# Patient Record
Sex: Female | Born: 1945 | Race: White | Hispanic: No | Smoking: Never smoker
Health system: Southern US, Community
[De-identification: ages and names within clinical notes are randomized; demographics above are authoritative.]

## PROBLEM LIST (undated history)

## (undated) DIAGNOSIS — E119 Type 2 diabetes mellitus without complications: Secondary | ICD-10-CM

## (undated) DIAGNOSIS — C801 Malignant (primary) neoplasm, unspecified: Secondary | ICD-10-CM

## (undated) DIAGNOSIS — R51 Headache: Secondary | ICD-10-CM

## (undated) DIAGNOSIS — H3530 Unspecified macular degeneration: Secondary | ICD-10-CM

## (undated) DIAGNOSIS — M1990 Unspecified osteoarthritis, unspecified site: Secondary | ICD-10-CM

## (undated) DIAGNOSIS — I10 Essential (primary) hypertension: Secondary | ICD-10-CM

## (undated) DIAGNOSIS — K219 Gastro-esophageal reflux disease without esophagitis: Secondary | ICD-10-CM

## (undated) DIAGNOSIS — R519 Headache, unspecified: Secondary | ICD-10-CM

## (undated) DIAGNOSIS — C50919 Malignant neoplasm of unspecified site of unspecified female breast: Secondary | ICD-10-CM

## (undated) DIAGNOSIS — Z9221 Personal history of antineoplastic chemotherapy: Secondary | ICD-10-CM

## (undated) HISTORY — PX: PALATE / UVULA BIOPSY / EXCISION: SUR128

## (undated) HISTORY — DX: Gastro-esophageal reflux disease without esophagitis: K21.9

## (undated) HISTORY — DX: Essential (primary) hypertension: I10

## (undated) HISTORY — DX: Unspecified macular degeneration: H35.30

## (undated) HISTORY — PX: US CAROTID DOPPLER BILATERAL (ARMC HX): HXRAD1402

## (undated) HISTORY — PX: MASTECTOMY: SHX3

## (undated) HISTORY — DX: Malignant (primary) neoplasm, unspecified: C80.1

## (undated) HISTORY — PX: NECK SURGERY: SHX720

## (undated) HISTORY — DX: Unspecified osteoarthritis, unspecified site: M19.90

## (undated) HISTORY — PX: DILATION AND CURETTAGE OF UTERUS: SHX78

---

## 1983-07-04 DIAGNOSIS — C50919 Malignant neoplasm of unspecified site of unspecified female breast: Secondary | ICD-10-CM

## 1983-07-04 HISTORY — DX: Malignant neoplasm of unspecified site of unspecified female breast: C50.919

## 2004-12-16 ENCOUNTER — Ambulatory Visit: Payer: Self-pay | Admitting: Family Medicine

## 2005-01-27 ENCOUNTER — Ambulatory Visit: Payer: Self-pay | Admitting: Gastroenterology

## 2005-02-16 ENCOUNTER — Ambulatory Visit: Payer: Self-pay | Admitting: Gastroenterology

## 2006-07-19 DIAGNOSIS — Z86010 Personal history of colonic polyps: Secondary | ICD-10-CM | POA: Insufficient documentation

## 2006-08-15 ENCOUNTER — Ambulatory Visit: Payer: Self-pay | Admitting: Family Medicine

## 2006-10-08 DIAGNOSIS — M129 Arthropathy, unspecified: Secondary | ICD-10-CM | POA: Insufficient documentation

## 2007-06-10 ENCOUNTER — Ambulatory Visit: Payer: Self-pay | Admitting: Family Medicine

## 2007-08-19 ENCOUNTER — Ambulatory Visit: Payer: Self-pay | Admitting: Family Medicine

## 2008-09-21 ENCOUNTER — Ambulatory Visit: Payer: Self-pay | Admitting: Family Medicine

## 2008-09-21 DIAGNOSIS — E559 Vitamin D deficiency, unspecified: Secondary | ICD-10-CM | POA: Insufficient documentation

## 2010-02-02 ENCOUNTER — Ambulatory Visit: Payer: Self-pay | Admitting: Family Medicine

## 2010-02-23 ENCOUNTER — Ambulatory Visit: Payer: Self-pay | Admitting: Gastroenterology

## 2010-02-23 LAB — HM COLONOSCOPY

## 2010-02-25 LAB — PATHOLOGY REPORT

## 2011-02-08 ENCOUNTER — Ambulatory Visit: Payer: Self-pay | Admitting: Family Medicine

## 2011-02-28 ENCOUNTER — Ambulatory Visit: Payer: Self-pay | Admitting: Family Medicine

## 2011-10-05 DIAGNOSIS — H6690 Otitis media, unspecified, unspecified ear: Secondary | ICD-10-CM | POA: Diagnosis not present

## 2011-10-05 DIAGNOSIS — Z1331 Encounter for screening for depression: Secondary | ICD-10-CM | POA: Diagnosis not present

## 2011-10-05 DIAGNOSIS — J069 Acute upper respiratory infection, unspecified: Secondary | ICD-10-CM | POA: Diagnosis not present

## 2011-10-27 DIAGNOSIS — H6980 Other specified disorders of Eustachian tube, unspecified ear: Secondary | ICD-10-CM | POA: Diagnosis not present

## 2011-10-27 DIAGNOSIS — H6500 Acute serous otitis media, unspecified ear: Secondary | ICD-10-CM | POA: Diagnosis not present

## 2011-11-17 DIAGNOSIS — H6980 Other specified disorders of Eustachian tube, unspecified ear: Secondary | ICD-10-CM | POA: Diagnosis not present

## 2012-03-12 LAB — LIPID PANEL
Cholesterol: 174 mg/dL (ref 0–200)
HDL: 62 mg/dL (ref 35–70)
LDL CALC: 99 mg/dL
LDl/HDL Ratio: 1.6
TRIGLYCERIDES: 60 mg/dL (ref 40–160)

## 2012-03-12 LAB — BASIC METABOLIC PANEL
BUN: 21 mg/dL (ref 4–21)
Creatinine: 0.8 mg/dL (ref <=1.1)
GLUCOSE: 112 mg/dL
Potassium: 5.2 mmol/L (ref 3.4–5.3)
SODIUM: 141 mmol/L (ref 137–147)

## 2012-03-12 LAB — CBC AND DIFFERENTIAL
HEMATOCRIT: 45 % (ref 36–46)
Hemoglobin: 15.1 g/dL (ref 12.0–16.0)
NEUTROS ABS: 54 /uL
PLATELETS: 214 10*3/uL (ref 150–399)
WBC: 6.1 10*3/mL

## 2012-03-12 LAB — HEPATIC FUNCTION PANEL
ALT: 20 U/L (ref 7–35)
AST: 22 U/L (ref 13–35)
Alkaline Phosphatase: 83 U/L (ref 25–125)
BILIRUBIN, TOTAL: 0.3 mg/dL

## 2012-03-12 LAB — TSH: TSH: 4.22 u[IU]/mL (ref <=5.90)

## 2012-03-13 DIAGNOSIS — R002 Palpitations: Secondary | ICD-10-CM | POA: Diagnosis not present

## 2012-03-13 DIAGNOSIS — R55 Syncope and collapse: Secondary | ICD-10-CM | POA: Diagnosis not present

## 2012-03-26 ENCOUNTER — Ambulatory Visit: Payer: Self-pay | Admitting: Family Medicine

## 2012-03-28 DIAGNOSIS — R002 Palpitations: Secondary | ICD-10-CM | POA: Diagnosis not present

## 2012-05-08 DIAGNOSIS — E559 Vitamin D deficiency, unspecified: Secondary | ICD-10-CM | POA: Diagnosis not present

## 2012-05-08 DIAGNOSIS — Z853 Personal history of malignant neoplasm of breast: Secondary | ICD-10-CM | POA: Diagnosis not present

## 2012-05-08 DIAGNOSIS — R55 Syncope and collapse: Secondary | ICD-10-CM | POA: Diagnosis not present

## 2012-05-08 DIAGNOSIS — M129 Arthropathy, unspecified: Secondary | ICD-10-CM | POA: Diagnosis not present

## 2014-01-30 DIAGNOSIS — H6120 Impacted cerumen, unspecified ear: Secondary | ICD-10-CM | POA: Diagnosis not present

## 2014-01-30 DIAGNOSIS — H93299 Other abnormal auditory perceptions, unspecified ear: Secondary | ICD-10-CM | POA: Diagnosis not present

## 2014-01-30 DIAGNOSIS — H6980 Other specified disorders of Eustachian tube, unspecified ear: Secondary | ICD-10-CM | POA: Diagnosis not present

## 2014-02-19 LAB — HM PAP SMEAR: HM PAP: NORMAL

## 2014-03-31 ENCOUNTER — Ambulatory Visit: Payer: Self-pay | Admitting: Family Medicine

## 2014-03-31 DIAGNOSIS — M899 Disorder of bone, unspecified: Secondary | ICD-10-CM | POA: Diagnosis not present

## 2014-03-31 DIAGNOSIS — M949 Disorder of cartilage, unspecified: Secondary | ICD-10-CM | POA: Diagnosis not present

## 2014-03-31 DIAGNOSIS — Z13820 Encounter for screening for osteoporosis: Secondary | ICD-10-CM | POA: Diagnosis not present

## 2014-03-31 LAB — HM DEXA SCAN

## 2014-04-07 ENCOUNTER — Ambulatory Visit: Payer: Self-pay | Admitting: Family Medicine

## 2014-04-07 LAB — HM MAMMOGRAPHY: HM Mammogram: NORMAL

## 2014-04-22 ENCOUNTER — Ambulatory Visit: Payer: Self-pay | Admitting: Family Medicine

## 2014-04-22 DIAGNOSIS — M79671 Pain in right foot: Secondary | ICD-10-CM | POA: Diagnosis not present

## 2014-04-22 DIAGNOSIS — M722 Plantar fascial fibromatosis: Secondary | ICD-10-CM | POA: Diagnosis not present

## 2014-04-22 DIAGNOSIS — K219 Gastro-esophageal reflux disease without esophagitis: Secondary | ICD-10-CM | POA: Diagnosis not present

## 2014-05-05 DIAGNOSIS — C50919 Malignant neoplasm of unspecified site of unspecified female breast: Secondary | ICD-10-CM | POA: Diagnosis not present

## 2014-05-05 DIAGNOSIS — M728 Other fibroblastic disorders: Secondary | ICD-10-CM | POA: Diagnosis not present

## 2014-05-19 DIAGNOSIS — M65872 Other synovitis and tenosynovitis, left ankle and foot: Secondary | ICD-10-CM | POA: Diagnosis not present

## 2014-05-19 DIAGNOSIS — C50919 Malignant neoplasm of unspecified site of unspecified female breast: Secondary | ICD-10-CM | POA: Insufficient documentation

## 2014-05-19 DIAGNOSIS — M76821 Posterior tibial tendinitis, right leg: Secondary | ICD-10-CM | POA: Diagnosis not present

## 2014-05-19 DIAGNOSIS — M722 Plantar fascial fibromatosis: Secondary | ICD-10-CM | POA: Insufficient documentation

## 2014-05-19 DIAGNOSIS — Z8669 Personal history of other diseases of the nervous system and sense organs: Secondary | ICD-10-CM | POA: Insufficient documentation

## 2014-06-09 DIAGNOSIS — M79671 Pain in right foot: Secondary | ICD-10-CM | POA: Diagnosis not present

## 2014-06-09 DIAGNOSIS — M76821 Posterior tibial tendinitis, right leg: Secondary | ICD-10-CM | POA: Diagnosis not present

## 2014-06-09 DIAGNOSIS — M65872 Other synovitis and tenosynovitis, left ankle and foot: Secondary | ICD-10-CM | POA: Diagnosis not present

## 2014-06-30 DIAGNOSIS — H25011 Cortical age-related cataract, right eye: Secondary | ICD-10-CM | POA: Diagnosis not present

## 2014-06-30 DIAGNOSIS — H2511 Age-related nuclear cataract, right eye: Secondary | ICD-10-CM | POA: Diagnosis not present

## 2014-06-30 DIAGNOSIS — H02839 Dermatochalasis of unspecified eye, unspecified eyelid: Secondary | ICD-10-CM | POA: Diagnosis not present

## 2014-06-30 DIAGNOSIS — H18411 Arcus senilis, right eye: Secondary | ICD-10-CM | POA: Diagnosis not present

## 2014-06-30 DIAGNOSIS — H3531 Nonexudative age-related macular degeneration: Secondary | ICD-10-CM | POA: Diagnosis not present

## 2014-07-01 DIAGNOSIS — M65872 Other synovitis and tenosynovitis, left ankle and foot: Secondary | ICD-10-CM | POA: Diagnosis not present

## 2014-07-01 DIAGNOSIS — M79671 Pain in right foot: Secondary | ICD-10-CM | POA: Diagnosis not present

## 2014-07-22 DIAGNOSIS — M65871 Other synovitis and tenosynovitis, right ankle and foot: Secondary | ICD-10-CM | POA: Diagnosis not present

## 2014-08-24 DIAGNOSIS — H2512 Age-related nuclear cataract, left eye: Secondary | ICD-10-CM | POA: Diagnosis not present

## 2014-08-24 DIAGNOSIS — H2513 Age-related nuclear cataract, bilateral: Secondary | ICD-10-CM | POA: Diagnosis not present

## 2014-08-24 DIAGNOSIS — H25812 Combined forms of age-related cataract, left eye: Secondary | ICD-10-CM | POA: Diagnosis not present

## 2014-09-04 DIAGNOSIS — H2511 Age-related nuclear cataract, right eye: Secondary | ICD-10-CM | POA: Diagnosis not present

## 2014-09-04 DIAGNOSIS — H25811 Combined forms of age-related cataract, right eye: Secondary | ICD-10-CM | POA: Diagnosis not present

## 2014-10-06 DIAGNOSIS — H2513 Age-related nuclear cataract, bilateral: Secondary | ICD-10-CM | POA: Diagnosis not present

## 2014-10-07 DIAGNOSIS — M1990 Unspecified osteoarthritis, unspecified site: Secondary | ICD-10-CM | POA: Diagnosis not present

## 2014-10-07 DIAGNOSIS — M7550 Bursitis of unspecified shoulder: Secondary | ICD-10-CM | POA: Diagnosis not present

## 2014-11-04 DIAGNOSIS — IMO0001 Reserved for inherently not codable concepts without codable children: Secondary | ICD-10-CM | POA: Insufficient documentation

## 2014-11-04 DIAGNOSIS — M722 Plantar fascial fibromatosis: Secondary | ICD-10-CM | POA: Insufficient documentation

## 2014-11-04 DIAGNOSIS — K219 Gastro-esophageal reflux disease without esophagitis: Secondary | ICD-10-CM | POA: Insufficient documentation

## 2014-11-04 DIAGNOSIS — C50919 Malignant neoplasm of unspecified site of unspecified female breast: Secondary | ICD-10-CM | POA: Insufficient documentation

## 2014-11-04 DIAGNOSIS — R030 Elevated blood-pressure reading, without diagnosis of hypertension: Secondary | ICD-10-CM

## 2014-11-09 ENCOUNTER — Other Ambulatory Visit: Payer: Self-pay | Admitting: Family Medicine

## 2014-11-09 ENCOUNTER — Ambulatory Visit
Admission: RE | Admit: 2014-11-09 | Discharge: 2014-11-09 | Disposition: A | Discharge status: Home / Self Care | Payer: BC Managed Care – PPO | Source: Ambulatory Visit | Attending: Family Medicine | Admitting: Family Medicine

## 2014-11-09 DIAGNOSIS — M25462 Effusion, left knee: Secondary | ICD-10-CM | POA: Diagnosis not present

## 2014-11-09 DIAGNOSIS — M1712 Unilateral primary osteoarthritis, left knee: Secondary | ICD-10-CM | POA: Diagnosis not present

## 2014-11-09 DIAGNOSIS — M25562 Pain in left knee: Secondary | ICD-10-CM

## 2014-11-26 DIAGNOSIS — M170 Bilateral primary osteoarthritis of knee: Secondary | ICD-10-CM | POA: Diagnosis not present

## 2014-12-17 ENCOUNTER — Ambulatory Visit (INDEPENDENT_AMBULATORY_CARE_PROVIDER_SITE_OTHER): Payer: BC Managed Care – PPO | Admitting: Family Medicine

## 2014-12-17 ENCOUNTER — Encounter: Payer: Self-pay | Admitting: Family Medicine

## 2014-12-17 VITALS — BP 194/90 | HR 72 | Temp 97.8°F | Resp 16 | Wt 162.0 lb

## 2014-12-17 DIAGNOSIS — I10 Essential (primary) hypertension: Secondary | ICD-10-CM | POA: Diagnosis not present

## 2014-12-17 MED ORDER — LISINOPRIL-HYDROCHLOROTHIAZIDE 10-12.5 MG PO TABS: 1.0000 | ORAL_TABLET | Freq: Every day | ORAL | Status: DC

## 2014-12-17 NOTE — Progress Notes (Signed)
Patient ID: Erin Middleton, female   DOB: 05-21-1946, 69 y.o.   MRN: [id number]   Erin Middleton  MRN: [id number] DOB: 10-15-1945  Subjective:  Hypertension This is a chronic problem. The current episode started more than 1 month ago. The problem is unchanged. The problem is uncontrolled. Associated symptoms include malaise/fatigue and shortness of breath (Patient is not able to walk well and has not been able to do her exercising in several months). (Patient is having a lot of knee pain) Past treatments include nothing.      Patient Active Problem List   Diagnosis Date Noted  . Breast CA 11/04/2014  . Acid reflux 11/04/2014  . Blood pressure elevated 11/04/2014  . Plantar fasciitis 11/04/2014  . Avitaminosis D 09/21/2008  . 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 History of colon polyps 07/19/2006    Past Medical History  Diagnosis Date  . Arthritis   . Cancer     Breast  . GERD (gastroesophageal reflux disease)   . Hypertension     History   Social History  . Marital Status: Married    Spouse Name: N/A  . Number of Children: N/A  . Years of Education: N/A   Occupational History  . Not on file.   Social History Main Topics  . Smoking status: Never Smoker   . Smokeless tobacco: Not on file  . Alcohol Use: No  . Drug Use: No  . Sexual Activity: Not on file   Other Topics Concern  . Not on file   Social History Narrative    Outpatient Prescriptions Prior to Visit  Medication Sig Dispense Refill  . calcium gluconate 650 MG tablet Take 650 mg by mouth daily.    . cholecalciferol (VITAMIN D) 1000 UNITS tablet Take by mouth.    . Multiple Vitamins-Minerals (PRESERVISION/LUTEIN) CAPS Take by mouth.    Marland Kitchen omeprazole (PRILOSEC) 40 MG capsule Take by mouth.    . vitamin E 400 UNIT capsule Take by mouth.    . naproxen (NAPROSYN) 500 MG tablet Take by mouth.     No facility-administered medications prior to visit.    No Known Allergies  Review of Systems  Constitutional:  Positive for malaise/fatigue.  Eyes: Negative.   Respiratory: Positive for shortness of breath (Patient is not able to walk well and has not been able to do her exercising in several months).   Cardiovascular: Negative.   Gastrointestinal: Negative.   Musculoskeletal: Positive for joint pain.  Neurological: Negative.   Endo/Heme/Allergies: Negative.   Psychiatric/Behavioral: Negative.    Objective:  BP 194/90 mmHg  Pulse 72  Temp(Src) 97.8 F (36.6 C) (Oral)  Resp 16  Wt 162 lb (73.483 kg)  Physical Exam  Constitutional: She is oriented to person, place, and time and well-developed, well-nourished, and in no distress.  HENT:  Head: Normocephalic and atraumatic.  Right Ear: External ear normal.  Left Ear: External ear normal.  Mouth/Throat: Oropharynx is clear and moist.  Eyes: Conjunctivae and EOM are normal. Pupils are equal, round, and reactive to light.  Neck: Normal range of motion. Neck supple.  Cardiovascular: Normal rate, regular rhythm, normal heart sounds and intact distal pulses.   Pulmonary/Chest: Effort normal and breath sounds normal.  Abdominal: Soft. Bowel sounds are normal.  Musculoskeletal: Normal range of motion.  Neurological: She is alert and oriented to person, place, and time. Gait normal.  Skin: Skin is warm and dry.  Psychiatric: Mood, memory, affect and judgment normal.    Assessment and Plan :  Essential hypertension  Treated with lisinopril HCT 10/12.5 every morning. return to clinic 1 month.  History of breast cancer Miguel Aschoff MD [facility] 12/17/2014 4:31 PM

## 2014-12-24 ENCOUNTER — Other Ambulatory Visit: Payer: Self-pay | Admitting: Specialist

## 2014-12-24 DIAGNOSIS — M170 Bilateral primary osteoarthritis of knee: Secondary | ICD-10-CM

## 2014-12-25 ENCOUNTER — Ambulatory Visit
Admission: RE | Admit: 2014-12-25 | Discharge: 2014-12-25 | Disposition: A | Discharge status: Home / Self Care | Payer: BC Managed Care – PPO | Source: Ambulatory Visit | Attending: Specialist | Admitting: Specialist

## 2014-12-25 DIAGNOSIS — M170 Bilateral primary osteoarthritis of knee: Secondary | ICD-10-CM | POA: Insufficient documentation

## 2014-12-25 DIAGNOSIS — M25562 Pain in left knee: Secondary | ICD-10-CM | POA: Diagnosis not present

## 2014-12-30 DIAGNOSIS — M170 Bilateral primary osteoarthritis of knee: Secondary | ICD-10-CM | POA: Diagnosis not present

## 2015-01-18 ENCOUNTER — Encounter: Payer: Self-pay | Admitting: Family Medicine

## 2015-01-18 ENCOUNTER — Ambulatory Visit (INDEPENDENT_AMBULATORY_CARE_PROVIDER_SITE_OTHER): Payer: BC Managed Care – PPO | Admitting: Family Medicine

## 2015-01-18 VITALS — BP 142/78 | HR 78 | Temp 97.4°F | Resp 16 | Wt 162.0 lb

## 2015-01-18 DIAGNOSIS — R059 Cough, unspecified: Secondary | ICD-10-CM

## 2015-01-18 DIAGNOSIS — I10 Essential (primary) hypertension: Secondary | ICD-10-CM

## 2015-01-18 DIAGNOSIS — R05 Cough: Secondary | ICD-10-CM

## 2015-01-18 NOTE — Progress Notes (Signed)
Patient ID: Erin Middleton, female   DOB: 17-Oct-1945, 69 y.o.   MRN: [id number]    Subjective:  HPI  Hypertension, follow-up:  BP Readings from Last 3 Encounters:  01/18/15 142/78  12/17/14 194/90  10/07/14 164/72    She was last seen for hypertension 1 months ago.  BP at that visit was 164/72. Management changes since that visit include Started Lisinopril-HCTZ. She reports good compliance with treatment. She is having side effects. She reports a dry cough.  Outside blood pressures are not being checked. She is experiencing none.     Weight trend: stable Wt Readings from Last 3 Encounters:  01/18/15 162 lb (73.483 kg)  12/25/14 164 lb (74.39 kg)  12/17/14 162 lb (73.483 kg)    ------------------------------------------------------------------------     Prior to Admission medications   Medication Sig Start Date End Date Taking? Authorizing Provider  calcium gluconate 650 MG tablet Take 650 mg by mouth daily.   Yes Historical Provider, MD  cholecalciferol (VITAMIN D) 1000 UNITS tablet Take by mouth.   Yes Historical Provider, MD  lisinopril-hydrochlorothiazide (PRINZIDE,ZESTORETIC) 10-12.5 MG per tablet Take 1 tablet by mouth daily. 12/17/14  Yes Deyona Soza Maceo Pro., MD  meloxicam (MOBIC) 15 MG tablet Take 15 mg by mouth daily. 11/26/14  Yes Historical Provider, MD  Multiple Vitamins-Minerals (PRESERVISION/LUTEIN) CAPS Take by mouth.   Yes Historical Provider, MD  omeprazole (PRILOSEC) 40 MG capsule Take by mouth. 04/22/14  Yes Historical Provider, MD  vitamin E 400 UNIT capsule Take by mouth.   Yes Historical Provider, MD    Patient Active Problem List   Diagnosis Date Noted  . Hypertension 01/18/2015  . Breast CA 11/04/2014  . Acid reflux 11/04/2014  . Blood pressure elevated 11/04/2014  . Plantar fasciitis 11/04/2014  . H/O ear disorder 05/19/2014  . Breast cancer, female 05/19/2014  . Dupuytren's contracture of foot 05/19/2014  . Avitaminosis D 09/21/2008  .  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 History of colon polyps 07/19/2006    Past Medical History  Diagnosis Date  . Arthritis   . Cancer     Breast  . GERD (gastroesophageal reflux disease)   . Hypertension     History   Social History  . Marital Status: Married    Spouse Name: N/A  . Number of Children: N/A  . Years of Education: N/A   Occupational History  . Not on file.   Social History Main Topics  . Smoking status: Never Smoker   . Smokeless tobacco: Not on file  . Alcohol Use: No  . Drug Use: No  . Sexual Activity: Not on file   Other Topics Concern  . Not on file   Social History Narrative    No Known Allergies  Review of Systems  Constitutional: Negative.   Eyes: Negative.   Respiratory: Positive for cough.   Cardiovascular: Negative.   Gastrointestinal: Negative.   Genitourinary: Negative.   Musculoskeletal: Negative.   Skin: Negative.   Neurological: Negative.   Endo/Heme/Allergies: Negative.   Psychiatric/Behavioral: Negative.     Immunization History  Administered Date(s) Administered  . Pneumococcal Polysaccharide-23 03/06/2012  . Td 01/23/2003   Objective:  BP 142/78 mmHg  Pulse 78  Temp(Src) 97.4 F (36.3 C) (Oral)  Resp 16  Wt 162 lb (73.483 kg)  Physical Exam  Constitutional: She is oriented to person, place, and time and well-developed, well-nourished, and in no distress.  HENT:  Head: Normocephalic and atraumatic.  Right Ear: External ear normal.  Left Ear:  External ear normal.  Nose: Nose normal.  Eyes: Conjunctivae and EOM are normal. Pupils are equal, round, and reactive to light.  Neck: Normal range of motion. Neck supple.  Cardiovascular: Normal rate, regular rhythm, normal heart sounds and intact distal pulses.   Pulmonary/Chest: Effort normal and breath sounds normal.  Abdominal: Soft. Bowel sounds are normal.  Neurological: She is alert and oriented to person, place, and time. She has normal reflexes. Gait normal. GCS score  is 15.  Skin: Skin is warm and dry.  Psychiatric: Mood, memory, affect and judgment normal.   increased thoracic kyphosis noted.  Lab Results  Component Value Date   WBC 6.1 03/12/2012   HGB 15.1 03/12/2012   HCT 45 03/12/2012   PLT 214 03/12/2012   CHOL 174 03/12/2012   TRIG 60 03/12/2012   HDL 62 03/12/2012   LDLCALC 99 03/12/2012   TSH 4.22 03/12/2012    CMP     Component Value Date/Time   NA 141 03/12/2012   K 5.2 03/12/2012   BUN 21 03/12/2012   CREATININE 0.8 03/12/2012   AST 22 03/12/2012   ALT 20 03/12/2012   ALKPHOS 83 03/12/2012    Assessment and Plan :  1. Essential hypertension  - CBC with Differential/Platelet - COMPLETE METABOLIC PANEL WITH GFR - TSH  2. Cough Cause by Lisinopril-- Pt wants to continue to take to see if this goes away in the next couple of weeks. If not will change to HCTZ 25 mg alone. Recheck at CPE in 1 month.  3. Breast cancer  Patient was seen and examined by Dr. Miguel Aschoff, and noted scribed by Webb Laws, [place] MD [facility] 01/18/2015 1:51 PM

## 2015-01-18 NOTE — Patient Instructions (Signed)
Continue the Lisinopril-HCTZ and see if the cough goes away, if not or becomes more bothersome. Let us know.

## 2015-01-19 LAB — COMPREHENSIVE METABOLIC PANEL
ALT: 18 IU/L (ref 0–32)
AST: 23 IU/L (ref 0–40)
Albumin/Globulin Ratio: 1.6 (ref 1.1–2.5)
Albumin: 4.2 g/dL (ref 3.5–4.8)
Alkaline Phosphatase: 89 IU/L (ref 39–117)
BILIRUBIN TOTAL: 0.5 mg/dL (ref 0.0–1.2)
BUN / CREAT RATIO: 24 (ref 11–26)
BUN: 22 mg/dL (ref 8–27)
CO2: 26 mmol/L (ref 18–29)
CREATININE: 0.93 mg/dL (ref 0.57–1.00)
Calcium: 9.3 mg/dL (ref 8.7–10.3)
Chloride: 99 mmol/L (ref 97–108)
GFR calc non Af Amer: 62 mL/min/{1.73_m2} (ref >59)
GFR, EST AFRICAN AMERICAN: 72 mL/min/{1.73_m2} (ref >59)
GLOBULIN, TOTAL: 2.6 g/dL (ref 1.5–4.5)
Glucose: 92 mg/dL (ref 65–99)
Potassium: 4.1 mmol/L (ref 3.5–5.2)
SODIUM: 141 mmol/L (ref 134–144)
TOTAL PROTEIN: 6.8 g/dL (ref 6.0–8.5)

## 2015-01-19 LAB — CBC WITH DIFFERENTIAL/PLATELET
BASOS ABS: 0 10*3/uL (ref 0.0–0.2)
BASOS: 0 %
EOS (ABSOLUTE): 0.4 10*3/uL (ref 0.0–0.4)
Eos: 6 %
HEMATOCRIT: 40.7 % (ref 34.0–46.6)
Hemoglobin: 13.4 g/dL (ref 11.1–15.9)
Immature Grans (Abs): 0 10*3/uL (ref 0.0–0.1)
Immature Granulocytes: 0 %
Lymphocytes Absolute: 2.4 10*3/uL (ref 0.7–3.1)
Lymphs: 31 %
MCH: 31.3 pg (ref 26.6–33.0)
MCHC: 32.9 g/dL (ref 31.5–35.7)
MCV: 95 fL (ref 79–97)
MONOS ABS: 0.5 10*3/uL (ref 0.1–0.9)
Monocytes: 7 %
NEUTROS ABS: 4.2 10*3/uL (ref 1.4–7.0)
NEUTROS PCT: 56 %
PLATELETS: 228 10*3/uL (ref 150–379)
RBC: 4.28 x10E6/uL (ref 3.77–5.28)
RDW: 13.2 % (ref 12.3–15.4)
WBC: 7.6 10*3/uL (ref 3.4–10.8)

## 2015-01-19 LAB — TSH: TSH: 2.31 u[IU]/mL (ref 0.450–4.500)

## 2015-01-21 ENCOUNTER — Telehealth: Payer: Self-pay

## 2015-01-21 NOTE — Telephone Encounter (Signed)
Advised  ED 

## 2015-01-21 NOTE — Telephone Encounter (Signed)
-----   Message from Jerrol Banana., MD sent at 01/21/2015  7:19 AM EDT ----- Labs okay.

## 2015-02-01 ENCOUNTER — Telehealth: Payer: Self-pay

## 2015-02-01 NOTE — Telephone Encounter (Signed)
Pt was advised on 01/21/15 of Labs results.

## 2015-02-01 NOTE — Telephone Encounter (Signed)
Labs were ok, Dr Darnell Level may not have routed this properly and patient may not have been informed.  ED

## 2015-02-23 ENCOUNTER — Encounter: Payer: Self-pay | Admitting: Family Medicine

## 2015-02-23 ENCOUNTER — Ambulatory Visit (INDEPENDENT_AMBULATORY_CARE_PROVIDER_SITE_OTHER): Payer: BC Managed Care – PPO | Admitting: Family Medicine

## 2015-02-23 VITALS — BP 140/58 | HR 76 | Temp 97.5°F | Resp 16 | Ht 62.5 in | Wt 163.0 lb

## 2015-02-23 DIAGNOSIS — D126 Benign neoplasm of colon, unspecified: Secondary | ICD-10-CM | POA: Diagnosis not present

## 2015-02-23 DIAGNOSIS — Z0000 Encounter for general adult medical examination without abnormal findings: Secondary | ICD-10-CM

## 2015-02-23 DIAGNOSIS — Z789 Other specified health status: Secondary | ICD-10-CM

## 2015-02-23 DIAGNOSIS — I10 Essential (primary) hypertension: Secondary | ICD-10-CM

## 2015-02-23 DIAGNOSIS — T464X1A Poisoning by angiotensin-converting-enzyme inhibitors, accidental (unintentional), initial encounter: Secondary | ICD-10-CM | POA: Diagnosis not present

## 2015-02-23 DIAGNOSIS — Z888 Allergy status to other drugs, medicaments and biological substances status: Secondary | ICD-10-CM

## 2015-02-23 MED ORDER — LOSARTAN POTASSIUM-HCTZ 50-12.5 MG PO TABS: 1.0000 | ORAL_TABLET | Freq: Every day | ORAL | Status: DC

## 2015-02-23 NOTE — Progress Notes (Signed)
Patient ID: SALSABEEL GORELICK, female   DOB: 1946/01/08, 69 y.o.   MRN: [id number] Patient: Erin Middleton, Female    DOB: 1945/11/11, 69 y.o.   MRN: [id number] Visit Date: 02/23/2015  Today's Provider: Wilhemena Durie, MD   Chief Complaint  Patient presents with  . Annual Exam   Subjective:   Erin Middleton is a 69 y.o. female who presents today for her Subsequent Annual Wellness Visit. She feels well. She reports exercising none. She reports she is sleeping well.  Review of Systems  Constitutional: Negative.   HENT: Negative.   Eyes: Negative.   Respiratory: Negative.   Gastrointestinal: Negative.   Endocrine: Negative.   Genitourinary: Negative.   Musculoskeletal: Negative.   Skin: Negative.   Allergic/Immunologic: Negative.   Neurological: Negative.   Hematological: Negative.   Psychiatric/Behavioral: Negative.     Patient Active Problem List   Diagnosis Date Noted  . Hypertension 01/18/2015  . Breast CA 11/04/2014  . Acid reflux 11/04/2014  . Blood pressure elevated 11/04/2014  . Plantar fasciitis 11/04/2014  . H/O ear disorder 05/19/2014  . Breast cancer, female 05/19/2014  . Dupuytren's contracture of foot 05/19/2014  . Avitaminosis D 09/21/2008  . 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 History of colon polyps 07/19/2006    Social History   Social History  . Marital Status: Married    Spouse Name: N/A  . Number of Children: N/A  . Years of Education: N/A   Occupational History  . Not on file.   Social History Main Topics  . Smoking status: Never Smoker   . Smokeless tobacco: Not on file  . Alcohol Use: No     Comment: very seldom  . Drug Use: No  . Sexual Activity: Not on file   Other Topics Concern  . Not on file   Social History Narrative    Past Surgical History  Procedure Laterality Date  . Mastectomy Left   . Dilation and curettage of uterus    . Neck surgery      neck deformity-straighten out neck as a child  . Palate / uvula biopsy /  excision      Her family history includes Dementia in her mother; Diabetes in her brother, brother, and mother; Heart disease in her father; Lung cancer in her father; Stroke in her brother and mother; Throat cancer in her brother.    Outpatient Prescriptions Prior to Visit  Medication Sig Dispense Refill  . calcium gluconate 650 MG tablet Take 650 mg by mouth daily.    . cholecalciferol (VITAMIN D) 1000 UNITS tablet Take by mouth.    Marland Kitchen lisinopril-hydrochlorothiazide (PRINZIDE,ZESTORETIC) 10-12.5 MG per tablet Take 1 tablet by mouth daily. 30 tablet 12  . meloxicam (MOBIC) 15 MG tablet Take 15 mg by mouth daily.  3  . Multiple Vitamins-Minerals (PRESERVISION/LUTEIN) CAPS Take by mouth.    Marland Kitchen omeprazole (PRILOSEC) 40 MG capsule Take by mouth.    . vitamin E 400 UNIT capsule Take by mouth.     No facility-administered medications prior to visit.    No Known Allergies  Patient Care Team: Jerrol Banana., MD as PCP - General (Family Medicine)  Objective:   Vitals:  Filed Vitals:   02/23/15 0917  BP: 140/58  Pulse: 76  Temp: 97.5 F (36.4 C)  TempSrc: Oral  Resp: 16  Height: 5' 2.5" (1.588 m)  Weight: 163 lb (73.936 kg)    Physical Exam  Constitutional: She is oriented to person, place, and  time. She appears well-developed and well-nourished.  HENT:  Head: Normocephalic and atraumatic.  Right Ear: External ear normal.  Left Ear: External ear normal.  Nose: Nose normal.  Mouth/Throat: Oropharynx is clear and moist.  Eyes: Conjunctivae and EOM are normal. Pupils are equal, round, and reactive to light.  Neck: Normal range of motion. Neck supple.  Cardiovascular: Normal rate, regular rhythm, normal heart sounds and intact distal pulses.   Pulmonary/Chest: Effort normal and breath sounds normal.  Abdominal: Soft. Bowel sounds are normal.  Musculoskeletal: Normal range of motion.  Neurological: She is alert and oriented to person, place, and time.  Skin: Skin is warm  and dry.  Psychiatric: She has a normal mood and affect. Her behavior is normal. Judgment and thought content normal.    Activities of Daily Living In your present state of health, do you have any difficulty performing the following activities: 02/23/2015  Vision? N  Difficulty concentrating or making decisions? N  Walking or climbing stairs? N  Dressing or bathing? N  Doing errands, shopping? N    Fall Risk Assessment Fall Risk  02/23/2015  Falls in the past year? Yes  Number falls in past yr: 1  Injury with Fall? No     Depression Screen PHQ 2/9 Scores 02/23/2015  PHQ - 2 Score 0    Cognitive Testing - 6-CIT    Year: 0 4 points  Month: 0 3 points  Memorize "Pia Mau, [address] [place]"  Time (within 1 hour:) 0 3 points  Count backwards from 20: 0 2 4 points  Name months of year: 0 2 4 points  Repeat Address: 0 2 4 6 8 10  points   Total Score: 0/28  Interpretation : Normal (0-7) Abnormal (8-28)    Assessment & Plan:     Annual Wellness Visit  Reviewed patient's Family Medical History Reviewed and updated list of patient's medical providers Assessment of cognitive impairment was done Assessed patient's functional ability Established a written schedule for health screening [place] Completed and Reviewed  Exercise Activities and Dietary recommendations Goals    None      Immunization History  Administered Date(s) Administered  . Pneumococcal Polysaccharide-23 03/06/2012  . Td 01/23/2003    Health Maintenance  Topic Date Due  . Hepatitis C Screening  [birth year]  . ZOSTAVAX  10/29/2003  . TETANUS/TDAP  01/22/2013  . PNA vac Low Risk Adult (2 of 2 - PCV13) 03/06/2013  . INFLUENZA VACCINE  02/01/2015  . MAMMOGRAM  04/07/2016  . COLONOSCOPY  02/24/2020  . DEXA SCAN  Completed      Discussed health benefits of physical activity, and encouraged her to engage in regular exercise appropriate for her age and condition.     Miguel Aschoff MD [facility] 02/23/2015 9:19 AM  ------------------------------------------------------------------------------------------------------------  1. Medicare annual wellness visit, subsequent   2. Essential hypertension Discontinue Lisinopril HCTZ due to side effect/intolerance. Change to generic losartan 50/HCTZ 12.5 3. ACE inhibitor intolerance Cough on ACE inhibitor  - losartan-hydrochlorothiazide (HYZAAR) 50-12.5 MG per tablet; Take 1 tablet by mouth daily.  Dispense: 90 tablet; Refill: 3  4. Adenoma of large intestine Time for screening colonoscopy. - Ambulatory referral to Gastroenterology

## 2015-03-16 DIAGNOSIS — K219 Gastro-esophageal reflux disease without esophagitis: Secondary | ICD-10-CM | POA: Diagnosis not present

## 2015-03-16 DIAGNOSIS — Z86010 Personal history of colonic polyps: Secondary | ICD-10-CM | POA: Diagnosis not present

## 2015-03-16 DIAGNOSIS — Z1211 Encounter for screening for malignant neoplasm of colon: Secondary | ICD-10-CM | POA: Diagnosis not present

## 2015-03-16 DIAGNOSIS — I10 Essential (primary) hypertension: Secondary | ICD-10-CM | POA: Diagnosis not present

## 2015-04-01 ENCOUNTER — Ambulatory Visit
Admission: RE | Admit: 2015-04-01 | Discharge: 2015-04-01 | Disposition: A | Discharge status: Home / Self Care | Payer: BC Managed Care – PPO | Source: Ambulatory Visit | Attending: Gastroenterology | Admitting: Gastroenterology

## 2015-04-01 ENCOUNTER — Ambulatory Visit: Payer: BC Managed Care – PPO | Admitting: Anesthesiology

## 2015-04-01 ENCOUNTER — Encounter: Payer: Self-pay | Admitting: Anesthesiology

## 2015-04-01 ENCOUNTER — Encounter
Admission: RE | Disposition: A | Discharge status: Home / Self Care | Payer: Self-pay | Source: Ambulatory Visit | Attending: Gastroenterology

## 2015-04-01 DIAGNOSIS — Z09 Encounter for follow-up examination after completed treatment for conditions other than malignant neoplasm: Secondary | ICD-10-CM | POA: Diagnosis not present

## 2015-04-01 DIAGNOSIS — I10 Essential (primary) hypertension: Secondary | ICD-10-CM | POA: Insufficient documentation

## 2015-04-01 DIAGNOSIS — Z86010 Personal history of colonic polyps: Secondary | ICD-10-CM | POA: Diagnosis not present

## 2015-04-01 DIAGNOSIS — M069 Rheumatoid arthritis, unspecified: Secondary | ICD-10-CM | POA: Diagnosis not present

## 2015-04-01 DIAGNOSIS — Z853 Personal history of malignant neoplasm of breast: Secondary | ICD-10-CM | POA: Diagnosis not present

## 2015-04-01 DIAGNOSIS — K219 Gastro-esophageal reflux disease without esophagitis: Secondary | ICD-10-CM | POA: Insufficient documentation

## 2015-04-01 DIAGNOSIS — Z79899 Other long term (current) drug therapy: Secondary | ICD-10-CM | POA: Insufficient documentation

## 2015-04-01 DIAGNOSIS — K5730 Diverticulosis of large intestine without perforation or abscess without bleeding: Secondary | ICD-10-CM | POA: Diagnosis not present

## 2015-04-01 HISTORY — PX: COLONOSCOPY WITH PROPOFOL: SHX5780

## 2015-04-01 SURGERY — COLONOSCOPY WITH PROPOFOL
Anesthesia: General

## 2015-04-01 MED ORDER — LIDOCAINE HCL (CARDIAC) 20 MG/ML IV SOLN
INTRAVENOUS | Status: DC | PRN
  Administered 2015-04-01: 20 mg via INTRAVENOUS

## 2015-04-01 MED ORDER — SODIUM CHLORIDE 0.9 % IV SOLN
INTRAVENOUS | Status: DC
  Administered 2015-04-01: 10:00:00 via INTRAVENOUS

## 2015-04-01 MED ORDER — PROPOFOL 500 MG/50ML IV EMUL
INTRAVENOUS | Status: DC | PRN
  Administered 2015-04-01: 125 ug/kg/min via INTRAVENOUS

## 2015-04-01 MED ORDER — PROPOFOL 10 MG/ML IV BOLUS
INTRAVENOUS | Status: DC | PRN
  Administered 2015-04-01: 30 mg via INTRAVENOUS
  Administered 2015-04-01: 70 mg via INTRAVENOUS

## 2015-04-01 MED ORDER — SODIUM CHLORIDE 0.9 % IV SOLN: INTRAVENOUS | Status: DC

## 2015-04-01 NOTE — Op Note (Signed)
[facility] ([facility] Gastroenterology Patient Name: Erin Middleton Procedure Date: 04/01/2015 10:44 AM MRN: [id number] Account #: [id number] Date of Birth: 11-05-1945 Admit Type: Outpatient Age: 69 Room: [facility] ENDO ROOM 4 Gender: Female Note Status: Finalized Procedure:         Colonoscopy Indications:       Personal history of colonic polyps Providers:         Lupita Dawn. Candace Cruise, MD Referring MD:      Janine Ores. Rosanna [name], MD (Referring MD) Medicines:         Monitored Anesthesia Care Complications:     No immediate complications. Procedure:         Pre-Anesthesia Assessment:                    - Prior to the procedure, a History and Physical was                     performed, and patient medications, allergies and                     sensitivities were reviewed. The patient's tolerance of                     previous anesthesia was reviewed.                    - The risks and benefits of the procedure and the sedation                     options and risks were discussed with the patient. All                     questions were answered and informed consent was obtained.                    - After reviewing the risks and benefits, the patient was                     deemed in satisfactory condition to undergo the procedure.                    After obtaining informed consent, the colonoscope was                     passed under direct vision. Throughout the procedure, the                     patient's blood pressure, pulse, and oxygen saturations                     were monitored continuously. The Olympus CF-Q160AL                     colonoscope (S#. [id number]) was introduced through the anus                     and advanced to the the cecum, identified by appendiceal                     orifice and ileocecal valve. The colonoscopy was performed                     without difficulty. The patient tolerated the procedure  well. The quality of the bowel preparation  was good. Findings:      A few small and large-mouthed diverticula were found in the ascending       colon.      The exam was otherwise without abnormality. Impression:        - Diverticulosis in the ascending colon.                    - The examination was otherwise normal.                    - No specimens collected. Recommendation:    - Discharge patient to home.                    - Repeat colonoscopy in 5 years for surveillance.                    - The findings and recommendations were discussed with the                     patient. Procedure Code(s): --- Professional ---                    [phone], Colonoscopy, flexible; diagnostic, including                     collection of specimen(s) by brushing or washing, when                     performed (separate procedure) Diagnosis Code(s): --- Professional ---                    Z86.010, Personal history of colonic polyps                    K57.30, Diverticulosis of large intestine without                     perforation or abscess without bleeding CPT copyright 2014 American Medical Association. All rights reserved. The codes documented in this report are preliminary and upon coder review may  be revised to meet current compliance requirements. Hulen Luster, MD 04/01/2015 11:00:31 AM This report has been signed electronically. Number of Addenda: 0 Note Initiated On: 04/01/2015 10:44 AM Scope Withdrawal Time: 0 hours 6 minutes 13 seconds  Total Procedure Duration: 0 hours 10 minutes 26 seconds       [facility]

## 2015-04-01 NOTE — Transfer of Care (Signed)
Immediate Anesthesia Transfer of Care Note  Patient: Erin Middleton  Procedure(s) Performed: Procedure(s): COLONOSCOPY WITH PROPOFOL (N/A)  Patient Location: Endoscopy Unit  Anesthesia Type:General  Level of Consciousness: sedated  Airway & Oxygen Therapy: Patient Spontanous Breathing and Patient connected to nasal cannula oxygen  Post-op Assessment: Report given to RN and Post -op Vital signs reviewed and stable  Post vital signs: Reviewed and stable  Last Vitals: There were no vitals filed for this visit.  Complications: No apparent anesthesia complications

## 2015-04-01 NOTE — H&P (Signed)
  Date of Initial H&P: 03/16/2015  History reviewed, patient examined, no change in status, stable for surgery.

## 2015-04-01 NOTE — Anesthesia Postprocedure Evaluation (Signed)
  Anesthesia Post-op Note  Patient: Erin Middleton  Procedure(s) Performed: Procedure(s): COLONOSCOPY WITH PROPOFOL (N/A)  Anesthesia type:General  Patient location: PACU  Post pain: Pain level controlled  Post assessment: Post-op Vital signs reviewed, Patient's Cardiovascular Status Stable, Respiratory Function Stable, Patent Airway and No signs of Nausea or vomiting  Post vital signs: Reviewed and stable  Last Vitals:  Filed Vitals:   04/01/15 1145  BP: 129/75  Pulse: 65  Resp: 15    Level of consciousness: awake, alert  and patient cooperative  Complications: No apparent anesthesia complications

## 2015-04-01 NOTE — Anesthesia Preprocedure Evaluation (Signed)
Anesthesia Evaluation  Patient identified by MRN, date of birth, ID band Patient awake    Reviewed: Allergy & Precautions, H&P , NPO status , Patient's Chart, lab work & pertinent test results, reviewed documented beta blocker date and time   History of Anesthesia Complications Negative for: history of anesthetic complications  Airway Mallampati: II  TM Distance: >3 FB Neck ROM: full    Dental no notable dental hx. (+) Teeth Intact   Pulmonary neg pulmonary ROS,    Pulmonary exam normal breath sounds clear to auscultation       Cardiovascular Exercise Tolerance: Good hypertension, On Medications (-) angina(-) CAD, (-) Past MI, (-) Cardiac Stents and (-) CABG Normal cardiovascular exam(-) dysrhythmias (-) Valvular Problems/Murmurs Rhythm:regular Rate:Normal     Neuro/Psych negative neurological ROS  negative psych ROS   GI/Hepatic Neg liver ROS, GERD  Controlled and Medicated,  Endo/Other  negative endocrine ROS  Renal/GU negative Renal ROS  negative genitourinary   Musculoskeletal   Abdominal   Peds  Hematology negative hematology ROS (+)   Anesthesia Other Findings Past Medical History:   Arthritis                                                    Cancer                                                         Comment:Breast   GERD (gastroesophageal reflux disease)                       Hypertension                                                 Reproductive/Obstetrics negative OB ROS                             Anesthesia Physical Anesthesia Plan  ASA: II  Anesthesia Plan: General   Post-op Pain Management:    Induction:   Airway Management Planned:   Additional Equipment:   Intra-op Plan:   Post-operative Plan:   Informed Consent: I have reviewed the patients History and Physical, chart, labs and discussed the procedure including the risks, benefits and alternatives  for the proposed anesthesia with the patient or authorized representative who has indicated his/her understanding and acceptance.   Dental Advisory Given  Plan Discussed with: Anesthesiologist, CRNA and Surgeon  Anesthesia Plan Comments:         Anesthesia Quick Evaluation

## 2015-04-15 ENCOUNTER — Other Ambulatory Visit: Payer: Self-pay | Admitting: Family Medicine

## 2015-04-15 DIAGNOSIS — Z1231 Encounter for screening mammogram for malignant neoplasm of breast: Secondary | ICD-10-CM

## 2015-04-23 ENCOUNTER — Ambulatory Visit
Admission: RE | Admit: 2015-04-23 | Discharge: 2015-04-23 | Disposition: A | Discharge status: Home / Self Care | Payer: BC Managed Care – PPO | Source: Ambulatory Visit | Attending: Family Medicine | Admitting: Family Medicine

## 2015-04-23 DIAGNOSIS — Z1231 Encounter for screening mammogram for malignant neoplasm of breast: Secondary | ICD-10-CM | POA: Insufficient documentation

## 2015-04-23 HISTORY — DX: Malignant neoplasm of unspecified site of unspecified female breast: C50.919

## 2015-04-24 ENCOUNTER — Other Ambulatory Visit: Payer: Self-pay | Admitting: Family Medicine

## 2015-05-12 DIAGNOSIS — M170 Bilateral primary osteoarthritis of knee: Secondary | ICD-10-CM | POA: Diagnosis not present

## 2015-06-03 DIAGNOSIS — M7051 Other bursitis of knee, right knee: Secondary | ICD-10-CM | POA: Diagnosis not present

## 2015-07-14 DIAGNOSIS — M1712 Unilateral primary osteoarthritis, left knee: Secondary | ICD-10-CM | POA: Diagnosis not present

## 2015-08-17 DIAGNOSIS — M170 Bilateral primary osteoarthritis of knee: Secondary | ICD-10-CM | POA: Diagnosis not present

## 2015-08-30 ENCOUNTER — Ambulatory Visit (INDEPENDENT_AMBULATORY_CARE_PROVIDER_SITE_OTHER): Payer: BC Managed Care – PPO | Admitting: Family Medicine

## 2015-08-30 VITALS — BP 142/72 | HR 68 | Temp 97.8°F | Resp 16 | Ht 64.5 in | Wt 160.0 lb

## 2015-08-30 DIAGNOSIS — I10 Essential (primary) hypertension: Secondary | ICD-10-CM | POA: Diagnosis not present

## 2015-08-30 DIAGNOSIS — Z888 Allergy status to other drugs, medicaments and biological substances status: Secondary | ICD-10-CM

## 2015-08-30 DIAGNOSIS — Z01818 Encounter for other preprocedural examination: Secondary | ICD-10-CM | POA: Diagnosis not present

## 2015-08-30 DIAGNOSIS — T464X1A Poisoning by angiotensin-converting-enzyme inhibitors, accidental (unintentional), initial encounter: Secondary | ICD-10-CM

## 2015-08-30 DIAGNOSIS — Z789 Other specified health status: Secondary | ICD-10-CM

## 2015-08-30 MED ORDER — LOSARTAN POTASSIUM-HCTZ 50-12.5 MG PO TABS: 2.0000 | ORAL_TABLET | Freq: Every day | ORAL | Status: DC

## 2015-08-30 NOTE — Progress Notes (Signed)
Patient ID: Erin Middleton, female   DOB: August 18, 1945, 70 y.o.   MRN: [id number]   VALOIS PALMITER  MRN: [id number] DOB: 05/05/1946  Subjective:  HPI   1. Essential hypertension The patient is a 70 year old female who presents for follow up of her hypertension.  Her last visit was on 02/23/15.  Management changes made at that time include discontinuing her Lisinopril HCTZ due to intolerance and starting her on Losartan HCTZ 50/12.5 mg.  She reports no adverse effects and good compliance with the medication.  She has not had her blood pressure checked since the medication change.   Her pressure today is 142/72 and she reports that she is in increased pain due to her knee and states that she is scheduled for surgery in 3 weeks with Dr. Sabra Heck for TKR of the left knee.  While she is here today we discussed getting her EKG and doing the surgical clearance if possible.  Patient Active Problem List   Diagnosis Date Noted  . Hypertension 01/18/2015  . Breast CA ([place]) 11/04/2014  . Acid reflux 11/04/2014  . Blood pressure elevated 11/04/2014  . Plantar fasciitis 11/04/2014  . H/O ear disorder 05/19/2014  . Breast cancer, female ([place]) 05/19/2014  . Dupuytren's contracture of foot 05/19/2014  . Avitaminosis D 09/21/2008  . 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 History of colon polyps 07/19/2006    Past Medical History  Diagnosis Date  . Arthritis   . GERD (gastroesophageal reflux disease)   . Hypertension   . Cancer ([place])     Breast  . Breast cancer ([place]) 1985    left breast cancer - chemotherapy    Social History   Social History  . Marital Status: Married    Spouse Name: N/A  . Number of Children: N/A  . Years of Education: N/A   Occupational History  . Not on file.   Social History Main Topics  . Smoking status: Never Smoker   . Smokeless tobacco: Not on file  . Alcohol Use: No     Comment: very seldom  . Drug Use: No  . Sexual Activity: Not on file   Other Topics Concern  . Not  on file   Social History Narrative    Outpatient Prescriptions Prior to Visit  Medication Sig Dispense Refill  . calcium gluconate 650 MG tablet Take 650 mg by mouth daily.    . cholecalciferol (VITAMIN D) 1000 UNITS tablet Take by mouth.    . losartan-hydrochlorothiazide (HYZAAR) 50-12.5 MG per tablet Take 1 tablet by mouth daily. 90 tablet 3  . meloxicam (MOBIC) 15 MG tablet Take 15 mg by mouth daily.  3  . Multiple Vitamins-Minerals (PRESERVISION/LUTEIN) CAPS Take by mouth.    Marland Kitchen omeprazole (PRILOSEC) 20 MG capsule TAKE ONE CAPSULE BY MOUTH DAILY 30 capsule 4  . omeprazole (PRILOSEC) 40 MG capsule Take by mouth.    . vitamin E 400 UNIT capsule Take by mouth.     No facility-administered medications prior to visit.    No Known Allergies  Review of Systems  Constitutional: Positive for malaise/fatigue (Secondary to pain and difficulty walking.). Negative for fever and chills.  Eyes: Negative.   Respiratory: Negative for cough, hemoptysis, sputum production, shortness of breath and wheezing.   Cardiovascular: Negative for chest pain, palpitations, orthopnea and leg swelling.  Gastrointestinal: Negative.   Genitourinary: Positive for frequency. Negative for dysuria, urgency and hematuria.  Musculoskeletal: Positive for joint pain. Negative for myalgias, back pain, falls and neck  pain.  Neurological: Negative for dizziness, weakness and headaches.  Endo/Heme/Allergies: Negative for polydipsia.  Psychiatric/Behavioral: Negative.    Objective:  BP 142/72 mmHg  Pulse 68  Temp(Src) 97.8 F (36.6 C) (Oral)  Resp 16  Ht 5' 4.5" (1.638 m)  Wt 160 lb (72.576 kg)  BMI 27.05 kg/m2  Physical Exam  Constitutional: She is oriented to person, place, and time and well-developed, well-nourished, and in no distress.  HENT:  Head: Normocephalic and atraumatic.  Right Ear: External ear normal.  Left Ear: External ear normal.  Nose: Nose normal.  Eyes: Conjunctivae are normal.    Pulmonary/Chest: Effort normal and breath sounds normal.  Abdominal: Soft.  Neurological: She is alert and oriented to person, place, and time.  Skin: Skin is warm and dry.  Psychiatric: Mood, memory, affect and judgment normal.    Assessment and Plan :   1. Essential hypertension Would like to see her blood pressure a little better, we will increase her medication to 2 daily. Follow up in a few weeks.  - losartan-hydrochlorothiazide (HYZAAR) 50-12.5 MG tablet; Take 2 tablets by mouth daily.  Dispense: 180 tablet; Refill: 3  2. Pre-op exam Patient is medically cleared for surgery - EKG 12-Lead  3. ACE inhibitor intolerance  - losartan-hydrochlorothiazide (HYZAAR) 50-12.5 MG tablet; Take 2 tablets by mouth daily.  Dispense: 180 tablet; Refill: 3 4. History of breast cancer I have done the exam and reviewed the above chart and it is accurate to the best of my knowledge.  Miguel Aschoff MD [facility] 08/30/2015 9:05 AM

## 2015-09-08 ENCOUNTER — Encounter
Admission: RE | Admit: 2015-09-08 | Discharge: 2015-09-08 | Disposition: A | Discharge status: Home / Self Care | Payer: BC Managed Care – PPO | Source: Ambulatory Visit | Attending: Specialist | Admitting: Specialist

## 2015-09-08 ENCOUNTER — Other Ambulatory Visit: Payer: Self-pay | Admitting: Specialist

## 2015-09-08 DIAGNOSIS — Z01812 Encounter for preprocedural laboratory examination: Secondary | ICD-10-CM | POA: Insufficient documentation

## 2015-09-08 HISTORY — DX: Headache: R51

## 2015-09-08 HISTORY — DX: Headache, unspecified: R51.9

## 2015-09-08 LAB — COMPREHENSIVE METABOLIC PANEL
ALBUMIN: 4.2 g/dL (ref 3.5–5.0)
ALK PHOS: 69 U/L (ref 38–126)
ALT: 21 U/L (ref 14–54)
ANION GAP: 10 (ref 5–15)
AST: 24 U/L (ref 15–41)
BILIRUBIN TOTAL: 0.8 mg/dL (ref 0.3–1.2)
BUN: 28 mg/dL — AB (ref 6–20)
CALCIUM: 9.3 mg/dL (ref 8.9–10.3)
CO2: 28 mmol/L (ref 22–32)
CREATININE: 1.01 mg/dL — AB (ref 0.44–1.00)
Chloride: 102 mmol/L (ref 101–111)
GFR calc Af Amer: >60 mL/min (ref >60)
GFR calc non Af Amer: 55 mL/min — ABNORMAL LOW (ref >60)
GLUCOSE: 90 mg/dL (ref 65–99)
Potassium: 3.2 mmol/L — ABNORMAL LOW (ref 3.5–5.1)
Sodium: 140 mmol/L (ref 135–145)
TOTAL PROTEIN: 7.1 g/dL (ref 6.5–8.1)

## 2015-09-08 LAB — CBC WITH DIFFERENTIAL/PLATELET
BASOS PCT: 0 %
Basophils Absolute: 0 10*3/uL (ref 0–0.1)
Eosinophils Absolute: 0.5 10*3/uL (ref 0–0.7)
Eosinophils Relative: 6 %
HEMATOCRIT: 37.7 % (ref 35.0–47.0)
HEMOGLOBIN: 12.9 g/dL (ref 12.0–16.0)
LYMPHS ABS: 2.1 10*3/uL (ref 1.0–3.6)
Lymphocytes Relative: 25 %
MCH: 31.9 pg (ref 26.0–34.0)
MCHC: 34.2 g/dL (ref 32.0–36.0)
MCV: 93.2 fL (ref 80.0–100.0)
MONOS PCT: 6 %
Monocytes Absolute: 0.5 10*3/uL (ref 0.2–0.9)
NEUTROS ABS: 5.1 10*3/uL (ref 1.4–6.5)
NEUTROS PCT: 63 %
Platelets: 217 10*3/uL (ref 150–440)
RBC: 4.05 MIL/uL (ref 3.80–5.20)
RDW: 12.8 % (ref 11.5–14.5)
WBC: 8.2 10*3/uL (ref 3.6–11.0)

## 2015-09-08 LAB — URINALYSIS COMPLETE WITH MICROSCOPIC (ARMC ONLY)
BACTERIA UA: NONE SEEN
Bilirubin Urine: NEGATIVE
Glucose, UA: NEGATIVE mg/dL
Hgb urine dipstick: NEGATIVE
Nitrite: NEGATIVE
PH: 5 (ref 5.0–8.0)
PROTEIN: NEGATIVE mg/dL
Specific Gravity, Urine: 1.021 (ref 1.005–1.030)

## 2015-09-08 LAB — PROTIME-INR
INR: 1.1
Prothrombin Time: 14.4 seconds (ref 11.4–15.0)

## 2015-09-08 LAB — TYPE AND SCREEN
ABO/RH(D): A POS
ANTIBODY SCREEN: NEGATIVE

## 2015-09-08 LAB — SURGICAL PCR SCREEN
MRSA, PCR: NEGATIVE
STAPHYLOCOCCUS AUREUS: NEGATIVE

## 2015-09-08 LAB — APTT: aPTT: 28 seconds (ref 24–36)

## 2015-09-08 NOTE — Patient Instructions (Addendum)
  Your procedure is scheduled on: 09-22-15 [facility]) Report to [place]. To find out your arrival time please call [phone] between 1PM - 3PM on 09-21-15 (TUESDAY)  Remember: Instructions that are not followed completely may result in serious medical risk, up to and including death, or upon the discretion of your surgeon and anesthesiologist your surgery may need to be rescheduled.    _X___ 1. Do not eat food or drink liquids after midnight. No gum chewing or hard candies.     _X___ 2. No Alcohol for 24 hours before or after surgery.   ____ 3. Bring all medications with you on the day of surgery if instructed.    _X___ 4. Notify your doctor if there is any change in your medical condition     (cold, fever, infections).     Do not wear jewelry, make-up, hairpins, clips or nail polish.  Do not wear lotions, powders, or perfumes. You may wear deodorant.  Do not shave 48 hours prior to surgery. Men may shave face and neck.  Do not bring valuables to the hospital.    [facility] is not responsible for any belongings or valuables.               Contacts, dentures or bridgework may not be worn into surgery.  Leave your suitcase in the car. After surgery it may be brought to your room.  For patients admitted to the hospital, discharge time is determined by your treatment team.   Patients discharged the day of surgery will not be allowed to drive home.   Please read over the following fact sheets that you were given:     _X___ Take these medicines the morning of surgery with A SIP OF WATER:    1. PRILOSEC (OMEPRAZOLE)  2. TAKE AN EXTRA PRILOSEC ON Tuesday NIGHT 09-21-15 BEFORE BED  3.   4.  5.  6.  ____ Fleet Enema (as directed)   _X___ Use CHG Soap as directed  ____ Use inhalers on the day of surgery  ____ Stop metformin 2 days prior to surgery    ____ Take 1/2 of usual insulin dose the night before surgery and none on the morning of surgery.    ____ Stop Coumadin/Plavix/aspirin-N/A  _X___ Stop Anti-inflammatories-STOP MELOXICAM 7 DAYS PRIOR TO SURGERY-NO NSAIDS OR ASPIRIN PRODUCTS-TYLENOL OK TO TAKE   _X___ Stop supplements until after surgery-STOP VITAMIN E 7 AND PRESERVISION DAYS PRIOR TO SURGERY   ____ Bring C-Pap to the hospital.

## 2015-09-09 LAB — ABO/RH: ABO/RH(D): A POS

## 2015-09-09 LAB — HEMOGLOBIN A1C: HEMOGLOBIN A1C: 5.7 % (ref 4.0–6.0)

## 2015-09-14 NOTE — Pre-Procedure Instructions (Signed)
Called Erin Middleton at [facility] to make sure she received my fax from 09-09-15 on a low K+.  She said that she sees my coversheet addressing low K+ but that she doesn't actually see the labs with it.  She is going to check into it to make sure they received the labs.  I told her that all the labs are in Epic and that Dr Sabra Heck could go in and look there too.  She said she will make sure someone orders pt a supplement if it has not already been ordered

## 2015-09-15 ENCOUNTER — Ambulatory Visit (INDEPENDENT_AMBULATORY_CARE_PROVIDER_SITE_OTHER): Payer: BC Managed Care – PPO | Admitting: Family Medicine

## 2015-09-15 ENCOUNTER — Encounter: Payer: Self-pay | Admitting: Family Medicine

## 2015-09-15 ENCOUNTER — Ambulatory Visit: Payer: BC Managed Care – PPO | Admitting: Family Medicine

## 2015-09-15 VITALS — BP 138/70 | HR 68 | Temp 97.9°F | Resp 14 | Wt 162.0 lb

## 2015-09-15 DIAGNOSIS — M1990 Unspecified osteoarthritis, unspecified site: Secondary | ICD-10-CM | POA: Diagnosis not present

## 2015-09-15 DIAGNOSIS — E876 Hypokalemia: Secondary | ICD-10-CM | POA: Diagnosis not present

## 2015-09-15 DIAGNOSIS — I10 Essential (primary) hypertension: Secondary | ICD-10-CM | POA: Diagnosis not present

## 2015-09-15 MED ORDER — POTASSIUM CHLORIDE CRYS ER 10 MEQ PO TBCR: 10.0000 meq | EXTENDED_RELEASE_TABLET | Freq: Two times a day (BID) | ORAL | Status: DC

## 2015-09-15 NOTE — Progress Notes (Signed)
Patient ID: Erin Middleton, female   DOB: 1945-11-27, 70 y.o.   MRN: [id number]   Patient: Erin Middleton Female    DOB: 1945/11/19   70 y.o.   MRN: [id number] Visit Date: 09/15/2015  Today's Provider: Wilhemena Durie, MD   Chief Complaint  Patient presents with  . Hypertension  . Follow-up   Subjective:    HPI  Hypertension, follow-up:  BP Readings from Last 3 Encounters:  09/15/15 138/70  09/08/15 142/64  08/30/15 142/72    She was last seen for hypertension 2 weeks ago.  BP at that visit was 142/72 . Management changes since that visit include increased losartan-HCTZ to 2 tablets daily. She reports good compliance with treatment. She is not having side effects.  She is not exercising. She is not adherent to low salt diet.   Outside blood pressures are not being checked. She is experiencing none.  Patient denies none.   Cardiovascular risk factors include advanced age (older than 7 for men, 31 for women) and hypertension.  Use of agents associated with hypertension: none.     Weight trend: stable Wt Readings from Last 3 Encounters:  09/15/15 162 lb (73.483 kg)  09/08/15 160 lb (72.576 kg)  08/30/15 160 lb (72.576 kg)    ------------------------------------------------------------------------     Previous Medications   CALCIUM GLUCONATE 650 MG TABLET    Take 650 mg by mouth daily. Reported on 09/15/2015   CHOLECALCIFEROL (VITAMIN D) 1000 UNITS TABLET    Take 1,000 Units by mouth daily. Reported on 09/15/2015   LOSARTAN-HYDROCHLOROTHIAZIDE (HYZAAR) 50-12.5 MG TABLET    Take 2 tablets by mouth daily.   MELOXICAM (MOBIC) 15 MG TABLET    Take 15 mg by mouth daily. Reported on 09/15/2015   MULTIPLE VITAMINS-MINERALS (PRESERVISION/LUTEIN) CAPS    Take by mouth. Reported on 09/15/2015   OMEPRAZOLE (PRILOSEC) 20 MG CAPSULE    TAKE ONE CAPSULE BY MOUTH DAILY   VITAMIN E 400 UNIT CAPSULE    Take 400 Units by mouth daily. Reported on 09/15/2015   No Known  Allergies  Review of Systems  Constitutional: Negative.   HENT: Negative.   Eyes: Negative.   Respiratory: Negative.   Cardiovascular: Negative.   Gastrointestinal: Negative.   Endocrine: Negative.   Genitourinary: Negative.   Musculoskeletal: Negative.   Skin: Negative.   Allergic/Immunologic: Negative.   Neurological: Negative.   Hematological: Negative.   Psychiatric/Behavioral: Negative.     Social History  Substance Use Topics  . Smoking status: Never Smoker   . Smokeless tobacco: Not on file  . Alcohol Use: 0.0 oz/week    0 Standard drinks or equivalent per week     Comment: very seldom   Objective:   BP 138/70 mmHg  Pulse 68  Temp(Src) 97.9 F (36.6 C) (Oral)  Resp 14  Wt 162 lb (73.483 kg)  Physical Exam  Constitutional: She is oriented to person, place, and time. She appears well-developed and well-nourished.  HENT:  Head: Normocephalic and atraumatic.  Right Ear: External ear normal.  Left Ear: External ear normal.  Eyes: Conjunctivae and EOM are normal. Pupils are equal, round, and reactive to light.  Neck: Normal range of motion. Neck supple.  Cardiovascular: Normal rate, regular rhythm, normal heart sounds and intact distal pulses.   Pulmonary/Chest: Effort normal and breath sounds normal.  Musculoskeletal: Normal range of motion.  Neurological: She is alert and oriented to person, place, and time. She has normal reflexes.  Psychiatric: She has a normal  mood and affect. Her behavior is normal. Judgment and thought content normal.        Assessment & Plan:     1. Arthritis Medically cleared for surgery  2. Hypokalemia Take Klor- Con for 1 month - potassium chloride (K-DUR,KLOR-CON) 10 MEQ tablet; Take 1 tablet (10 mEq total) by mouth 2 (two) times daily.  Dispense: 60 tablet; Refill: 0  Take Magnesium- Oxide two times daily  Recheck labs in 2 months  3. Essential hypertension Continue Losartan-HCTZ 50-12.5 mg   Follow up: No Follow-up on  file.

## 2015-09-21 NOTE — Pre-Procedure Instructions (Signed)
MEDICAL CLEARANCE RECEIVED FROM DR Rosanna [name]

## 2015-09-21 NOTE — Pre-Procedure Instructions (Signed)
Author Note Status Last Update User Last Update Date/Time    Jules Schick, CMA Sign at close encounter Jules Schick, [facility] 09/15/2015 11:21 AM    Progress Notes    Expand All Collapse All   Patient ID: Erin Middleton, female DOB: [birth year], 70 y.o. MRN: [id number]   Patient: Erin Middleton Female DOB: 07-25-1945 70 y.o. MRN: [id number] Visit Date: 09/15/2015  Today's Provider: Wilhemena Durie, MD   Chief Complaint  Patient presents with  . Hypertension  . Follow-up   Subjective:    HPI  Hypertension, follow-up:  BP Readings from Last 3 Encounters:  09/15/15 138/70  09/08/15 142/64  08/30/15 142/72    She was last seen for hypertension 2 weeks ago.  BP at that visit was 142/72. Management changes since that visit include increased losartan-HCTZ to 2 tablets daily. She reports good compliance with treatment. She is not having side effects.  She is not exercising. She is not adherent to low salt diet.  Outside blood pressures are not being checked. She is experiencing none.  Patient denies none.  Cardiovascular risk factors include advanced age (older than 48 for men, 21 for women) and hypertension.  Use of agents associated with hypertension: none.    Weight trend: stable Wt Readings from Last 3 Encounters:  09/15/15 162 lb (73.483 kg)  09/08/15 160 lb (72.576 kg)  08/30/15 160 lb (72.576 kg)    ------------------------------------------------------------------------     Previous Medications   CALCIUM GLUCONATE 650 MG TABLET  Take 650 mg by mouth daily. Reported on 09/15/2015   CHOLECALCIFEROL (VITAMIN D) 1000 UNITS TABLET  Take 1,000 Units by mouth daily. Reported on 09/15/2015   LOSARTAN-HYDROCHLOROTHIAZIDE (HYZAAR) 50-12.5 MG TABLET  Take 2 tablets by mouth daily.   MELOXICAM (MOBIC) 15 MG TABLET  Take 15 mg by mouth daily. Reported on 09/15/2015   MULTIPLE  VITAMINS-MINERALS (PRESERVISION/LUTEIN) CAPS  Take by mouth. Reported on 09/15/2015   OMEPRAZOLE (PRILOSEC) 20 MG CAPSULE  TAKE ONE CAPSULE BY MOUTH DAILY   VITAMIN E 400 UNIT CAPSULE  Take 400 Units by mouth daily. Reported on 09/15/2015   No Known Allergies  Review of Systems  Constitutional: Negative.  HENT: Negative.  Eyes: Negative.  Respiratory: Negative.  Cardiovascular: Negative.  Gastrointestinal: Negative.  Endocrine: Negative.  Genitourinary: Negative.  Musculoskeletal: Negative.  Skin: Negative.  Allergic/Immunologic: Negative.  Neurological: Negative.  Hematological: Negative.  Psychiatric/Behavioral: Negative.    Social History  Substance Use Topics  . Smoking status: Never Smoker   . Smokeless tobacco: Not on file  . Alcohol Use: 0.0 oz/week    0 Standard drinks or equivalent per week     Comment: very seldom   Objective:   BP 138/70 mmHg  Pulse 68  Temp(Src) 97.9 F (36.6 C) (Oral)  Resp 14  Wt 162 lb (73.483 kg)  Physical Exam  Constitutional: She is oriented to person, place, and time. She appears well-developed and well-nourished.  HENT:  Head: Normocephalic and atraumatic.  Right Ear: External ear normal.  Left Ear: External ear normal.  Eyes: Conjunctivae and EOM are normal. Pupils are equal, round, and reactive to light.  Neck: Normal range of motion. Neck supple.  Cardiovascular: Normal rate, regular rhythm, normal heart sounds and intact distal pulses.  Pulmonary/Chest: Effort normal and breath sounds normal.  Musculoskeletal: Normal range of motion.  Neurological: She is alert and oriented to person, place, and time. She has normal reflexes.  Psychiatric: She has a normal mood and affect.  Her behavior is normal. Judgment and thought content normal.        Assessment & Plan:     1. Arthritis Medically cleared for surgery  2. Hypokalemia Take Klor- Con for 1 month - potassium  chloride (K-DUR,KLOR-CON) 10 MEQ tablet; Take 1 tablet (10 mEq total) by mouth 2 (two) times daily. Dispense: 60 tablet; Refill: 0  Take Magnesium- Oxide two times daily  Recheck labs in 2 months  3. Essential hypertension Continue Losartan-HCTZ 50-12.5 mg   Follow up: No Follow-up on file.

## 2015-09-21 NOTE — H&P (Signed)
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 Symptoms are rated as moderate to severe, and have been worsenting.  This is significantly impairing activities of daily living.  She has elected for surgical management. She has failed conservative management with NSAIDS, injections, bracing, exercises, and rest.  Xrays show advanced arthritis of the left knee with spurring and sclerosis and joint narrowing. The risks and benefits of surgery have been discussed at length and the post op protocol as well.     Past Medical History  Diagnosis Date  . Arthritis   . GERD (gastroesophageal reflux disease)   . Hypertension   . Cancer ([place])     Breast  . Breast cancer ([place]) 1985    left breast cancer - chemotherapy  . Headache     migraines h/o   Past Surgical History  Procedure Laterality Date  . Mastectomy Left   . Dilation and curettage of uterus    . Neck surgery      neck deformity-straighten out neck as a child  . Palate / uvula biopsy / excision    . Colonoscopy with propofol N/A 04/01/2015    Procedure: COLONOSCOPY WITH PROPOFOL;  Surgeon: Hulen Luster, MD;  Location[facility] ENDOSCOPY;  Service: Gastroenterology;  Laterality: N/A;   Social History   Social History  . Marital Status: Married    Spouse Name: N/A  . Number of Children: N/A  . Years of Education: N/A   Social History Main Topics  . Smoking status: Never Smoker   . Smokeless tobacco: Not on file  . Alcohol Use: 0.0 oz/week    0 Standard drinks or equivalent per week     Comment: very seldom  . Drug Use: No  . Sexual Activity: Not on file   Other Topics Concern  . Not on file   Social History Narrative   Family History  Problem Relation Age of Onset  . Diabetes Mother   . Stroke Mother   . Dementia Mother   . Lung  cancer Father   . Heart disease Father   . Stroke Brother   . Diabetes Brother   . Throat cancer Brother   . Diabetes Brother    No Known Allergies Prior to Admission medications   Medication Sig Start Date End Date Taking? Authorizing Provider  calcium gluconate 650 MG tablet Take 650 mg by mouth daily. Reported on 09/15/2015    Historical Provider, MD  cholecalciferol (VITAMIN D) 1000 UNITS tablet Take 1,000 Units by mouth daily. Reported on 09/15/2015    Historical Provider, MD  losartan-hydrochlorothiazide (HYZAAR) 50-12.5 MG tablet Take 2 tablets by mouth daily. 08/30/15   Richard Maceo Pro., MD  magnesium oxide (MAG-OX) 400 MG tablet Take 400 mg by mouth 2 (two) times daily.    Historical Provider, MD  meloxicam (MOBIC) 15 MG tablet Take 15 mg by mouth daily. Reported on 09/15/2015 11/26/14   Historical Provider, MD  Multiple Vitamins-Minerals (PRESERVISION/LUTEIN) CAPS Take by mouth. Reported on 09/15/2015    Historical Provider, MD  omeprazole (PRILOSEC) 20 MG capsule TAKE ONE CAPSULE BY MOUTH DAILY Patient taking differently: TAKE ONE CAPSULE BY MOUTH DAILY-bedtime 04/24/15   Jerrol Banana., MD  potassium chloride (K-DUR,KLOR-CON) 10 MEQ tablet Take 1 tablet (10 mEq total) by mouth 2 (two) times daily. 09/15/15  Richard Maceo Pro., MD  vitamin E 400 UNIT capsule Take 400 Units by mouth daily. Reported on 09/15/2015    Historical Provider, MD     Positive ROS: All other systems have been reviewed and were otherwise negative with the exception of those mentioned in the HPI and as above.  Physical Exam: General: Alert, no acute distress Cardiovascular: No pedal edema. Heart is regular and without murmur.  Respiratory: No cyanosis, no use of accessory musculature. Lungs are clear. GI: No organomegaly, abdomen is soft and non-tender Skin: No lesions in the area of chief complaint Neurologic: Sensation intact distally Psychiatric: Patient is competent for consent with normal  mood and affect Lymphatic: No axillary or cervical lymphadenopathy  MUSCULOSKELETAL: The left knee is tender at the joint line with mild swelling.  ROM is 5-100*. She is stable.  Skin and NV status are normal.  There is minimal angulation.   Assessment: M17.12 Primary osteoarthritis of left knee  Plan: Plan for Procedure(s): TOTAL KNEE ARTHROPLASTY LEFT  The risks benefits and alternatives were discussed with the patient including but not limited to the risks of nonoperative treatment, versus surgical intervention including infection, bleeding, nerve injury,  blood clots, cardiopulmonary complications, morbidity, mortality, among others, and they were willing to proceed.   Park Breed, MD [phone]   09/21/2015 11:39 PM

## 2015-09-22 ENCOUNTER — Inpatient Hospital Stay: Payer: BC Managed Care – PPO

## 2015-09-22 ENCOUNTER — Inpatient Hospital Stay
Admission: RE | Admit: 2015-09-22 | Discharge: 2015-09-25 | DRG: 470 | Disposition: A | Discharge status: Home Health | Payer: BC Managed Care – PPO | Source: Ambulatory Visit | Attending: Specialist | Admitting: Specialist

## 2015-09-22 ENCOUNTER — Encounter: Payer: Self-pay | Admitting: *Deleted

## 2015-09-22 ENCOUNTER — Inpatient Hospital Stay: Payer: BC Managed Care – PPO | Admitting: Anesthesiology

## 2015-09-22 ENCOUNTER — Encounter
Admission: RE | Disposition: A | Discharge status: Home Health | Payer: Self-pay | Source: Ambulatory Visit | Attending: Specialist

## 2015-09-22 DIAGNOSIS — Z96659 Presence of unspecified artificial knee joint: Secondary | ICD-10-CM

## 2015-09-22 DIAGNOSIS — Z808 Family history of malignant neoplasm of other organs or systems: Secondary | ICD-10-CM | POA: Diagnosis not present

## 2015-09-22 DIAGNOSIS — Z801 Family history of malignant neoplasm of trachea, bronchus and lung: Secondary | ICD-10-CM | POA: Diagnosis not present

## 2015-09-22 DIAGNOSIS — Z79899 Other long term (current) drug therapy: Secondary | ICD-10-CM | POA: Diagnosis not present

## 2015-09-22 DIAGNOSIS — Z8249 Family history of ischemic heart disease and other diseases of the circulatory system: Secondary | ICD-10-CM | POA: Diagnosis not present

## 2015-09-22 DIAGNOSIS — Z853 Personal history of malignant neoplasm of breast: Secondary | ICD-10-CM

## 2015-09-22 DIAGNOSIS — M1712 Unilateral primary osteoarthritis, left knee: Secondary | ICD-10-CM | POA: Diagnosis not present

## 2015-09-22 DIAGNOSIS — I10 Essential (primary) hypertension: Secondary | ICD-10-CM | POA: Diagnosis present

## 2015-09-22 DIAGNOSIS — K219 Gastro-esophageal reflux disease without esophagitis: Secondary | ICD-10-CM | POA: Diagnosis present

## 2015-09-22 DIAGNOSIS — G43909 Migraine, unspecified, not intractable, without status migrainosus: Secondary | ICD-10-CM | POA: Diagnosis present

## 2015-09-22 DIAGNOSIS — M170 Bilateral primary osteoarthritis of knee: Principal | ICD-10-CM | POA: Diagnosis present

## 2015-09-22 DIAGNOSIS — Z823 Family history of stroke: Secondary | ICD-10-CM | POA: Diagnosis not present

## 2015-09-22 DIAGNOSIS — Z833 Family history of diabetes mellitus: Secondary | ICD-10-CM

## 2015-09-22 HISTORY — PX: TOTAL KNEE ARTHROPLASTY: SHX125

## 2015-09-22 LAB — CBC
HEMATOCRIT: 37.2 % (ref 35.0–47.0)
HEMOGLOBIN: 12.6 g/dL (ref 12.0–16.0)
MCH: 31.7 pg (ref 26.0–34.0)
MCHC: 33.8 g/dL (ref 32.0–36.0)
MCV: 94 fL (ref 80.0–100.0)
Platelets: 183 10*3/uL (ref 150–440)
RBC: 3.96 MIL/uL (ref 3.80–5.20)
RDW: 12.7 % (ref 11.5–14.5)
WBC: 10.2 10*3/uL (ref 3.6–11.0)

## 2015-09-22 LAB — POCT I-STAT 4, (NA,K, GLUC, HGB,HCT)
Glucose, Bld: 115 mg/dL — ABNORMAL HIGH (ref 65–99)
HCT: 41 % (ref 36.0–46.0)
HEMOGLOBIN: 13.9 g/dL (ref 12.0–15.0)
POTASSIUM: 4 mmol/L (ref 3.5–5.1)
Sodium: 141 mmol/L (ref 135–145)

## 2015-09-22 LAB — CREATININE, SERUM
Creatinine, Ser: 0.78 mg/dL (ref 0.44–1.00)
GFR calc Af Amer: >60 mL/min (ref >60)
GFR calc non Af Amer: >60 mL/min (ref >60)

## 2015-09-22 SURGERY — ARTHROPLASTY, KNEE, TOTAL
Anesthesia: Spinal | Site: Knee | Laterality: Left | Wound class: Clean

## 2015-09-22 MED ORDER — MORPHINE SULFATE (PF) 4 MG/ML IV SOLN
INTRAVENOUS | Status: AC
  Filled 2015-09-22: qty 1

## 2015-09-22 MED ORDER — VITAMIN E 180 MG (400 UNIT) PO CAPS
400.0000 [IU] | ORAL_CAPSULE | Freq: Every day | ORAL | Status: DC
  Administered 2015-09-23 – 2015-09-25 (×3): 400 [IU] via ORAL
  Filled 2015-09-22 (×3): qty 1

## 2015-09-22 MED ORDER — MAGNESIUM HYDROXIDE 400 MG/5ML PO SUSP
30.0000 mL | Freq: Every day | ORAL | Status: DC | PRN
  Filled 2015-09-22: qty 30

## 2015-09-22 MED ORDER — PREGABALIN 75 MG PO CAPS
ORAL_CAPSULE | ORAL | Status: AC
  Administered 2015-09-22: 75 mg via ORAL
  Filled 2015-09-22: qty 1

## 2015-09-22 MED ORDER — FLEET ENEMA 7-19 GM/118ML RE ENEM: 1.0000 | ENEMA | Freq: Once | RECTAL | Status: DC | PRN

## 2015-09-22 MED ORDER — METOCLOPRAMIDE HCL 5 MG/ML IJ SOLN: 5.0000 mg | Freq: Three times a day (TID) | INTRAMUSCULAR | Status: DC | PRN

## 2015-09-22 MED ORDER — SODIUM CHLORIDE 0.45 % IV SOLN
INTRAVENOUS | Status: DC
  Administered 2015-09-22 (×2): via INTRAVENOUS

## 2015-09-22 MED ORDER — LOSARTAN POTASSIUM-HCTZ 50-12.5 MG PO TABS: 2.0000 | ORAL_TABLET | Freq: Every day | ORAL | Status: DC

## 2015-09-22 MED ORDER — ACETAMINOPHEN 500 MG PO TABS
1000.0000 mg | ORAL_TABLET | Freq: Four times a day (QID) | ORAL | Status: AC
  Administered 2015-09-22 – 2015-09-23 (×4): 1000 mg via ORAL
  Filled 2015-09-22 (×4): qty 2

## 2015-09-22 MED ORDER — LACTATED RINGERS IV SOLN
INTRAVENOUS | Status: DC
  Administered 2015-09-22: 07:00:00 via INTRAVENOUS

## 2015-09-22 MED ORDER — SODIUM CHLORIDE FLUSH 0.9 % IV SOLN
INTRAVENOUS | Status: AC
  Filled 2015-09-22: qty 3

## 2015-09-22 MED ORDER — PHENOL 1.4 % MT LIQD: 1.0000 | OROMUCOSAL | Status: DC | PRN

## 2015-09-22 MED ORDER — PROPOFOL 10 MG/ML IV BOLUS
INTRAVENOUS | Status: DC | PRN
Start: 2015-09-22 — End: 2015-09-22
  Administered 2015-09-22: 50 mg via INTRAVENOUS

## 2015-09-22 MED ORDER — SODIUM CHLORIDE 0.9% FLUSH
INTRAVENOUS | Status: DC | PRN
  Administered 2015-09-22: 20 mL

## 2015-09-22 MED ORDER — LACTATED RINGERS IV SOLN
INTRAVENOUS | Status: DC | PRN
  Administered 2015-09-22 (×2): via INTRAVENOUS

## 2015-09-22 MED ORDER — ACETAMINOPHEN 325 MG PO TABS
650.0000 mg | ORAL_TABLET | Freq: Four times a day (QID) | ORAL | Status: DC | PRN
  Administered 2015-09-23 – 2015-09-25 (×3): 650 mg via ORAL
  Filled 2015-09-22 (×3): qty 2

## 2015-09-22 MED ORDER — VANCOMYCIN HCL 10 G IV SOLR
1500.0000 mg | INTRAVENOUS | Status: DC
  Filled 2015-09-22: qty 1500

## 2015-09-22 MED ORDER — METHOCARBAMOL 1000 MG/10ML IJ SOLN
500.0000 mg | Freq: Four times a day (QID) | INTRAVENOUS | Status: DC | PRN
  Filled 2015-09-22: qty 5

## 2015-09-22 MED ORDER — MENTHOL 3 MG MT LOZG: 1.0000 | LOZENGE | OROMUCOSAL | Status: DC | PRN

## 2015-09-22 MED ORDER — OCUVITE-LUTEIN PO CAPS
1.0000 | ORAL_CAPSULE | Freq: Every morning | ORAL | Status: DC
  Administered 2015-09-23 – 2015-09-25 (×3): 1 via ORAL
  Filled 2015-09-22 (×3): qty 1

## 2015-09-22 MED ORDER — PREGABALIN 75 MG PO CAPS
75.0000 mg | ORAL_CAPSULE | Freq: Two times a day (BID) | ORAL | Status: DC
  Administered 2015-09-22: 75 mg via ORAL

## 2015-09-22 MED ORDER — ONDANSETRON HCL 4 MG/2ML IJ SOLN
4.0000 mg | Freq: Four times a day (QID) | INTRAMUSCULAR | Status: DC | PRN
  Filled 2015-09-22: qty 2

## 2015-09-22 MED ORDER — CELECOXIB 200 MG PO CAPS
200.0000 mg | ORAL_CAPSULE | Freq: Two times a day (BID) | ORAL | Status: DC
  Administered 2015-09-22 – 2015-09-25 (×5): 200 mg via ORAL
  Filled 2015-09-22 (×5): qty 1

## 2015-09-22 MED ORDER — MELOXICAM 7.5 MG PO TABS
15.0000 mg | ORAL_TABLET | Freq: Every day | ORAL | Status: DC
  Administered 2015-09-22 – 2015-09-25 (×4): 15 mg via ORAL
  Filled 2015-09-22 (×4): qty 2

## 2015-09-22 MED ORDER — SODIUM CHLORIDE 0.9 % IJ SOLN
INTRAMUSCULAR | Status: AC
  Filled 2015-09-22: qty 20

## 2015-09-22 MED ORDER — POTASSIUM CHLORIDE CRYS ER 10 MEQ PO TBCR
10.0000 meq | EXTENDED_RELEASE_TABLET | Freq: Two times a day (BID) | ORAL | Status: DC
  Administered 2015-09-22 – 2015-09-25 (×6): 10 meq via ORAL
  Filled 2015-09-22 (×6): qty 1

## 2015-09-22 MED ORDER — HYDROCHLOROTHIAZIDE 25 MG PO TABS
25.0000 mg | ORAL_TABLET | Freq: Every day | ORAL | Status: DC
  Administered 2015-09-22 – 2015-09-25 (×4): 25 mg via ORAL
  Filled 2015-09-22 (×5): qty 1

## 2015-09-22 MED ORDER — CHLORHEXIDINE GLUCONATE 4 % EX LIQD: 1.0000 "application " | Freq: Once | CUTANEOUS | Status: DC

## 2015-09-22 MED ORDER — SENNA 8.6 MG PO TABS
1.0000 | ORAL_TABLET | Freq: Two times a day (BID) | ORAL | Status: DC
  Administered 2015-09-22 – 2015-09-25 (×7): 8.6 mg via ORAL
  Filled 2015-09-22 (×7): qty 1

## 2015-09-22 MED ORDER — TRANEXAMIC ACID 1000 MG/10ML IV SOLN
1000.0000 mg | INTRAVENOUS | Status: DC | PRN
  Administered 2015-09-22: 1000 mg via INTRAVENOUS

## 2015-09-22 MED ORDER — ONDANSETRON HCL 4 MG PO TABS: 4.0000 mg | ORAL_TABLET | Freq: Four times a day (QID) | ORAL | Status: DC | PRN

## 2015-09-22 MED ORDER — MAGNESIUM OXIDE 400 (241.3 MG) MG PO TABS
400.0000 mg | ORAL_TABLET | Freq: Two times a day (BID) | ORAL | Status: DC
  Administered 2015-09-22 – 2015-09-25 (×6): 400 mg via ORAL
  Filled 2015-09-22 (×6): qty 1

## 2015-09-22 MED ORDER — CALCIUM CARBONATE ANTACID 500 MG PO CHEW
500.0000 mg | CHEWABLE_TABLET | Freq: Every day | ORAL | Status: DC
  Administered 2015-09-23 – 2015-09-25 (×3): 500 mg via ORAL
  Filled 2015-09-22 (×3): qty 1

## 2015-09-22 MED ORDER — SODIUM CHLORIDE 0.9 % IV SOLN
INTRAVENOUS | Status: DC | PRN
  Administered 2015-09-22: 60 mL

## 2015-09-22 MED ORDER — ALUM & MAG HYDROXIDE-SIMETH 200-200-20 MG/5ML PO SUSP: 30.0000 mL | ORAL | Status: DC | PRN

## 2015-09-22 MED ORDER — BISACODYL 10 MG RE SUPP: 10.0000 mg | Freq: Every day | RECTAL | Status: DC | PRN

## 2015-09-22 MED ORDER — CELECOXIB 200 MG PO CAPS
200.0000 mg | ORAL_CAPSULE | Freq: Every day | ORAL | Status: DC
  Administered 2015-09-22: 200 mg via ORAL

## 2015-09-22 MED ORDER — ZOLPIDEM TARTRATE 5 MG PO TABS: 5.0000 mg | ORAL_TABLET | Freq: Every evening | ORAL | Status: DC | PRN

## 2015-09-22 MED ORDER — FENTANYL CITRATE (PF) 100 MCG/2ML IJ SOLN
25.0000 ug | INTRAMUSCULAR | Status: DC | PRN
  Administered 2015-09-22 (×2): 25 ug via INTRAVENOUS

## 2015-09-22 MED ORDER — VANCOMYCIN HCL 10 G IV SOLR
1500.0000 mg | Freq: Two times a day (BID) | INTRAVENOUS | Status: DC
  Filled 2015-09-22: qty 1500

## 2015-09-22 MED ORDER — FENTANYL CITRATE (PF) 100 MCG/2ML IJ SOLN
INTRAMUSCULAR | Status: AC
  Administered 2015-09-22: 25 ug via INTRAVENOUS
  Filled 2015-09-22: qty 2

## 2015-09-22 MED ORDER — CELECOXIB 200 MG PO CAPS
200.0000 mg | ORAL_CAPSULE | Freq: Every day | ORAL | Status: DC
  Administered 2015-09-23: 200 mg via ORAL
  Filled 2015-09-22: qty 1

## 2015-09-22 MED ORDER — ENOXAPARIN SODIUM 40 MG/0.4ML ~~LOC~~ SOLN
40.0000 mg | SUBCUTANEOUS | Status: DC
  Administered 2015-09-23 – 2015-09-25 (×3): 40 mg via SUBCUTANEOUS
  Filled 2015-09-22 (×3): qty 0.4

## 2015-09-22 MED ORDER — OXYCODONE HCL 5 MG PO TABS
5.0000 mg | ORAL_TABLET | ORAL | Status: DC | PRN
  Administered 2015-09-22: 5 mg via ORAL
  Administered 2015-09-22 – 2015-09-24 (×4): 10 mg via ORAL
  Administered 2015-09-24: 5 mg via ORAL
  Filled 2015-09-22: qty 2
  Filled 2015-09-22: qty 1
  Filled 2015-09-22 (×2): qty 2
  Filled 2015-09-22: qty 1
  Filled 2015-09-22: qty 2

## 2015-09-22 MED ORDER — SODIUM CHLORIDE 0.9 % IJ SOLN
INTRAMUSCULAR | Status: AC
  Filled 2015-09-22: qty 50

## 2015-09-22 MED ORDER — BUPIVACAINE HCL (PF) 0.5 % IJ SOLN
INTRAMUSCULAR | Status: DC | PRN
  Administered 2015-09-22: 3 mL

## 2015-09-22 MED ORDER — METOCLOPRAMIDE HCL 10 MG PO TABS: 5.0000 mg | ORAL_TABLET | Freq: Three times a day (TID) | ORAL | Status: DC | PRN

## 2015-09-22 MED ORDER — VANCOMYCIN HCL 1000 MG IV SOLR
1000.0000 mg | INTRAVENOUS | Status: DC | PRN
  Administered 2015-09-22: 1500 mg via INTRAVENOUS

## 2015-09-22 MED ORDER — PROPOFOL 500 MG/50ML IV EMUL
INTRAVENOUS | Status: DC | PRN
  Administered 2015-09-22: 50 ug/kg/min via INTRAVENOUS

## 2015-09-22 MED ORDER — NEOMYCIN-POLYMYXIN B GU 40-200000 IR SOLN
Status: DC | PRN
  Administered 2015-09-22: 16 mL

## 2015-09-22 MED ORDER — BUPIVACAINE LIPOSOME 1.3 % IJ SUSP
INTRAMUSCULAR | Status: AC
  Filled 2015-09-22: qty 20

## 2015-09-22 MED ORDER — LOSARTAN POTASSIUM 50 MG PO TABS
100.0000 mg | ORAL_TABLET | Freq: Every day | ORAL | Status: DC
  Administered 2015-09-22 – 2015-09-25 (×4): 100 mg via ORAL
  Filled 2015-09-22 (×5): qty 2

## 2015-09-22 MED ORDER — ACETAMINOPHEN 650 MG RE SUPP: 650.0000 mg | Freq: Four times a day (QID) | RECTAL | Status: DC | PRN

## 2015-09-22 MED ORDER — TRANEXAMIC ACID 1000 MG/10ML IV SOLN
INTRAVENOUS | Status: AC
  Filled 2015-09-22: qty 10

## 2015-09-22 MED ORDER — MIDAZOLAM HCL 2 MG/2ML IJ SOLN
INTRAMUSCULAR | Status: DC | PRN
  Administered 2015-09-22: 2 mg via INTRAVENOUS

## 2015-09-22 MED ORDER — FERROUS SULFATE 325 (65 FE) MG PO TABS
325.0000 mg | ORAL_TABLET | Freq: Three times a day (TID) | ORAL | Status: DC
  Administered 2015-09-22 – 2015-09-25 (×9): 325 mg via ORAL
  Filled 2015-09-22 (×10): qty 1

## 2015-09-22 MED ORDER — PREGABALIN 75 MG PO CAPS
75.0000 mg | ORAL_CAPSULE | Freq: Two times a day (BID) | ORAL | Status: DC
  Administered 2015-09-22 – 2015-09-25 (×6): 75 mg via ORAL
  Filled 2015-09-22 (×6): qty 1

## 2015-09-22 MED ORDER — PANTOPRAZOLE SODIUM 40 MG PO TBEC
40.0000 mg | DELAYED_RELEASE_TABLET | Freq: Every day | ORAL | Status: DC
  Administered 2015-09-22 – 2015-09-25 (×4): 40 mg via ORAL
  Filled 2015-09-22 (×4): qty 1

## 2015-09-22 MED ORDER — TRANEXAMIC ACID 1000 MG/10ML IV SOLN
1000.0000 mg | Freq: Once | INTRAVENOUS | Status: DC
  Filled 2015-09-22: qty 10

## 2015-09-22 MED ORDER — MORPHINE SULFATE (PF) 4 MG/ML IV SOLN
INTRAVENOUS | Status: DC | PRN
  Administered 2015-09-22: 4 mg via INTRAVENOUS

## 2015-09-22 MED ORDER — DIPHENHYDRAMINE HCL 12.5 MG/5ML PO ELIX: 12.5000 mg | ORAL_SOLUTION | ORAL | Status: DC | PRN

## 2015-09-22 MED ORDER — NEOMYCIN-POLYMYXIN B GU 40-200000 IR SOLN
Status: AC
  Filled 2015-09-22: qty 20

## 2015-09-22 MED ORDER — KETOROLAC TROMETHAMINE 30 MG/ML IJ SOLN
INTRAMUSCULAR | Status: DC | PRN
  Administered 2015-09-22: 30 mg via INTRAVENOUS

## 2015-09-22 MED ORDER — CELECOXIB 200 MG PO CAPS
ORAL_CAPSULE | ORAL | Status: AC
  Administered 2015-09-22: 200 mg via ORAL
  Filled 2015-09-22: qty 1

## 2015-09-22 MED ORDER — MORPHINE SULFATE (PF) 2 MG/ML IV SOLN
2.0000 mg | INTRAVENOUS | Status: DC | PRN
  Administered 2015-09-22 (×2): 2 mg via INTRAVENOUS
  Filled 2015-09-22 (×2): qty 1

## 2015-09-22 MED ORDER — ONDANSETRON HCL 4 MG/2ML IJ SOLN: 4.0000 mg | Freq: Once | INTRAMUSCULAR | Status: DC | PRN

## 2015-09-22 MED ORDER — METHOCARBAMOL 500 MG PO TABS: 500.0000 mg | ORAL_TABLET | Freq: Four times a day (QID) | ORAL | Status: DC | PRN

## 2015-09-22 MED ORDER — VITAMIN D 1000 UNITS PO TABS
1000.0000 [IU] | ORAL_TABLET | Freq: Every day | ORAL | Status: DC
  Administered 2015-09-23 – 2015-09-25 (×3): 1000 [IU] via ORAL
  Filled 2015-09-22 (×3): qty 1

## 2015-09-22 MED ORDER — VANCOMYCIN HCL 10 G IV SOLR
1500.0000 mg | Freq: Two times a day (BID) | INTRAVENOUS | Status: AC
  Administered 2015-09-22 – 2015-09-23 (×2): 1500 mg via INTRAVENOUS
  Filled 2015-09-22 (×2): qty 1500

## 2015-09-22 SURGICAL SUPPLY — 53 items
AUTOTRANSFUS HAS 1/8 (MISCELLANEOUS) ×2
BLADE DEBAKEY 8.0 (BLADE) ×2 IMPLANT
BLADE SAGITTAL WIDE XTHICK NO (BLADE) ×2 IMPLANT
CANISTER SUCT 1200ML W/VALVE (MISCELLANEOUS) ×2 IMPLANT
CANISTER SUCT 3000ML (MISCELLANEOUS) ×2 IMPLANT
CAP KNEE TOTAL 3 SIGMA ×2 IMPLANT
CATH TRAY METER 16FR LF (MISCELLANEOUS) ×2 IMPLANT
CEMENT HV SMART SET (Cement) ×4 IMPLANT
CEMENT TIBIA MBT SIZE 2.5 (Knees) IMPLANT
CHLORAPREP W/TINT 26ML (MISCELLANEOUS) ×4 IMPLANT
COOLER POLAR GLACIER W/PUMP (MISCELLANEOUS) ×2 IMPLANT
DRAPE INCISE IOBAN 66X60 STRL (DRAPES) ×2 IMPLANT
DRAPE SHEET LG 3/4 BI-LAMINATE (DRAPES) ×2 IMPLANT
DRSG AQUACEL AG ADV 3.5X10 (GAUZE/BANDAGES/DRESSINGS) ×2 IMPLANT
DRSG AQUACEL AG ADV 3.5X14 (GAUZE/BANDAGES/DRESSINGS) ×2 IMPLANT
ELECT REM PT RETURN 9FT ADLT (ELECTROSURGICAL) ×2
ELECTRODE REM PT RTRN 9FT ADLT (ELECTROSURGICAL) ×1 IMPLANT
GLOVE INDICATOR 8.0 STRL GRN (GLOVE) ×4 IMPLANT
GLOVE SURG ORTHO 8.0 STRL STRW (GLOVE) ×2 IMPLANT
GLOVE SURG ORTHO 8.5 STRL (GLOVE) ×4 IMPLANT
GOWN STRL REUS W/ TWL LRG LVL3 (GOWN DISPOSABLE) ×3 IMPLANT
GOWN STRL REUS W/TWL LRG LVL3 (GOWN DISPOSABLE) ×3
HANDPIECE SUCTION TUBG SURGILV (MISCELLANEOUS) ×2 IMPLANT
IMMBOLIZER KNEE 19 BLUE UNIV (SOFTGOODS) ×2 IMPLANT
KIT RM TURNOVER STRD PROC AR (KITS) ×2 IMPLANT
NEEDLE 18GX1X1/2 (RX/OR ONLY) (NEEDLE) ×2 IMPLANT
NEEDLE SPNL 18GX3.5 QUINCKE PK (NEEDLE) ×2 IMPLANT
NEEDLE SPNL 22GX3.5 WHITACR BK (NEEDLE) ×2 IMPLANT
NS IRRIG 1000ML POUR BTL (IV SOLUTION) ×2 IMPLANT
PACK TOTAL KNEE (MISCELLANEOUS) ×2 IMPLANT
PAD WRAPON POLAR KNEE (MISCELLANEOUS) ×1 IMPLANT
PIN STEINMAN FIXATION KNEE (PIN) IMPLANT
SOL .9 NS 3000ML IRR  AL (IV SOLUTION) ×1
SOL .9 NS 3000ML IRR UROMATIC (IV SOLUTION) ×1 IMPLANT
SPONGE LAP 18X18 5 PK (GAUZE/BANDAGES/DRESSINGS) ×2 IMPLANT
STAPLER SKIN PROX 35W (STAPLE) ×2 IMPLANT
SUCTION FRAZIER HANDLE 10FR (MISCELLANEOUS) ×1
SUCTION TUBE FRAZIER 10FR DISP (MISCELLANEOUS) ×1 IMPLANT
SUT BONE WAX W31G (SUTURE) ×2 IMPLANT
SUT DVC 2 QUILL PDO  T11 36X36 (SUTURE) ×1
SUT DVC 2 QUILL PDO T11 36X36 (SUTURE) ×1 IMPLANT
SUT ORTHOCORD OS-6 NDL 36 (SUTURE) ×2 IMPLANT
SUT QUILL PDO 0 36 36 VIOLET (SUTURE) ×2 IMPLANT
SUT VIC AB 0 CT1 36 (SUTURE) ×2 IMPLANT
SUT VIC AB 2-0 CT1 (SUTURE) ×2 IMPLANT
SYR 20CC LL (SYRINGE) ×4 IMPLANT
SYR 50ML LL SCALE MARK (SYRINGE) ×4 IMPLANT
SYSTEM AUTOTRANSFUS DUAL TROCR (MISCELLANEOUS) ×1 IMPLANT
TAPE MICROFOAM 4IN (TAPE) ×2 IMPLANT
TIBIA MBT CEMENT SIZE 2.5 (Knees) IMPLANT
TOWER CARTRIDGE SMART MIX (DISPOSABLE) ×4 IMPLANT
TUBE SUCT KAM VAC (TUBING) ×2 IMPLANT
WRAPON POLAR PAD KNEE (MISCELLANEOUS) ×2

## 2015-09-22 NOTE — Progress Notes (Signed)
Informed Dr. Jonny Ruiz that no antibiotics were ordered post operatively for his total joint patient. MD to place orders.

## 2015-09-22 NOTE — Transfer of Care (Signed)
Immediate Anesthesia Transfer of Care Note  Patient: Erin Middleton  Procedure(s) Performed: Procedure(s): TOTAL KNEE ARTHROPLASTY (Left)  Patient Location: PACU  Anesthesia Type:Spinal  Level of Consciousness: awake and alert   Airway & Oxygen Therapy: Patient Spontanous Breathing  Post-op Assessment: Report given to RN  Post vital signs: Reviewed and stable  Last Vitals:  Filed Vitals:   09/22/15 0607 09/22/15 1019  BP: 150/60 115/62  Pulse: 77 58  Temp: 36.5 C 36.8 C  Resp: 16 13    Complications: No apparent anesthesia complications

## 2015-09-22 NOTE — Evaluation (Signed)
Physical Therapy Evaluation Patient Details Name: Erin Middleton MRN: [id number] DOB: [birth year] Today's Date: 09/22/2015   History of Present Illness  Pt is a pleasant 70 y/o female that presents for L TKA on 09/22/2015.   Clinical Impression  Patient has been fairly independent prior to this admission, she continues to work several days a week. Today she is able to participate in there-ex, however quadricep weakness noted by difficulty completing SLR without PT assistance. Patient requires assistance with both bed mobility and transfers due to some confusion (seemed medication related) and weakness in her LLE. Once in standing she really struggled to bear weight through her LLE and was unable to take any real steps, more just pivoting on her feet with assistance from PT. For now would recommend SNF after discharge, though PT would expect her to progress well as she is able to tolerate her symptoms better as she is active and fairly independent at baseline.     Follow Up Recommendations SNF (Pending progress will likely progress to HHPT)    Equipment Recommendations  Rolling walker with 5" wheels    Recommendations for Other Services       Precautions / Restrictions Precautions Precautions: Knee Precaution Booklet Issued: No Required Braces or Orthoses: Knee Immobilizer - Left Knee Immobilizer - Left: On except when in CPM Restrictions Weight Bearing Restrictions: Yes LLE Weight Bearing: Partial weight bearing      Mobility  Bed Mobility Overal bed mobility: Needs Assistance Bed Mobility: Supine to Sit     Supine to sit: Min assist     General bed mobility comments: Patient requires extra time and cuing for LLE secondary to pain/weakness. She is able to manage her torso with UEs through bed railing.   Transfers Overall transfer level: Needs assistance Equipment used: Rolling walker (2 wheeled) Transfers: Sit to/from Stand Sit to Stand: Min guard;Min assist          General transfer comment: PAtient required cuing for hand placement and technique with sliding LLE and maintaining PWB. Mild LOB/unsteadiness posteriorly   Ambulation/Gait Ambulation/Gait assistance: Min guard;Min assist Ambulation Distance (Feet): 3 Feet Assistive device: Rolling walker (2 wheeled)     Gait velocity interpretation: <1.8 ft/sec, indicative of risk for recurrent falls General Gait Details: Patient is very reluctant to take any steps or lift her feet to perform pivoting to sit in chair.   Stairs            Wheelchair Mobility    Modified Rankin (Stroke Patients Only)       Balance Overall balance assessment: Needs assistance Sitting-balance support: Bilateral upper extremity supported Sitting balance-Leahy Scale: Good     Standing balance support: Bilateral upper extremity supported Standing balance-Leahy Scale: Poor                               Pertinent Vitals/Pain Pain Assessment: Faces Faces Pain Scale: Hurts even more Pain Location: L knee  Pain Descriptors / Indicators: Aching;Operative site guarding Pain Intervention(s): Monitored during session;Premedicated before session;Patient requesting pain meds-RN notified;Ice applied;Limited activity within patient's tolerance;Repositioned    Home Living Family/patient expects to be discharged to:: Private residence Living Arrangements: Spouse/significant other Available Help at Discharge: Family Type of Home: House Home Access: Stairs to enter Entrance Stairs-Rails: None Entrance Stairs-Number of Steps: 3 Home Layout: One level        Prior Function Level of Independence: Independent  Comments: Patient still works at [employer], has difficulty with steps but does not use AD.      Hand Dominance        Extremity/Trunk Assessment   Upper Extremity Assessment: Overall WFL for tasks assessed           Lower Extremity Assessment: LLE deficits/detail    LLE Deficits / Details: Unable to complete 10 SLRs, limited with heels slides.     Communication   Communication: No difficulties  Cognition Arousal/Alertness: Awake/alert Behavior During Therapy: WFL for tasks assessed/performed (Some disorientation due to medications, but able to follow commands) Overall Cognitive Status: Within Functional Limits for tasks assessed                      General Comments General comments (skin integrity, edema, etc.): Tubes and drains in appropriate condition.     Exercises Total Joint Exercises Ankle Circles/Pumps: AROM;Both;10 reps Heel Slides: AAROM;Both;10 reps Hip ABduction/ADduction: AROM;Right;10 reps Straight Leg Raises: AAROM;Left;10 reps;AROM;Right Long Arc Quad: AROM;Right;10 reps Goniometric ROM: 14-59      Assessment/Plan    PT Assessment Patient needs continued PT services  PT Diagnosis Difficulty walking   PT Problem List Decreased strength;Decreased mobility;Decreased activity tolerance;Decreased balance;Pain;Decreased knowledge of use of DME;Decreased knowledge of precautions;Decreased range of motion  PT Treatment Interventions Gait training;DME instruction;Therapeutic activities;Therapeutic exercise;Stair training;Balance training;Manual techniques   PT Goals (Current goals can be found in the Care Plan section) Acute Rehab PT Goals Patient Stated Goal: To return home  PT Goal Formulation: With patient/family Time For Goal Achievement: 10/06/15 Potential to Achieve Goals: Good    Frequency BID   Barriers to discharge        Co-evaluation               End of Session Equipment Utilized During Treatment: Gait belt;Left knee immobilizer Activity Tolerance: Patient tolerated treatment well;Patient limited by pain Patient left: in chair;with chair alarm set;with call bell/phone within reach;with family/visitor present Nurse Communication: Mobility status;Patient requests pain meds         Time:  1535-1605 PT Time Calculation (min) (ACUTE ONLY): 30 min   Charges:   PT Evaluation $PT Eval Moderate Complexity: 1 Procedure PT Treatments $Therapeutic Exercise: 8-22 mins   PT G Codes:       Kerman Passey, PT, DPT    09/22/2015, 6:43 PM

## 2015-09-22 NOTE — Op Note (Signed)
DATE OF SURGERY:  09/22/2015 TIME: 10:24 AM  PATIENT NAME:  Erin Middleton   AGE: 70 y.o.    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  PREOPERATIVE INDICATIONS:  Erin Middleton is a 70 y.o. year old female with end stage bone on bone degenerative arthritis of the knee who failed conservative treatment, including injections, antiinflammatories, activity modification, and assistive devices, and had significant impairment of their activities of daily living, and elected for Total Knee Arthroplasty.   The risks, benefits, and alternatives were discussed at length including but not limited to the risks of infection, bleeding, nerve injury, stiffness, blood clots, the need for revision surgery, cardiopulmonary complications, among others, and they were willing to proceed.  OPERATIVE FINDINGS AND UNIQUE ASPECTS OF THE CASE:  Severe damage to [place] and patella.  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  OPERATIVE DESCRIPTION:   The patient was brought to the operative room and placed in a supine position. Spinal anesthesia was administered. IV Vancomycin antibiotics were given. The lower extremity was prepped and draped in the usual sterile fashion. Time out was performed. The leg was elevated and exsanguinated and the tourniquet was inflated to 350 mmHg  An anterior midline incision was made.  Anterior quadriceps tendon splitting approach was performed. The patella was everted and osteophytes were removed. The anterior horn of the medial and lateral meniscus was removed.  Then the extramedullary tibial cutting jig was utilized making the appropriate cut using the anterior tibial crest as a  reference building in appropriate posterior slope. Care was taken during the cut to protect the medial and collateral ligaments. The proximal tibia was removed along with the posterior horns of the menisci. The PCL was sacrificed.  The distal femur was sized as a standard. Medial release was carried out. The anterior femoral cutting guide was aligned and centering hole made. The rotation guide was inserted and the anterior cutting guide pinned in place, and was in excellent alignment. The posterior femoral cuts were made. A 10 mm flexion gap was established. The distal femoral cutting guide was introduced at 4 of valgus. This was pinned and the distal femoral cut made. A 10 mm extension gap was established and was stable. The finishing guide was applied and finishing cuts made. The [place] retractor was inserted and the keeled #2 1/2 tibial trial was pinned in place. Centering hole was made and the keel inserted. The femoral component was inserted along with a 10 mm  insert and the knee articulated.  Extension and flexion showed good stability throughout. The patella was then sized and cut made for the patellar component. Centering holes were made. The trial was inserted and the knee articulated nicely with no need for lateral release. The trials were all removed and the knee thoroughly irrigated with pulsed lavage. Exparil was injected. The knee was dried and the cement mixed. The #2 1/2  keeled tibial component, standard femoral component and patellar components were all cemented in place and excess cement was removed. The cement was allowed to harden for 10 minutes. Further irrigated. Further irrigation was carried out. Bone wax was applied to all raw bony surfaces. Autovac drains were inserted. Toradol and  morphine were injected. The capsule was closed with #2 Quill suture, and the subjacent tissues were closed with 0 Quill suture. The skin was closed with staples.Sponge and needle counts were correct.   Aquacel dressing with TENS pads and a dry sterile dressing were applied. Polar Care and knee immobilizer were applied. Tourniquet was deflated with excellent return of blood flow to foot. Patient was transferred to a hospital bed and taken to the recovery room in good condition.  Park Breed, MD

## 2015-09-22 NOTE — Care Management Note (Signed)
Case Management Note  Patient Details  Name: Erin Middleton MRN: [id number] Date of Birth: [birth year]  Subjective/Objective:                  Met with patient and her two daughters to discuss discharge planning. Patient plans to go to Dr. Ammie Ferrier office after discharge for outpatient PT. She does not have any DME because she was ambulatory without assistance in the home. She lives with her husband and not concerned about returning home. PT pending. She uses [facility] for Rx.  Action/Plan: List of home health care agencies left at bedside. RNCM will continue to follow.   Expected Discharge Date:                  Expected Discharge Plan:     In-House Referral:     Discharge planning Services  CM Consult  Post Acute Care Choice:    Choice offered to:  Patient, Adult Children  DME Arranged:    DME Agency:     HH Arranged:    [place] Agency:     Status of Service:  In process, will continue to follow  Medicare Important Message Given:    Date Medicare IM Given:    Medicare IM give by:    Date Additional Medicare IM Given:    Additional Medicare Important Message give by:     If discussed at [place] of Stay Meetings, dates discussed:    Additional Comments:  Marshell Garfinkel, RN 09/22/2015, 1:23 PM

## 2015-09-22 NOTE — Anesthesia Preprocedure Evaluation (Signed)
Anesthesia Evaluation  Patient identified by MRN, date of birth, ID band Patient awake    Reviewed: Allergy & Precautions, NPO status , Patient's Chart, lab work & pertinent test results  History of Anesthesia Complications Negative for: history of anesthetic complications  Airway Mallampati: III       Dental   Pulmonary neg pulmonary ROS,           Cardiovascular hypertension, Pt. on medications      Neuro/Psych negative neurological ROS  negative psych ROS   GI/Hepatic Neg liver ROS, GERD  Medicated and Controlled,  Endo/Other  negative endocrine ROS  Renal/GU negative Renal ROS     Musculoskeletal   Abdominal   Peds  Hematology   Anesthesia Other Findings   Reproductive/Obstetrics                             Anesthesia Physical Anesthesia Plan  ASA: II  Anesthesia Plan: Spinal   Post-op Pain Management:    Induction: Intravenous  Airway Management Planned:   Additional Equipment:   Intra-op Plan:   Post-operative Plan:   Informed Consent: I have reviewed the patients History and Physical, chart, labs and discussed the procedure including the risks, benefits and alternatives for the proposed anesthesia with the patient or authorized representative who has indicated his/her understanding and acceptance.     Plan Discussed with:   Anesthesia Plan Comments:         Anesthesia Quick Evaluation

## 2015-09-22 NOTE — Anesthesia Procedure Notes (Addendum)
Spinal Patient location during procedure: OR Start time: 09/22/2015 7:33 AM End time: 09/22/2015 7:44 AM Preanesthetic Checklist Completed: patient identified, site marked, surgical consent, pre-op evaluation, timeout performed, IV checked, risks and benefits discussed and monitors and equipment checked Spinal Block Patient position: sitting Prep: Betadine Patient monitoring: heart rate, continuous pulse ox, blood pressure and cardiac monitor Approach: midline Location: L4-5 Injection technique: single-shot Needle Needle type: [place] and Introducer  Needle gauge: 24 G Needle length: 12.7 cm Needle insertion depth: 8 cm Additional Notes Negative paresthesia. Negative blood return. Positive free-flowing CSF. Expiration date of kit checked and confirmed. Patient tolerated procedure well, without complications.    Date/Time: 09/22/2015 7:40 AM Performed by: [id number], Jonny Longino Oxygen Delivery Method: Simple face mask

## 2015-09-22 NOTE — H&P (Signed)
THE PATIENT WAS SEEN PRIOR TO SURGERY TODAY.  HISTORY, ALLERGIES, HOME MEDICATIONS AND OPERATIVE PROCEDURE WERE REVIEWED. RISKS AND BENEFITS OF SURGERY DISCUSSED WITH PATIENT AGAIN.  NO CHANGES FROM INITIAL HISTORY AND PHYSICAL NOTED.    

## 2015-09-22 NOTE — NC FL2 (Signed)
[place] LEVEL OF CARE SCREENING TOOL     IDENTIFICATION  Patient Name: Erin Middleton Birthdate: Nov 05, 1945 Sex: female Admission Date (Current Location): 09/22/2015  [place] and [place] Number:  Engineering geologist and Address:  [facility], [address] [place], [place] [address]      Provider Number: [id number]  Attending Physician Name and Address:  Earnestine Leys, MD  Relative Name and Phone Number:       Current Level of Care: Hospital Recommended Level of Care: [place] Prior Approval Number:    Date Approved/Denied:   PASRR Number:  ([id number])  Discharge Plan: SNF    Current Diagnoses: Patient Active Problem List   Diagnosis Date Noted  . 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 Hypertension 01/18/2015  . Breast CA ([place]) 11/04/2014  . Acid reflux 11/04/2014  . Blood pressure elevated 11/04/2014  . Plantar fasciitis 11/04/2014  . H/O ear disorder 05/19/2014  . Breast cancer, female ([place]) 05/19/2014  . Dupuytren's contracture of foot 05/19/2014  . Avitaminosis D 09/21/2008  . 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 History of colon polyps 07/19/2006    Orientation RESPIRATION BLADDER Height & Weight     Self, Time, Situation, Place  Normal Continent Weight: 162 lb (73.483 kg) Height:  5\' 4"  (162.6 cm)  BEHAVIORAL SYMPTOMS/MOOD NEUROLOGICAL BOWEL NUTRITION STATUS   (none)  (none ) Continent Diet (Regular Diet )  AMBULATORY STATUS COMMUNICATION OF NEEDS Skin   Extensive Assist Verbally Surgical wounds (Incision: Left Knee )                       Personal Care Assistance Level of Assistance  Bathing, Feeding, Dressing Bathing Assistance: Limited assistance Feeding assistance: Independent Dressing Assistance: Limited assistance     Functional Limitations Info  Sight, Hearing, Speech Sight Info: Adequate Hearing Info: Adequate Speech Info: Adequate    SPECIAL CARE FACTORS FREQUENCY  PT  (By licensed PT), OT (By licensed OT)     PT Frequency:  (5) OT Frequency:  (5)            Contractures      Additional Factors Info  Code Status, Allergies Code Status Info:  (Not on file. ) Allergies Info:  (No Known Allergies. )           Current Medications (09/22/2015):  This is the current hospital active medication list Current Facility-Administered Medications  Medication Dose Route Frequency Provider Last Rate Last Dose  . 0.45 % sodium chloride infusion   Intravenous Continuous Earnestine Leys, MD 75 mL/hr at 09/22/15 1202    . acetaminophen (TYLENOL) tablet 650 mg  650 mg Oral Q6H PRN Earnestine Leys, MD       Or  . acetaminophen (TYLENOL) suppository 650 mg  650 mg Rectal Q6H PRN Earnestine Leys, MD      . acetaminophen (TYLENOL) tablet 1,000 mg  1,000 mg Oral 4 times per day Earnestine Leys, MD   1,000 mg at 09/22/15 1340  . alum & mag hydroxide-simeth (MAALOX/MYLANTA) 200-200-20 MG/5ML suspension 30 mL  30 mL Oral Q4H PRN Earnestine Leys, MD      . bisacodyl (DULCOLAX) suppository 10 mg  10 mg Rectal Daily PRN Earnestine Leys, MD      . calcium carbonate (TUMS - dosed in mg elemental calcium) chewable tablet 500 mg  500 mg Oral Daily Earnestine Leys, MD   500 mg at 09/22/15 1233  . celecoxib (CELEBREX) capsule 200 mg  200 mg Oral Q12H Earnestine Leys, MD   200 mg at 09/22/15 1343  . celecoxib (CELEBREX) capsule 200 mg  200 mg Oral Daily Earnestine Leys, MD   0 mg at 09/22/15 1233  . cholecalciferol (VITAMIN D) tablet 1,000 Units  1,000 Units Oral Daily Earnestine Leys, MD   1,000 Units at 09/22/15 1233  . diphenhydrAMINE (BENADRYL) 12.5 MG/5ML elixir 12.5-25 mg  12.5-25 mg Oral Q4H PRN Earnestine Leys, MD      . Derrill Memo ON 09/23/2015] enoxaparin (LOVENOX) injection 40 mg  40 mg Subcutaneous Q24H Earnestine Leys, MD      . ferrous sulfate tablet 325 mg  325 mg Oral TID PC Earnestine Leys, MD   325 mg at 09/22/15 1341  . losartan (COZAAR) tablet 100 mg  100 mg Oral Daily Earnestine Leys, MD   100  mg at 09/22/15 1340   And  . hydrochlorothiazide (HYDRODIURIL) tablet 25 mg  25 mg Oral Daily Earnestine Leys, MD   25 mg at 09/22/15 1341  . magnesium hydroxide (MILK OF MAGNESIA) suspension 30 mL  30 mL Oral Daily PRN Earnestine Leys, MD      . magnesium oxide (MAG-OX) tablet 400 mg  400 mg Oral BID Earnestine Leys, MD   400 mg at 09/22/15 1235  . meloxicam (MOBIC) tablet 15 mg  15 mg Oral Daily Earnestine Leys, MD      . menthol-cetylpyridinium (CEPACOL) lozenge 3 mg  1 lozenge Oral PRN Earnestine Leys, MD       Or  . phenol (CHLORASEPTIC) mouth spray 1 spray  1 spray Mouth/Throat PRN Earnestine Leys, MD      . methocarbamol (ROBAXIN) tablet 500 mg  500 mg Oral Q6H PRN Earnestine Leys, MD       Or  . methocarbamol (ROBAXIN) 500 mg in dextrose 5 % 50 mL IVPB  500 mg Intravenous Q6H PRN Earnestine Leys, MD      . metoCLOPramide (REGLAN) tablet 5-10 mg  5-10 mg Oral Q8H PRN Earnestine Leys, MD       Or  . metoCLOPramide (REGLAN) injection 5-10 mg  5-10 mg Intravenous Q8H PRN Earnestine Leys, MD      . morphine 2 MG/ML injection 2 mg  2 mg Intravenous Q2H PRN Earnestine Leys, MD   2 mg at 09/22/15 1609  . multivitamin-lutein (OCUVITE-LUTEIN) capsule 1 capsule  1 capsule Oral q morning - 10a Earnestine Leys, MD   1 capsule at 09/22/15 1234  . ondansetron (ZOFRAN) tablet 4 mg  4 mg Oral Q6H PRN Earnestine Leys, MD       Or  . ondansetron [facility]) injection 4 mg  4 mg Intravenous Q6H PRN Earnestine Leys, MD      . oxyCODONE (Oxy IR/ROXICODONE) immediate release tablet 5-10 mg  5-10 mg Oral Q3H PRN Earnestine Leys, MD   5 mg at 09/22/15 1340  . pantoprazole (PROTONIX) EC tablet 40 mg  40 mg Oral Daily Earnestine Leys, MD   40 mg at 09/22/15 1340  . potassium chloride (K-DUR,KLOR-CON) CR tablet 10 mEq  10 mEq Oral BID Earnestine Leys, MD   10 mEq at 09/22/15 1234  . pregabalin (LYRICA) capsule 75 mg  75 mg Oral BID Earnestine Leys, MD   75 mg at 09/22/15 1235  . senna (SENOKOT) tablet 8.6 mg  1 tablet Oral BID Earnestine Leys, MD   8.6  mg at 09/22/15 1340  . sodium chloride flush 0.9 % injection           .  sodium phosphate (FLEET) 7-19 GM/118ML enema 1 enema  1 enema Rectal Once PRN Earnestine Leys, MD      . tranexamic acid (CYKLOKAPRON) 1,000 mg in sodium chloride 0.9 % 100 mL IVPB  1,000 mg Intravenous Once Earnestine Leys, MD   1,000 mg at 09/22/15 1235  . vancomycin (VANCOCIN) 1,500 mg in sodium chloride 0.9 % 500 mL IVPB  1,500 mg Intravenous Q12H Earnestine Leys, MD      . vancomycin (VANCOCIN) 1,500 mg in sodium chloride 0.9 % 500 mL IVPB  1,500 mg Intravenous Q12H Earnestine Leys, MD      . vitamin E capsule 400 Units  400 Units Oral Daily Earnestine Leys, MD      . zolpidem (AMBIEN) tablet 5 mg  5 mg Oral QHS PRN Earnestine Leys, MD         Discharge Medications: Please see discharge summary for a list of discharge medications.  Relevant Imaging Results:  Relevant Lab Results:   Additional Information  (SSN: [id number])  Loralyn Freshwater, LCSW

## 2015-09-23 ENCOUNTER — Encounter: Payer: Self-pay | Admitting: Specialist

## 2015-09-23 LAB — BASIC METABOLIC PANEL WITH GFR
Anion gap: 4 — ABNORMAL LOW (ref 5–15)
BUN: 19 mg/dL (ref 6–20)
CO2: 24 mmol/L (ref 22–32)
Calcium: 7.9 mg/dL — ABNORMAL LOW (ref 8.9–10.3)
Chloride: 107 mmol/L (ref 101–111)
Creatinine, Ser: 0.77 mg/dL (ref 0.44–1.00)
GFR calc Af Amer: >60 mL/min
GFR calc non Af Amer: >60 mL/min
Glucose, Bld: 124 mg/dL — ABNORMAL HIGH (ref 65–99)
Potassium: 3.6 mmol/L (ref 3.5–5.1)
Sodium: 135 mmol/L (ref 135–145)

## 2015-09-23 LAB — CBC
HCT: 31.6 % — ABNORMAL LOW (ref 35.0–47.0)
Hemoglobin: 11 g/dL — ABNORMAL LOW (ref 12.0–16.0)
MCH: 32.9 pg (ref 26.0–34.0)
MCHC: 34.7 g/dL (ref 32.0–36.0)
MCV: 95 fL (ref 80.0–100.0)
Platelets: 153 K/uL (ref 150–440)
RBC: 3.33 MIL/uL — ABNORMAL LOW (ref 3.80–5.20)
RDW: 12.9 % (ref 11.5–14.5)
WBC: 6.6 K/uL (ref 3.6–11.0)

## 2015-09-23 NOTE — Anesthesia Postprocedure Evaluation (Signed)
Anesthesia Post Note  Patient: Erin Middleton  Procedure(s) Performed: Procedure(s) (LRB): TOTAL KNEE ARTHROPLASTY (Left)  Patient location during evaluation: Nursing Unit Anesthesia Type: Spinal Level of consciousness: awake, awake and alert, oriented and patient cooperative Pain management: pain level controlled Vital Signs Assessment: post-procedure vital signs reviewed and stable Respiratory status: spontaneous breathing, nonlabored ventilation and respiratory function stable Cardiovascular status: blood pressure returned to baseline Postop Assessment: no headache, no backache, patient able to bend at knees and no signs of nausea or vomiting Anesthetic complications: no    Last Vitals:  Filed Vitals:   09/23/15 0053 09/23/15 0337  BP: 129/47 94/39  Pulse: 62 63  Temp: 36.5 C 36.1 C  Resp: 14 15    Last Pain:  Filed Vitals:   09/23/15 0609  PainSc: 3                  Proofreader

## 2015-09-23 NOTE — Clinical Social Work Note (Signed)
Clinical Social Work Assessment  Patient Details  Name: Erin Middleton MRN: [id number] Date of Birth: 07-16-1945  Date of referral:  09/23/15               Reason for consult:  Facility Placement                Permission sought to share information with:  Chartered certified accountant granted to share information::  Yes, Verbal Permission Granted  Name::      [place]::     Relationship::     Contact Information:     Housing/Transportation Living arrangements for the past 2 months:  [place] of Information:  Patient, Adult Children Patient Interpreter Needed:  None Criminal Activity/Legal Involvement Pertinent to Current Situation/Hospitalization:  No - Comment as needed Significant Relationships:  Adult Children, Spouse Lives with:  Spouse Do you feel safe going back to the place where you live?  Yes Need for family participation in patient care:  Yes (Comment)  Care giving concerns:  Patient lives in [place] with her husband Shanon Brow.    Social Worker assessment / plan:  Holiday representative (CSW) received SNF consult. PT recommending SNF with likely progression to home health on post op day 0. CSW met with patient and her daughter Alyse Low was at bedside. CSW introduced self and explained role of CSW department. Patient was alert and oriented and sitting up in the bed. Patient reported that BCBS is primary and Medicare is secondary because she is still working. Patient reported that she lives in [place] with her husband Shanon Brow and her daughter Claiborne Billings lives in the house behind her. Patient's daughter Alyse Low at bedside lives in [place]. Patient reported that her plan is to return home with her husband and go to outpatient PT at Dr. Ammie Ferrier office. CSW explained that PT is recommending SNF on post op day 0 right after surgery. Patient is agreeable to SNF search and prefers [facility] CSW presented bed offers.  Patient chose [facility]. Per Kim admissions coordinator at [facility] she will start [facility] authorization today. CSW sent H&P to [facility]. CSW will continue to follow and assist as needed.   Employment status:  Engineer, mining information:  Medicare, [place] (BCBS is primary and Medicare is secondary. ) PT Recommendations:  [place] (PT recommending SNF with likely progression to home health ) Information / Referral to community resources:  [place], Other (Comment Required) (SNF VS. Home Health )  Patient/Family's Response to care: Patient is agreeable to going to [facility] or outpatient PT depending on PT's recommendation.   Patient/Family's Understanding of and Emotional Response to Diagnosis, Current Treatment, and Prognosis:  Patient and daughter were pleasant throughout assessment and thanked CSW for visit.   Emotional Assessment Appearance:  Appears stated age Attitude/Demeanor/Rapport:    Affect (typically observed):  Accepting, Adaptable, Pleasant Orientation:  Oriented to Self, Oriented to Place, Oriented to  Time, Oriented to Situation Alcohol / Substance use:  Not Applicable Psych involvement (Current and /or in the community):  No (Comment)  Discharge Needs  Concerns to be addressed:  Discharge Planning Concerns Readmission within the last 30 days:  No Current discharge risk:  Dependent with Mobility Barriers to Discharge:  Continued Medical Work up   Loralyn Freshwater, LCSW 09/23/2015, 9:18 AM

## 2015-09-23 NOTE — Progress Notes (Signed)
Physical Therapy Treatment Patient Details Name: Erin Middleton MRN: [id number] DOB: May 24, 1946 Today's Date: 09/23/2015    History of Present Illness Pt is a pleasant 70 y/o female that presents for L TKA on 09/22/2015.     PT Comments    Pt continues to require cueing for transfers, bed mobility and ambulation for sequence and techniques. Pt ambulates very slowly with heavy lean on rolling walker and difficulty finding rhythm. No loss of balance; however, pt notes head is "swimming". Pt fatigues quickly and unable to ambulate further. Discussed with pt/family on talking to nurse/MD regarding managing pain with medication with less side effects. Encouraged continued work on both flexion and extension stretching as well as quad strengthening. Pt wished return to bed at this time. Continue PT to progress strength, endurance, ambulation quality and distance to improve functional mobility. Recommend skilled nursing facility post discharge at this time due to difficulty with ambulation; pt also has 2 steps to enter the home. Discussed with social work as well.   Follow Up Recommendations  SNF     Equipment Recommendations  Rolling walker with 5" wheels    Recommendations for Other Services       Precautions / Restrictions Precautions Knee Immobilizer - Left: Other (comment) (Used KI for amb; off in chair; ankle roll for QS/stretch) Restrictions Weight Bearing Restrictions: Yes LLE Weight Bearing: Partial weight bearing    Mobility  Bed Mobility Overal bed mobility: Needs Assistance Bed Mobility: Sit to Supine     Supine to sit: Min assist Sit to supine: Min assist   General bed mobility comments: Cueing, attempts to use hook technieque; however, requirs assist (Cues for sequence, increased time and Min A for LLE off EOB)  Transfers Overall transfer level: Needs assistance Equipment used: Rolling walker (2 wheeled) Transfers: Sit to/from Stand (from recliner and commode) Sit to  Stand: Min assist         General transfer comment: cues for safe hand placement; increased assist from commode  Ambulation/Gait Ambulation/Gait assistance: Min guard Ambulation Distance (Feet): 30 Feet (second walk 35 ft) Assistive device: Rolling walker (2 wheeled) Gait Pattern/deviations: Step-to pattern;Decreased stride length;Decreased stance time - left;Decreased dorsiflexion - left;Decreased weight shift to left;Antalgic Gait velocity: slow Gait velocity interpretation: <1.8 ft/sec, indicative of risk for recurrent falls General Gait Details: Requires cues for sequence; heavy lean on rw. Difficulty finding rhythm   Stairs            Wheelchair Mobility    Modified Rankin (Stroke Patients Only)       Balance Overall balance assessment: Needs assistance Sitting-balance support: No upper extremity supported Sitting balance-Leahy Scale: Good     Standing balance support: Bilateral upper extremity supported Standing balance-Leahy Scale: Fair Standing balance comment: Fair standing balance  in prep for clothing negotiation.                    Cognition Arousal/Alertness: Awake/alert Behavior During Therapy: WFL for tasks assessed/performed Overall Cognitive Status: Within Functional Limits for tasks assessed                      Exercises Total Joint Exercises Ankle Circles/Pumps: AROM;Both;20 reps;Supine Quad Sets: Strengthening;Both;20 reps;Supine (20 again long sit; increased time/work to acquire) Knee Flexion: AAROM;Left;Other reps (comment);Seated (stretching work) Goniometric ROM: 8 to 65 Other Exercises Other Exercises: L gastroc stretching with sheet    General Comments General comments (skin integrity, edema, etc.): Bandage in place; drain removed; TENS in  place      Pertinent Vitals/Pain Pain Assessment: 0-10 Pain Score: 6  Pain Location: Left knee, particularly medially Pain Descriptors / Indicators: Aching;Constant Pain  Intervention(s): Monitored during session;Repositioned;Ice applied    Home Living Family/patient expects to be discharged to:: Private residence Living Arrangements: Spouse/significant other Available Help at Discharge: Family Type of Home: House Home Access: Stairs to enter Entrance Stairs-Rails: None Home Layout: One level Home Equipment: Shower seat      Prior Function Level of Independence: Independent      Comments: Patient still works at [employer], has difficulty with steps but does not use AD.    PT Goals (current goals can now be found in the care plan section) Acute Rehab PT Goals Patient Stated Goal: To be able to walk without pain. Progress towards PT goals: Progressing toward goals (slowly)    Frequency  BID    PT Plan Current plan remains appropriate    Co-evaluation             End of Session Equipment Utilized During Treatment: Gait belt;Left knee immobilizer Activity Tolerance: Patient tolerated treatment well Patient left: in chair;with chair alarm set;with call bell/phone within reach;with family/visitor present (polar care in place)     Time: [id number] PT Time Calculation (min) (ACUTE ONLY): 44 min  Charges:  $Gait Training: 8-22 mins $Therapeutic Exercise: 8-22 mins $Therapeutic Activity: 8-22 mins                    G Codes:      Charlaine Dalton, PTA 09/23/2015, 1:56 PM

## 2015-09-23 NOTE — Progress Notes (Signed)
Physical Therapy Treatment Patient Details Name: Erin Middleton MRN: [id number] DOB: [birth year] Today's Date: 09/23/2015    History of Present Illness Pt is a pleasant 70 y/o female that presents for L TKA on 09/22/2015.     PT Comments    Pt reports feeling better today; less nausea, no emesis and decreased pain. Pt able to demonstrate improved ability to ambulate albeit slow and cautious. Pt requires set up only for toileting and Min A on/off commode. Able to stand at sink and wash hands with Min guard. Pt with poor knee extension and quad set requiring increased time/cueing and feedback to acquire ability to quad set. Improving knee extension from -14 initially to -8 degrees by session's end. Deferred knee flexion at this time due to increased difficulty with knee extension; will address flexion in afternoon session. Pt given written exercise program and instructed to perform 20 ankle pumps and quad sets with 5 second hold every hour. Also instructed in left calf stretching 3 x 30 seconds. Pt/spouse demonstrate understanding. Pt up in chair comfortably. Continue PT in afternoon session progression active assisted range of motion, strengthening, endurance, and ambulation quality and distance to progress functional mobility and establish appropriate discharge planning.   Follow Up Recommendations  SNF (versus HHPT; TBD post afternoon session)     Equipment Recommendations  Rolling walker with 5" wheels    Recommendations for Other Services       Precautions / Restrictions Precautions Knee Immobilizer - Left: Other (comment) (Used KI for amb; off in chair; ankle roll for QS/stretch) Restrictions Weight Bearing Restrictions: Yes LLE Weight Bearing: Partial weight bearing    Mobility  Bed Mobility Overal bed mobility: Needs Assistance Bed Mobility: Supine to Sit     Supine to sit: Min assist     General bed mobility comments:  (Cues for sequence, increased time and Min A for LLE  off EOB)  Transfers Overall transfer level: Needs assistance Equipment used: Rolling walker (2 wheeled) Transfers: Sit to/from Stand (from bed and commode) Sit to Stand: Min assist         General transfer comment: cues for safe hand placement; increased assist from commode  Ambulation/Gait Ambulation/Gait assistance: Min guard Ambulation Distance (Feet): 14 Feet (second walk 30 ft) Assistive device: Rolling walker (2 wheeled) Gait Pattern/deviations: Step-to pattern;Decreased step length - right;Decreased step length - left;Decreased stance time - left;Decreased dorsiflexion - left (PWB L) Gait velocity: reduced Gait velocity interpretation: <1.8 ft/sec, indicative of risk for recurrent falls General Gait Details: Compliant with L PWB primarily due to pain. Short slow steps; stable   Stairs            Wheelchair Mobility    Modified Rankin (Stroke Patients Only)       Balance Overall balance assessment: Needs assistance Sitting-balance support: No upper extremity supported;Feet unsupported Sitting balance-Leahy Scale: Good     Standing balance support: Bilateral upper extremity supported Standing balance-Leahy Scale: Fair Standing balance comment: Improved                    Cognition Arousal/Alertness: Awake/alert Behavior During Therapy: WFL for tasks assessed/performed Overall Cognitive Status: Within Functional Limits for tasks assessed                      Exercises Total Joint Exercises Ankle Circles/Pumps: AROM;Both;20 reps;Supine Quad Sets: Strengthening;Both;20 reps;Supine (20 again long sit; increased time/work to acquire) Goniometric ROM: Iniitial -14 decreased to -8 post work  General Comments General comments (skin integrity, edema, etc.): Drain LLE      Pertinent Vitals/Pain Pain Assessment: 0-10 Pain Score: 4  Pain Location: L knee Pain Descriptors / Indicators: Aching;Constant Pain Intervention(s): Monitored during  session;Repositioned;Ice applied;RN gave pain meds during session    Home Living                      Prior Function            PT Goals (current goals can now be found in the care plan section) Progress towards PT goals: Progressing toward goals    Frequency  BID    PT Plan Current plan remains appropriate    Co-evaluation             End of Session Equipment Utilized During Treatment: Gait belt;Left knee immobilizer Activity Tolerance: Patient tolerated treatment well Patient left: in chair;with chair alarm set;with call bell/phone within reach;with family/visitor present (polar care in place)     Time: [id number] PT Time Calculation (min) (ACUTE ONLY): 46 min  Charges:  $Gait Training: 8-22 mins $Therapeutic Exercise: 8-22 mins $Therapeutic Activity: 8-22 mins                    G Codes:      Charlaine Dalton, PTA 09/23/2015, 10:56 AM

## 2015-09-23 NOTE — Progress Notes (Signed)
Subjective: 1 Day Post-Op Procedure(s) (LRB): TOTAL KNEE ARTHROPLASTY (Left)    Patient reports pain as mild. oob in chair.  Drain removed.  hgb stable .Marland Kitchen   Objective:   VITALS:   Filed Vitals:   09/23/15 0900 09/23/15 1611  BP:  114/42  Pulse: 68 75  Temp:  98.5 F (36.9 C)  Resp:  18    Neurologically intact Neurovascular intact Sensation intact distally Intact pulses distally Dorsiflexion/Plantar flexion intact dressing dry  LABS  Recent Labs  09/22/15 0620 09/22/15 1229 09/23/15 0435  HGB 13.9 12.6 11.0*  HCT 41.0 37.2 31.6*  WBC  --  10.2 6.6  PLT  --  183 153     Recent Labs  09/22/15 0620 09/22/15 1229 09/23/15 0435  NA 141  --  135  K 4.0  --  3.6  BUN  --   --  19  CREATININE  --  0.78 0.77  GLUCOSE 115*  --  124*    No results for input(s): LABPT, INR in the last 72 hours.   Assessment/Plan: 1 Day Post-Op Procedure(s) (LRB): TOTAL KNEE ARTHROPLASTY (Left)   PT Home with OPPT

## 2015-09-23 NOTE — Evaluation (Signed)
Occupational Therapy Evaluation Patient Details Name: Erin Middleton MRN: [id number] DOB: Apr 09, 1946 Today's Date: 09/23/2015    History of Present Illness Pt is a pleasant 70 y/o female that presents for L TKA on 09/22/2015.    Clinical Impression   Pt is 70 year old female s/p Left TKR.  Pt was independent in all ADLs prior to surgery and is eager to return to PLOF.  Pt currently requires moderate assist for LB dressing while in seated position due to pain and limited AROM of Left knee.  Pt would benefit from instruction in dressing techniques with or without assistive devices for dressing skills.  Pt would also benefit from recommendations for home modifications to increase safety in the bathroom and prevent falls. Will assess for OT Home Health needs as pt. progresses in therapy.      Follow Up Recommendations  Home health OT    Equipment Recommendations       Recommendations for Other Services       Precautions / Restrictions Precautions Knee Immobilizer - Left: Other (comment) (Used KI for amb; off in chair; ankle roll for QS/stretch) Restrictions Weight Bearing Restrictions: Yes LLE Weight Bearing: Partial weight bearing      Mobility Bed Mobility Overal bed mobility: Needs Assistance Bed Mobility: Supine to Sit     Supine to sit: Min assist     General bed mobility comments:  (Cues for sequence, increased time and Min A for LLE off EOB)  Transfers Overall transfer level: Needs assistance Equipment used: Rolling walker (2 wheeled) Transfers: Sit to/from Stand (from bed and commode) Sit to Stand: Min guard         General transfer comment: cues for safe hand placement; increased assist from commode    Balance Overall balance assessment: Needs assistance Sitting-balance support: No upper extremity supported;Feet unsupported Sitting balance-Leahy Scale: Good     Standing balance support: Bilateral upper extremity supported Standing balance-Leahy Scale:  Fair Standing balance comment: Fair standing balance  in prep for clothing negotiation.                            ADL  Pt. Requires minA for LE dressing with A/E use. MinA to stand and simulate clothing negotiation.                                             Vision  [facility], wears glasses.    Perception     Praxis      Pertinent Vitals/Pain Pain Assessment: 0-10 Pain Score: 5  Pain Location: Left Knee Pain Descriptors / Indicators: Aching;Constant Pain Intervention(s): Monitored during session     Hand Dominance     Extremity/Trunk Assessment Upper Extremity Assessment Upper Extremity Assessment: Overall WFL for tasks assessed           Communication Communication Communication: No difficulties   Cognition Arousal/Alertness: Awake/alert Behavior During Therapy: WFL for tasks assessed/performed Overall Cognitive Status: Within Functional Limits for tasks assessed                     General Comments       Exercises Exercises: Total Joint;Other exercises Other Exercises Other Exercises: L gastroc stretching with sheet   Shoulder Instructions      Home Living Family/patient expects to be discharged to:: Private residence Living Arrangements:  Spouse/significant other Available Help at Discharge: Family Type of Home: House Home Access: Stairs to enter Technical brewer of Steps: 3 Entrance Stairs-Rails: None Home Layout: One level     Bathroom Shower/Tub: Teacher, early years/pre: Standard     Home Equipment: Shower seat          Prior Functioning/Environment Level of Independence: Independent        Comments: Patient still works at [employer], has difficulty with steps but does not use AD.     OT Diagnosis: Generalized weakness   OT Problem List: Decreased strength;Decreased range of motion;Decreased activity tolerance;Impaired balance (sitting and/or standing)   OT  Treatment/Interventions:      OT Goals(Current goals can be found in the care plan section) Acute Rehab OT Goals Patient Stated Goal: To be able to walk without pain. ADL Goals Pt Will Perform Toileting - Clothing Manipulation and hygiene: with supervision Additional ADL Goal #1: Pt. will be ModI with LE dressing skills.  OT Frequency:     Barriers to D/C:            Co-evaluation              End of Session Equipment Utilized During Treatment: Rolling walker CPM Left Knee CPM Left Knee: Off  Activity Tolerance: Patient tolerated treatment well Patient left:  With chair alarm in place   Time: 1055-1130 OT Time Calculation (min): 35 min Charges:  OT General Charges $OT Visit: 1 Procedure OT Evaluation $OT Eval Low Complexity: 1 Procedure OT Treatments $Self Care/Home Management : 8-22 mins G-Codes:    Harrel Carina, MS, OTR/L Harrel Carina 09/23/2015, 12:01 PM

## 2015-09-23 NOTE — Progress Notes (Signed)
Per Maudie Mercury admissions coordinator at [facility] has requested post op day 2 PT notes before they make a decision about SNF coverage. Clinical Social Worker (CSW) will continue to follow and assist as needed.   Blima Bebee, LCSW [phone]

## 2015-09-23 NOTE — Clinical Social Work Placement (Signed)
   CLINICAL SOCIAL WORK PLACEMENT  NOTE  Date:  09/23/2015  Patient Details  Name: BIRDELL YING MRN: [id number] Date of Birth: Jul 05, 1945  Clinical Social Work is seeking post-discharge placement for this patient at the [place] level of care (*CSW will initial, date and re-position this form in  chart as items are completed):  Yes   Patient/family provided with [facility] Work Department's list of facilities offering this level of care within the geographic area requested by the patient (or if unable, by the patient's family).  Yes   Patient/family informed of their freedom to choose among providers that offer the needed level of care, that participate in Medicare, Medicaid or managed care program needed by the patient, have an available bed and are willing to accept the patient.  Yes   Patient/family informed of [facility]'s ownership interest in [facility] and [facility], as well as of the fact that they are under no obligation to receive care at these facilities.  PASRR submitted to EDS on 09/22/15     PASRR number received on 09/22/15     Existing PASRR number confirmed on       FL2 transmitted to all facilities in geographic area requested by pt/family on 09/22/15     FL2 transmitted to all facilities within larger geographic area on       Patient informed that his/her managed care company has contracts with or will negotiate with certain facilities, including the following:        Yes   Patient/family informed of bed offers received.  Patient chooses bed at  [facility] )     Physician recommends and patient chooses bed at      Patient to be transferred to   on  .  Patient to be transferred to facility by       Patient family notified on   of transfer.  Name of family member notified:        PHYSICIAN       Additional Comment:    _______________________________________________ Loralyn Freshwater,  LCSW 09/23/2015, 9:16 AM

## 2015-09-23 NOTE — Evaluation (Signed)
Occupational Therapy Evaluation Patient Details Name: Erin Middleton MRN: [id number] DOB: 01-09-1946 Today's Date: 09/23/2015    History of Present Illness Pt is a pleasant 70 y/o female that presents for L TKA on 09/22/2015.    Clinical Impression   Pt is 70 year old female s/p Left TKR.  Pt was independent in all ADLs prior to surgery and is eager to return to PLOF.  Pt currently requires minA assist for LB dressing while in a seated position, minA to stand in preparation for clothing negotiation due to pain and limited AROM of left knee.  Pt would benefit from continued instruction in dressing techniques with or without assistive devices for dressing.  Pt would also benefit from recommendations for home modifications to increase safety in the bathroom and prevent falls. Will continue to assess for OT home health needs as pt. progresses in therapy.      Follow Up Recommendations  Home health OT    Equipment Recommendations       Recommendations for Other Services       Precautions / Restrictions Precautions Knee Immobilizer - Left: Other (comment) (Used KI for amb; off in chair; ankle roll for QS/stretch) Restrictions Weight Bearing Restrictions: Yes LLE Weight Bearing: Partial weight bearing      Mobility Bed Mobility Overal bed mobility: Needs Assistance Bed Mobility: Supine to Sit     Supine to sit: Min assist     General bed mobility comments:  (Cues for sequence, increased time and Min A for LLE off EOB)  Transfers Overall transfer level: Needs assistance Equipment used: Rolling walker (2 wheeled) Transfers: Sit to/from Stand (from bed and commode) Sit to Stand: Min guard         General transfer comment: cues for safe hand placement; increased assist from commode    Balance Overall balance assessment: Needs assistance Sitting-balance support: No upper extremity supported;Feet unsupported Sitting balance-Leahy Scale: Good     Standing balance support:  Bilateral upper extremity supported Standing balance-Leahy Scale: Fair Standing balance comment: Fair standing balance  in prep for clothing negotiation.                            ADL  Pt. requires minA for A/E use for LE dressing including donning donning and doffing socks. MinA to stand for simulated clothing negotiation.                                              Vision  Intact, glasses for reading.   Perception     Praxis      Pertinent Vitals/Pain Pain Assessment: 0-10 Pain Score: 5  Pain Location: Left Knee Pain Descriptors / Indicators: Aching;Constant Pain Intervention(s): Monitored during session     Hand Dominance     Extremity/Trunk Assessment Upper Extremity Assessment Upper Extremity Assessment: Overall WFL for tasks assessed           Communication Communication Communication: No difficulties   Cognition Arousal/Alertness: Awake/alert Behavior During Therapy: WFL for tasks assessed/performed Overall Cognitive Status: Within Functional Limits for tasks assessed                     General Comments       Exercises Exercises: Total Joint;Other exercises Other Exercises Other Exercises: L gastroc stretching with sheet   Shoulder  Instructions      Home Living Family/patient expects to be discharged to:: Private residence Living Arrangements: Spouse/significant other Available Help at Discharge: Family Type of Home: House Home Access: Stairs to enter Technical brewer of Steps: 3 Entrance Stairs-Rails: None Home Layout: One level     Bathroom Shower/Tub: Teacher, early years/pre: Standard     Home Equipment: Shower seat          Prior Functioning/Environment Level of Independence: Independent        Comments: Patient still works at [employer], has difficulty with steps but does not use AD.     OT Diagnosis: Generalized weakness   OT Problem List: Decreased  strength;Decreased range of motion;Decreased activity tolerance;Impaired balance (sitting and/or standing)   OT Treatment/Interventions:      OT Goals(Current goals can be found in the care plan section) Acute Rehab OT Goals Patient Stated Goal: To be able to walk without pain.  OT Frequency:  Minimum of 1 time a week   Barriers to D/C:            Co-evaluation              End of Session Equipment Utilized During Treatment: Rolling walker CPM Left Knee CPM Left Knee: Off  Activity Tolerance: Patient tolerated treatment well Patient left:  In chair with alarm in place.   Time: 1055-1130 OT Time Calculation (min): 35 min Charges:  OT General Charges $OT Visit: 1 Procedure OT Evaluation $OT Eval Low Complexity: 1 Procedure OT Treatments $Self Care/Home Management : 8-22 mins G-Codes:    Harrel Carina, MS, OTR/L Harrel Carina 09/23/2015, 11:52 AM

## 2015-09-23 NOTE — Care Management (Signed)
Patient is not a Bundle Patient and has agreed to SNF if necessary however she would like to return home at discharge with outpatient PT plans as stated before. RNCM will continue to follow.

## 2015-09-24 LAB — CBC
HCT: 30.8 % — ABNORMAL LOW (ref 35.0–47.0)
HEMOGLOBIN: 10.7 g/dL — AB (ref 12.0–16.0)
MCH: 32.2 pg (ref 26.0–34.0)
MCHC: 34.6 g/dL (ref 32.0–36.0)
MCV: 92.9 fL (ref 80.0–100.0)
Platelets: 150 10*3/uL (ref 150–440)
RBC: 3.31 MIL/uL — ABNORMAL LOW (ref 3.80–5.20)
RDW: 12.8 % (ref 11.5–14.5)
WBC: 6.7 10*3/uL (ref 3.6–11.0)

## 2015-09-24 NOTE — Progress Notes (Signed)
Occupational Therapy Treatment Patient Details Name: Erin Middleton MRN: [id number] DOB: [birth year] Today's Date: 09/24/2015    History of present illness Pt is a pleasant 70 y/o female that presents for L TKA on 09/22/2015.    OT comments  Pt. Is a 70 y.o. Female who was admitted to [facility] for a Left TKR. Pt. Requires minA for LE dressing including donning and doffing pants , and socks with a reacher and sockaide. Pt. Is planning to return home tomorrow. HHOT services are recommended. Pt. Daughter was present. Pt. Continues to benefit from skilled OT intervention to improve ADL and IADL functioning.  Follow Up Recommendations  Home health OT    Equipment Recommendations       Recommendations for Other Services PT consult    Precautions / Restrictions Precautions Precautions: Knee;Fall Required Braces or Orthoses: Knee Immobilizer - Left Knee Immobilizer - Left: On when out of bed or walking Restrictions Weight Bearing Restrictions: Yes LLE Weight Bearing: Partial weight bearing       Mobility Bed Mobility  Pt requires CGA supine to sit to EOB in preparation for ADL tasks and A/E training.                Transfers                      Balance                                   ADL                                         General ADL Comments: Pt. requires minA donning pants with reacher, minA donning/doffing socks with reacher and sockaide. Cue for support of Left LE      Vision                     Perception     Praxis      Cognition                             Extremity/Trunk Assessment               Exercises     Shoulder Instructions       General Comments      Pertinent Vitals/ Pain          Home Living                                          Prior Functioning/Environment              Frequency Min 1X/week     Progress Toward Goals  OT  Goals(current goals can now be found in the care plan section)        Plan      Co-evaluation                 End of Session Equipment Utilized During Treatment: Rolling walker   Activity Tolerance Patient tolerated treatment well   Patient Left  (At EOB with PT)   Nurse Communication          [id number] OT  Time Calculation (min): 24 min  Charges: OT General Charges $OT Visit: 1 Procedure OT Treatments $Self Care/Home Management : 23-37 mins   Harrel Carina, MS, OTR/L   Harrel Carina 09/24/2015, 3:55 PM

## 2015-09-24 NOTE — Progress Notes (Signed)
Physical Therapy Treatment Patient Details Name: Erin Middleton MRN: [id number] DOB: 12-28-1945 Today's Date: 09/24/2015    History of Present Illness Pt is a pleasant 70 y/o female that presents for L TKA on 09/22/2015.     PT Comments    Pt continues to show some fear and hesitancy, but was better able to ambulate and perform mobility today.  She is also showing increased ROM.  Pt still weak in L quad and struggles with SAQ and SLR.  Pt able to walk nearly 100 ft with heavy reliance on the walker and a lot of cuing but has no safety concerns.  Pt continuing to make steady improvements.   Follow Up Recommendations  Home health PT     Equipment Recommendations  Rolling walker with 5" wheels    Recommendations for Other Services       Precautions / Restrictions Precautions Precautions: Fall;Knee Restrictions LLE Weight Bearing: Partial weight bearing    Mobility  Bed Mobility               General bed mobility comments: Pt in recliner on arrival, not performed  Transfers Overall transfer level: Modified independent Equipment used: Rolling walker (2 wheeled) Transfers: Sit to/from Stand Sit to Stand: Min guard         General transfer comment: Pt able to rise w/o direct physical assist, she needs reminders for foot and hand placement.  She is unable to control descent back into recliner.   Ambulation/Gait Ambulation/Gait assistance: Min guard Ambulation Distance (Feet): 95 Feet Assistive device: Rolling walker (2 wheeled)     Gait velocity interpretation: <1.8 ft/sec, indicative of risk for recurrent falls General Gait Details: Pt initially slow and hesitant, she does show increased cadence and confidence with increased distance with less reliance on the walker.  Pt shows good effort and responds well to cuing.    Stairs            Wheelchair Mobility    Modified Rankin (Stroke Patients Only)       Balance                                     Cognition Arousal/Alertness: Awake/alert Behavior During Therapy: WFL for tasks assessed/performed Overall Cognitive Status: Within Functional Limits for tasks assessed                      Exercises Total Joint Exercises Ankle Circles/Pumps: AROM;Both;20 reps;Supine Quad Sets: Strengthening;10 reps Gluteal Sets: Strengthening;10 reps Short Arc Quad: AROM;AAROM;10 reps Heel Slides: AAROM;Both;10 reps Hip ABduction/ADduction: Strengthening;10 reps Straight Leg Raises: AAROM;10 reps Knee Flexion: PROM;5 reps Goniometric ROM: 2-78    General Comments        Pertinent Vitals/Pain Pain Score: 5  (increases with exercises (specifically ROM acts))    Home Living                      Prior Function            PT Goals (current goals can now be found in the care plan section) Progress towards PT goals: Progressing toward goals    Frequency  BID    PT Plan Current plan remains appropriate    Co-evaluation             End of Session Equipment Utilized During Treatment: Gait belt;Left knee immobilizer Activity Tolerance: Patient tolerated treatment well  Patient left: with chair alarm set;with call bell/phone within reach;with family/visitor present     Time: 0916-0957 PT Time Calculation (min) (ACUTE ONLY): 41 min  Charges:  $Gait Training: 8-22 mins $Therapeutic Exercise: 8-22 mins $Therapeutic Activity: 8-22 mins                    G Codes:     Wayne Both, PT, DPT [phone]  Kreg Shropshire 09/24/2015, 10:37 AM

## 2015-09-24 NOTE — Progress Notes (Signed)
Subjective: 2 Days Post-Op Procedure(s) (LRB): TOTAL KNEE ARTHROPLASTY (Left)    Patient reports pain as mild. Still feels weak.  Walked further today.  Wants to go home tomorrow.  Objective:   VITALS:   Filed Vitals:   09/24/15 0328 09/24/15 0743  BP: 119/43 133/45  Pulse: 71 72  Temp: 98.1 F (36.7 C) 98.6 F (37 C)  Resp: 16     Neurologically intact Neurovascular intact Sensation intact distally Intact pulses distally dressing dry  LABS  Recent Labs  09/22/15 1229 09/23/15 0435 09/24/15 0440  HGB 12.6 11.0* 10.7*  HCT 37.2 31.6* 30.8*  WBC 10.2 6.6 6.7  PLT 183 153 150     Recent Labs  09/22/15 0620 09/22/15 1229 09/23/15 0435  NA 141  --  135  K 4.0  --  3.6  BUN  --   --  19  CREATININE  --  0.78 0.77  GLUCOSE 115*  --  124*    No results for input(s): LABPT, INR in the last 72 hours.   Assessment/Plan: 2 Days Post-Op Procedure(s) (LRB): TOTAL KNEE ARTHROPLASTY (Left)   Advance diet Up with therapy Plan for discharge tomorrow  OPPT next week

## 2015-09-24 NOTE — Progress Notes (Signed)
Physical Therapy Treatment Patient Details Name: Erin Middleton MRN: [id number] DOB: 1946-01-08 Today's Date: 09/24/2015    History of Present Illness Pt is a pleasant 70 y/o female that presents for L TKA on 09/22/2015.     PT Comments    Pt shows great effort and improvement with afternoon with increased gait speed, distance and confidence as well as ability to negotiate steps w/o assist.  She is able to do 10 SLRs and though she still has some pain with flexion ROM acts she does well with all exercises.   Follow Up Recommendations  Home health PT     Equipment Recommendations  Rolling walker with 5" wheels    Recommendations for Other Services       Precautions / Restrictions Precautions Precautions: Knee;Fall Required Braces or Orthoses: Knee Immobilizer - Left Knee Immobilizer - Left: On when out of bed or walking Restrictions Weight Bearing Restrictions: Yes LLE Weight Bearing: Partial weight bearing    Mobility  Bed Mobility Overal bed mobility: Modified Independent Bed Mobility: Sit to Supine       Sit to supine: Min guard   General bed mobility comments: Pt shows great effort getting back into bed and though she needs UEs to assist L LE into bed she was able to do so without physical assist  Transfers Overall transfer level: Modified independent Equipment used: Rolling walker (2 wheeled) Transfers: Sit to/from Stand Sit to Stand: Min guard         General transfer comment: Pt shows increased control and confidence getting to/from standing with minimal reliance on the walker.   Ambulation/Gait Ambulation/Gait assistance: Min guard Ambulation Distance (Feet): 150 Feet Assistive device: Rolling walker (2 wheeled)       General Gait Details: Pt walks with greatly increased confidence, speed and cadence and generally showed almost no hesitation.  She had some minimal fatigue with the effort but ultimately did very well.   Stairs Stairs: Yes Stairs  assistance: Min guard Stair Management: No rails Number of Stairs: 4 General stair comments: Retro stratgey with walker (PT assisting with AD use, but no physical assist to negotiate steps)  Wheelchair Mobility    Modified Rankin (Stroke Patients Only)       Balance                                    Cognition Arousal/Alertness: Awake/alert Behavior During Therapy: WFL for tasks assessed/performed Overall Cognitive Status: Within Functional Limits for tasks assessed                      Exercises Total Joint Exercises Ankle Circles/Pumps: AROM;Both;20 reps;Supine Quad Sets: Strengthening;10 reps Gluteal Sets: Strengthening;10 reps Short Arc Quad: AROM;AAROM;10 reps Heel Slides: AAROM;Both;10 reps Hip ABduction/ADduction: Strengthening;10 reps Straight Leg Raises: 10 reps;AROM Knee Flexion: PROM;5 reps    General Comments        Pertinent Vitals/Pain Pain Score: 4     Home Living                      Prior Function            PT Goals (current goals can now be found in the care plan section) Progress towards PT goals: Progressing toward goals    Frequency  BID    PT Plan Current plan remains appropriate    Co-evaluation  End of Session Equipment Utilized During Treatment: Gait belt;Left knee immobilizer Activity Tolerance: Patient tolerated treatment well Patient left: with chair alarm set;with call bell/phone within reach;with family/visitor present     Time: [id number] PT Time Calculation (min) (ACUTE ONLY): 32 min  Charges:  $Gait Training: 8-22 mins $Therapeutic Exercise: 8-22 mins                    G Codes:     Wayne Both, PT, DPT [phone]  Kreg Shropshire 09/24/2015, 5:25 PM

## 2015-09-24 NOTE — Care Management (Signed)
NEED RX FOR ROLLING WALKER. I have requested rolling walker from Will with [facility]. Patient still plans outpatient PT.

## 2015-09-24 NOTE — Progress Notes (Signed)
PT has changed recommendation to home health today. RN Case Manager is aware of above. Kim admissions coordinator at [facility] is aware of above. Plan is for patient to discharge home. Please reconsult if future social work needs arise. CSW signing off.   Blima Stjulien, LCSW [phone]

## 2015-09-25 LAB — CBC
HCT: 28.2 % — ABNORMAL LOW (ref 35.0–47.0)
Hemoglobin: 9.9 g/dL — ABNORMAL LOW (ref 12.0–16.0)
MCH: 32.3 pg (ref 26.0–34.0)
MCHC: 35 g/dL (ref 32.0–36.0)
MCV: 92.3 fL (ref 80.0–100.0)
PLATELETS: 155 10*3/uL (ref 150–440)
RBC: 3.06 MIL/uL — AB (ref 3.80–5.20)
RDW: 12.7 % (ref 11.5–14.5)
WBC: 6.7 10*3/uL (ref 3.6–11.0)

## 2015-09-25 MED ORDER — HYDROCODONE-ACETAMINOPHEN 7.5-325 MG PO TABS: 1.0000 | ORAL_TABLET | Freq: Four times a day (QID) | ORAL | Status: DC | PRN

## 2015-09-25 MED ORDER — ASPIRIN EC 325 MG PO TBEC: 325.0000 mg | DELAYED_RELEASE_TABLET | Freq: Two times a day (BID) | ORAL | Status: DC

## 2015-09-25 MED ORDER — MELOXICAM 15 MG PO TABS: 15.0000 mg | ORAL_TABLET | Freq: Every day | ORAL | Status: DC

## 2015-09-25 MED ORDER — GABAPENTIN 400 MG PO CAPS: 400.0000 mg | ORAL_CAPSULE | Freq: Three times a day (TID) | ORAL | Status: DC

## 2015-09-25 NOTE — Progress Notes (Signed)
Physical Therapy Treatment Patient Details Name: Erin Middleton MRN: [id number] DOB: [birth year] Today's Date: 09/25/2015    History of Present Illness Pt is a pleasant 70 y/o female that presents for L TKA on 09/22/2015.     PT Comments    Pt continues to make great improvements and though she still has some pot-op pain she has good ROM, mobility, ambulation and is able to negotiate up/down steps w/o only assist with AD.  Pt doing well and is expected to go home today.   Follow Up Recommendations  Home health PT     Equipment Recommendations  Rolling walker with 5" wheels    Recommendations for Other Services       Precautions / Restrictions Precautions Precautions: Knee;Fall Restrictions Weight Bearing Restrictions: Yes LLE Weight Bearing: Partial weight bearing    Mobility  Bed Mobility Overal bed mobility: Modified Independent Bed Mobility: Sit to Supine     Supine to sit: Supervision     General bed mobility comments: Pt still needing minimal UE assist to get L LE to EOB  Transfers Overall transfer level: Modified independent Equipment used: Rolling walker (2 wheeled) Transfers: Sit to/from Stand Sit to Stand: Supervision         General transfer comment: Pt needing only minimal cuing for set up and sequencing, able to rise w/o assist.  Ambulation/Gait Ambulation/Gait assistance: Supervision Ambulation Distance (Feet): 250 Feet Assistive device: Rolling walker (2 wheeled)       General Gait Details: Pt walks with great confidence and shows no hesitation during ambulation.  She is able to maintain forward momentum of the walker and ultimately does very well.    Stairs Stairs: Yes Stairs assistance: Supervision Stair Management: No rails Number of Stairs: 4 General stair comments: Pt able to negotiate steps with very little PT imput, only needing assist to hold walker for retro strategy.  Wheelchair Mobility    Modified Rankin (Stroke Patients  Only)       Balance                                    Cognition Arousal/Alertness: Awake/alert Behavior During Therapy: WFL for tasks assessed/performed Overall Cognitive Status: Within Functional Limits for tasks assessed                      Exercises Total Joint Exercises Ankle Circles/Pumps: AROM;Both;20 reps;Supine Quad Sets: Strengthening;10 reps Gluteal Sets: Strengthening;10 reps Short Arc Quad: AROM;AAROM;10 reps Heel Slides: AAROM;Both;10 reps Hip ABduction/ADduction: Strengthening;10 reps Straight Leg Raises: AAROM;10 reps Knee Flexion: PROM;5 reps Goniometric ROM: 0-85    General Comments        Pertinent Vitals/Pain Pain Score: 5     Home Living                      Prior Function            PT Goals (current goals can now be found in the care plan section) Progress towards PT goals: Progressing toward goals    Frequency  BID    PT Plan Current plan remains appropriate    Co-evaluation             End of Session Equipment Utilized During Treatment: Gait belt;Left knee immobilizer Activity Tolerance: Patient tolerated treatment well Patient left: with chair alarm set;with call bell/phone within reach;with family/visitor present     Time:  [id number] PT Time Calculation (min) (ACUTE ONLY): 28 min  Charges:  $Gait Training: 8-22 mins $Therapeutic Exercise: 8-22 mins                    G Codes:     Wayne Both, PT, DPT [phone]  Kreg Shropshire 09/25/2015, 10:52 AM

## 2015-09-25 NOTE — Progress Notes (Signed)
Pt escorted out by RN. Belongings packed and given to patient. Discharge summary reviewed, verbalized understanding. VS stable upon discharge. New Rx sheets given to patient.

## 2015-09-25 NOTE — Progress Notes (Signed)
Subjective: 3 Days Post-Op Procedure(s) (LRB): TOTAL KNEE ARTHROPLASTY (Left)    Patient reports pain as mild. Ready to go home.  Dressing dry.  No reddness.  Pain controlled.  Start OPPT next week and RTc 4 days  Objective:   VITALS:   Filed Vitals:   09/25/15 0600 09/25/15 0755  BP: 118/45 113/53  Pulse: 69 66  Temp: 98.1 F (36.7 C) 97.9 F (36.6 C)  Resp: 18 14    Neurologically intact Neurovascular intact Sensation intact distally Intact pulses distally Dorsiflexion/Plantar flexion intact  LABS  Recent Labs  09/23/15 0435 09/24/15 0440 09/25/15 0310  HGB 11.0* 10.7* 9.9*  HCT 31.6* 30.8* 28.2*  WBC 6.6 6.7 6.7  PLT 153 150 155     Recent Labs  09/22/15 1229 09/23/15 0435  NA  --  135  K  --  3.6  BUN  --  19  CREATININE 0.78 0.77  GLUCOSE  --  124*    No results for input(s): LABPT, INR in the last 72 hours.   Assessment/Plan: 3 Days Post-Op Procedure(s) (LRB): TOTAL KNEE ARTHROPLASTY (Left)   Plan:  D/C today

## 2015-09-25 NOTE — Discharge Summary (Signed)
Physician Discharge Summary  Patient ID: Erin Middleton MRN: [id number] DOB/AGE: 1946/04/23 70 y.o.  Admit date: 09/22/2015 Discharge date: 09/25/2015  Admission Diagnoses:  Discharge Diagnoses:  Active Problems:   Total knee replacement status   Discharged Condition: good  Hospital Course: The patient underwent left total knee replacement 09/22/2015. She did extremely well postoperatively. Hemoglobin remained stable. She made good progress with therapy and had minimal pain. She is discharged home and will start outpatient PT mild next week.  Consults: None  Significant Diagnostic Studies: radiology: Left knee x-rays  Treatments: IV hydration, antibiotics: vancomycin, analgesia: Vicodin, anticoagulation: LMW heparin and therapies: PT  Discharge Exam: Blood pressure 113/53, pulse 66, temperature 97.9 F (36.6 C), temperature source Oral, resp. rate 14, height 5\' 4"  (1.626 m), weight 73.483 kg (162 lb), SpO2 99 %. Extremities: no edema, redness or tenderness in the calves or thighs, no ulcers, gangrene or trophic changes and Dressing dry. Neurovascular status good distally. No redness or sign of infection.  Disposition: 01-Home or Self Care  Discharge Instructions    Call MD for:  persistant nausea and vomiting    Complete by:  As directed      Call MD for:  redness, tenderness, or signs of infection (pain, swelling, redness, odor or green/yellow discharge around incision site)    Complete by:  As directed      Call MD for:  severe uncontrolled pain    Complete by:  As directed      Call MD for:  temperature >100.4    Complete by:  As directed      Diet - low sodium heart healthy    Complete by:  As directed      Discharge instructions    Complete by:  As directed   Range of motion left knee every 2-3 hours 50% weightbearing Appointment 09/29/2015 my office Start outpatient PT next week. We will call U Monday morning.     Increase activity slowly    Complete by:  As  directed      Leave dressing on - Keep it clean, dry, and intact until clinic visit    Complete by:  As directed             Medication List    TAKE these medications        aspirin EC 325 MG tablet  Take 1 tablet (325 mg total) by mouth 2 (two) times daily.     calcium gluconate 650 MG tablet  Take 650 mg by mouth daily. Reported on 09/15/2015     cholecalciferol 1000 units tablet  Commonly known as:  VITAMIN D  Take 1,000 Units by mouth daily. Reported on 09/15/2015     gabapentin 400 MG capsule  Commonly known as:  NEURONTIN  Take 1 capsule (400 mg total) by mouth 3 (three) times daily.     HYDROcodone-acetaminophen 7.5-325 MG tablet  Commonly known as:  NORCO  Take 1 tablet by mouth every 6 (six) hours as needed for moderate pain.     losartan-hydrochlorothiazide 50-12.5 MG tablet  Commonly known as:  HYZAAR  Take 2 tablets by mouth daily.     magnesium oxide 400 MG tablet  Commonly known as:  MAG-OX  Take 400 mg by mouth 2 (two) times daily.     meloxicam 15 MG tablet  Commonly known as:  MOBIC  Take 15 mg by mouth daily. Reported on 09/15/2015     meloxicam 15 MG tablet  Commonly known as:  MOBIC  Take 1 tablet (15 mg total) by mouth daily.     omeprazole 20 MG capsule  Commonly known as:  PRILOSEC  TAKE ONE CAPSULE BY MOUTH DAILY     potassium chloride 10 MEQ tablet  Commonly known as:  K-DUR,KLOR-CON  Take 1 tablet (10 mEq total) by mouth 2 (two) times daily.     PRESERVISION/LUTEIN Caps  Take by mouth. Reported on 09/15/2015     vitamin E 400 UNIT capsule  Take 400 Units by mouth daily. Reported on 09/15/2015           Follow-up Information    Follow up with [facility] .   Specialty:  [place] information:   [address] [place] [phone]      Go to Park Breed, MD.   Specialty:  Specialist   Why:  For wound re-check   Contact information:   [place] [address] [phone]       Signed: Park Breed 09/25/2015, 8:39 AM

## 2015-09-25 NOTE — Care Management Note (Signed)
Case Management Note  Patient Details  Name: JAQUIRA EMCH MRN: [id number] Date of Birth: Oct 01, 1945  Subjective/Objective:       Discussed discharge planning with Mrs Decook. [facility]-PT has recommended home health PT. Mrs Ciotti states that she does not want that and that she has arranged with Dr Sanjuan Dame office to call her at home next week to arrange PT at Dr Sanjuan Dame office. Mrs Gilhooley already has a rolling walker for home use at bedside previously delivered by [employer].              Action/Plan:   Expected Discharge Date:                  Expected Discharge Plan:     In-House Referral:     Discharge planning Services  CM Consult  Post Acute Care Choice:    Choice offered to:  Patient, Adult Children  DME Arranged:    DME Agency:     HH Arranged:    [place] Agency:     Status of Service:  In process, will continue to follow  Medicare Important Message Given:    Date Medicare IM Given:    Medicare IM give by:    Date Additional Medicare IM Given:    Additional Medicare Important Message give by:     If discussed at [place] of Stay Meetings, dates discussed:    Additional Comments:  Sara Selvidge A, RN 09/25/2015, 9:03 AM

## 2015-09-26 ENCOUNTER — Other Ambulatory Visit: Payer: Self-pay | Admitting: Family Medicine

## 2015-09-27 DIAGNOSIS — M25662 Stiffness of left knee, not elsewhere classified: Secondary | ICD-10-CM | POA: Diagnosis not present

## 2015-09-27 DIAGNOSIS — M25562 Pain in left knee: Secondary | ICD-10-CM | POA: Diagnosis not present

## 2015-09-27 DIAGNOSIS — R2689 Other abnormalities of gait and mobility: Secondary | ICD-10-CM | POA: Diagnosis not present

## 2015-09-29 DIAGNOSIS — M1711 Unilateral primary osteoarthritis, right knee: Secondary | ICD-10-CM | POA: Diagnosis not present

## 2015-09-29 DIAGNOSIS — M179 Osteoarthritis of knee, unspecified: Secondary | ICD-10-CM | POA: Insufficient documentation

## 2015-09-29 DIAGNOSIS — Z96652 Presence of left artificial knee joint: Secondary | ICD-10-CM | POA: Diagnosis not present

## 2015-09-29 DIAGNOSIS — M170 Bilateral primary osteoarthritis of knee: Secondary | ICD-10-CM | POA: Diagnosis not present

## 2015-10-01 DIAGNOSIS — M25662 Stiffness of left knee, not elsewhere classified: Secondary | ICD-10-CM | POA: Diagnosis not present

## 2015-10-01 DIAGNOSIS — M25562 Pain in left knee: Secondary | ICD-10-CM | POA: Diagnosis not present

## 2015-10-06 DIAGNOSIS — M25662 Stiffness of left knee, not elsewhere classified: Secondary | ICD-10-CM | POA: Diagnosis not present

## 2015-10-06 DIAGNOSIS — M25562 Pain in left knee: Secondary | ICD-10-CM | POA: Diagnosis not present

## 2015-10-08 DIAGNOSIS — Z96652 Presence of left artificial knee joint: Secondary | ICD-10-CM | POA: Diagnosis not present

## 2015-10-08 DIAGNOSIS — M25562 Pain in left knee: Secondary | ICD-10-CM | POA: Diagnosis not present

## 2015-10-08 DIAGNOSIS — M25662 Stiffness of left knee, not elsewhere classified: Secondary | ICD-10-CM | POA: Diagnosis not present

## 2015-10-11 DIAGNOSIS — M25662 Stiffness of left knee, not elsewhere classified: Secondary | ICD-10-CM | POA: Diagnosis not present

## 2015-10-11 DIAGNOSIS — M25562 Pain in left knee: Secondary | ICD-10-CM | POA: Diagnosis not present

## 2015-10-15 DIAGNOSIS — M6281 Muscle weakness (generalized): Secondary | ICD-10-CM | POA: Diagnosis not present

## 2015-10-19 DIAGNOSIS — M25562 Pain in left knee: Secondary | ICD-10-CM | POA: Diagnosis not present

## 2015-10-19 DIAGNOSIS — M25662 Stiffness of left knee, not elsewhere classified: Secondary | ICD-10-CM | POA: Diagnosis not present

## 2015-10-21 ENCOUNTER — Other Ambulatory Visit: Payer: Self-pay | Admitting: Family Medicine

## 2015-10-21 DIAGNOSIS — M25662 Stiffness of left knee, not elsewhere classified: Secondary | ICD-10-CM | POA: Diagnosis not present

## 2015-10-21 DIAGNOSIS — M25562 Pain in left knee: Secondary | ICD-10-CM | POA: Diagnosis not present

## 2015-10-26 DIAGNOSIS — M25662 Stiffness of left knee, not elsewhere classified: Secondary | ICD-10-CM | POA: Diagnosis not present

## 2015-10-26 DIAGNOSIS — M25562 Pain in left knee: Secondary | ICD-10-CM | POA: Diagnosis not present

## 2015-10-29 DIAGNOSIS — M25562 Pain in left knee: Secondary | ICD-10-CM | POA: Diagnosis not present

## 2015-10-29 DIAGNOSIS — M25662 Stiffness of left knee, not elsewhere classified: Secondary | ICD-10-CM | POA: Diagnosis not present

## 2015-11-02 ENCOUNTER — Other Ambulatory Visit: Payer: Self-pay | Admitting: Family Medicine

## 2015-11-02 DIAGNOSIS — M25562 Pain in left knee: Secondary | ICD-10-CM | POA: Diagnosis not present

## 2015-11-02 DIAGNOSIS — M25662 Stiffness of left knee, not elsewhere classified: Secondary | ICD-10-CM | POA: Diagnosis not present

## 2015-11-04 DIAGNOSIS — M25562 Pain in left knee: Secondary | ICD-10-CM | POA: Diagnosis not present

## 2015-11-04 DIAGNOSIS — M25662 Stiffness of left knee, not elsewhere classified: Secondary | ICD-10-CM | POA: Diagnosis not present

## 2015-11-10 DIAGNOSIS — M25562 Pain in left knee: Secondary | ICD-10-CM | POA: Diagnosis not present

## 2015-11-10 DIAGNOSIS — M25662 Stiffness of left knee, not elsewhere classified: Secondary | ICD-10-CM | POA: Diagnosis not present

## 2015-11-11 ENCOUNTER — Encounter: Payer: Self-pay | Admitting: Family Medicine

## 2015-11-11 ENCOUNTER — Ambulatory Visit (INDEPENDENT_AMBULATORY_CARE_PROVIDER_SITE_OTHER): Payer: BC Managed Care – PPO | Admitting: Family Medicine

## 2015-11-11 VITALS — BP 142/60 | HR 72 | Temp 97.6°F | Resp 16 | Ht 65.0 in | Wt 159.0 lb

## 2015-11-11 DIAGNOSIS — M791 Myalgia, unspecified site: Secondary | ICD-10-CM

## 2015-11-11 DIAGNOSIS — I10 Essential (primary) hypertension: Secondary | ICD-10-CM

## 2015-11-11 NOTE — Progress Notes (Signed)
Patient ID: Erin Middleton, female   DOB: Aug 13, 1945, 70 y.o.   MRN: [id number]       Patient: Erin Middleton Female    DOB: 07/16/1945   70 y.o.   MRN: [id number] Visit Date: 11/11/2015  Today's Provider: Wilhemena Durie, MD   Chief Complaint  Patient presents with  . Arthritis  . Hypokalemia   Subjective:    HPI  Patient comes in today for a follow up. She was last seen on 09/15/15 and was advised to start Potassium supplement and Magnesium Oxide twice daily. Patient reports that she has had a total knee replacement (by Dr. Sabra Heck) since she was last seen, and reports that she she has not been taking supplements since then.    No Known Allergies Previous Medications   ASPIRIN EC 325 MG TABLET    Take 1 tablet (325 mg total) by mouth 2 (two) times daily.   CALCIUM GLUCONATE 650 MG TABLET    Take 650 mg by mouth daily. Reported on 09/15/2015   CHOLECALCIFEROL (VITAMIN D) 1000 UNITS TABLET    Take 1,000 Units by mouth daily. Reported on 09/15/2015   GABAPENTIN (NEURONTIN) 400 MG CAPSULE    Take 1 capsule (400 mg total) by mouth 3 (three) times daily.   HYDROCODONE-ACETAMINOPHEN (NORCO) 7.5-325 MG TABLET    Take 1 tablet by mouth every 6 (six) hours as needed for moderate pain.   KLOR-CON M10 10 MEQ TABLET    TAKE 1 TABLET (10 MEQ TOTAL) BY MOUTH 2 (TWO) TIMES DAILY.   LOSARTAN-HYDROCHLOROTHIAZIDE (HYZAAR) 50-12.5 MG TABLET    Take 2 tablets by mouth daily.   MAGNESIUM OXIDE (MAG-OX) 400 MG TABLET    Take 400 mg by mouth 2 (two) times daily.   MELOXICAM (MOBIC) 15 MG TABLET    Take 15 mg by mouth daily. Reported on 11/11/2015   MELOXICAM (MOBIC) 15 MG TABLET    Take 1 tablet (15 mg total) by mouth daily.   MULTIPLE VITAMINS-MINERALS (PRESERVISION/LUTEIN) CAPS    Take by mouth. Reported on 09/15/2015   OMEPRAZOLE (PRILOSEC) 20 MG CAPSULE    TAKE ONE CAPSULE BY MOUTH DAILY   VITAMIN E 400 UNIT CAPSULE    Take 400 Units by mouth daily. Reported on 09/15/2015    Review of Systems    Constitutional: Negative.   Respiratory: Negative.   Cardiovascular: Negative.   Musculoskeletal: Positive for back pain, joint swelling, arthralgias and gait problem. Negative for myalgias, neck pain and neck stiffness.  Skin: Negative.     Social History  Substance Use Topics  . Smoking status: Never Smoker   . Smokeless tobacco: Not on file  . Alcohol Use: 0.0 oz/week    0 Standard drinks or equivalent per week     Comment: very seldom   Objective:   BP 142/60 mmHg  Pulse 72  Temp(Src) 97.6 F (36.4 C)  Resp 16  Ht 5\' 5"  (1.651 m)  Wt 159 lb (72.122 kg)  BMI 26.46 kg/m2  Physical Exam  Constitutional: She is oriented to person, place, and time. She appears well-developed and well-nourished.  Cardiovascular: Normal rate, regular rhythm and normal heart sounds.   Pulmonary/Chest: Effort normal and breath sounds normal.  Musculoskeletal: She exhibits edema.  Trace edema in left leg.   Neurological: She is alert and oriented to person, place, and time.  Skin: Skin is warm and dry.  Psychiatric: She has a normal mood and affect. Her behavior is normal. Judgment and thought content  normal.        Assessment & Plan:     1. Essential hypertension  - Comprehensive metabolic panel  2. Myalgia  - Comprehensive metabolic panel  3. GERD  4. Low magnesium  Most likely due to treatment of GERD  5. Hypokalemia  Should resolve with treatment of low magnesium I have done the exam and reviewed the above chart and it is accurate to the best of my knowledge.        Carmisha Larusso Cranford Mon, MD  [facility]

## 2015-11-11 NOTE — Patient Instructions (Signed)
If cramps continue, may take Magnesium Oxide 400mg  twice daily.

## 2015-11-12 DIAGNOSIS — M25662 Stiffness of left knee, not elsewhere classified: Secondary | ICD-10-CM | POA: Diagnosis not present

## 2015-11-12 DIAGNOSIS — M25562 Pain in left knee: Secondary | ICD-10-CM | POA: Diagnosis not present

## 2015-11-12 LAB — COMPREHENSIVE METABOLIC PANEL
A/G RATIO: 1.6 (ref 1.2–2.2)
ALBUMIN: 4.5 g/dL (ref 3.5–4.8)
ALT: 19 IU/L (ref 0–32)
AST: 23 IU/L (ref 0–40)
Alkaline Phosphatase: 81 IU/L (ref 39–117)
BUN / CREAT RATIO: 23 (ref 12–28)
BUN: 21 mg/dL (ref 8–27)
Bilirubin Total: 0.5 mg/dL (ref 0.0–1.2)
CALCIUM: 10.7 mg/dL — AB (ref 8.7–10.3)
CO2: 26 mmol/L (ref 18–29)
Chloride: 97 mmol/L (ref 96–106)
Creatinine, Ser: 0.91 mg/dL (ref 0.57–1.00)
GFR, EST AFRICAN AMERICAN: 73 mL/min/{1.73_m2} (ref >59)
GFR, EST NON AFRICAN AMERICAN: 63 mL/min/{1.73_m2} (ref >59)
GLOBULIN, TOTAL: 2.8 g/dL (ref 1.5–4.5)
Glucose: 98 mg/dL (ref 65–99)
POTASSIUM: 4.4 mmol/L (ref 3.5–5.2)
SODIUM: 140 mmol/L (ref 134–144)
Total Protein: 7.3 g/dL (ref 6.0–8.5)

## 2015-11-17 DIAGNOSIS — M25662 Stiffness of left knee, not elsewhere classified: Secondary | ICD-10-CM | POA: Diagnosis not present

## 2015-11-17 DIAGNOSIS — M25562 Pain in left knee: Secondary | ICD-10-CM | POA: Diagnosis not present

## 2015-11-24 DIAGNOSIS — M25562 Pain in left knee: Secondary | ICD-10-CM | POA: Diagnosis not present

## 2015-11-24 DIAGNOSIS — M25662 Stiffness of left knee, not elsewhere classified: Secondary | ICD-10-CM | POA: Diagnosis not present

## 2015-12-07 DIAGNOSIS — M25562 Pain in left knee: Secondary | ICD-10-CM | POA: Diagnosis not present

## 2015-12-07 DIAGNOSIS — M25662 Stiffness of left knee, not elsewhere classified: Secondary | ICD-10-CM | POA: Diagnosis not present

## 2015-12-14 DIAGNOSIS — M25662 Stiffness of left knee, not elsewhere classified: Secondary | ICD-10-CM | POA: Diagnosis not present

## 2015-12-14 DIAGNOSIS — M25562 Pain in left knee: Secondary | ICD-10-CM | POA: Diagnosis not present

## 2015-12-21 DIAGNOSIS — M25562 Pain in left knee: Secondary | ICD-10-CM | POA: Diagnosis not present

## 2015-12-21 DIAGNOSIS — M25662 Stiffness of left knee, not elsewhere classified: Secondary | ICD-10-CM | POA: Diagnosis not present

## 2016-01-19 DIAGNOSIS — Z96652 Presence of left artificial knee joint: Secondary | ICD-10-CM | POA: Diagnosis not present

## 2016-02-10 ENCOUNTER — Ambulatory Visit (INDEPENDENT_AMBULATORY_CARE_PROVIDER_SITE_OTHER): Payer: BC Managed Care – PPO | Admitting: Family Medicine

## 2016-02-10 ENCOUNTER — Encounter: Payer: Self-pay | Admitting: Family Medicine

## 2016-02-10 DIAGNOSIS — M8580 Other specified disorders of bone density and structure, unspecified site: Secondary | ICD-10-CM

## 2016-02-10 DIAGNOSIS — Z780 Asymptomatic menopausal state: Secondary | ICD-10-CM

## 2016-02-10 NOTE — Progress Notes (Signed)
Patient: Erin Middleton Female    DOB: 02/12/1946   70 y.o.   MRN: [id number] Visit Date: 02/10/2016  Today's Provider: Wilhemena Durie, MD   Chief Complaint  Patient presents with  . hypercalcemia    3 month follow up.    Subjective:    HPI Patient comes in today for a follow up on elevated calcium levels. On 11/11/2015 patient's calcium level was 10.7. Patient was advised to discontinue all calcium supplements, and she reports that she has not been taking any. Patient reports that she has still been taking magnesium and potassium supplements.  Patient is concerned about the possibility of osteoporosis and her head leaning forward as she gets older. She has full range of motion in her neck and no concerns for cervical spine disease.    No Known Allergies Current Meds  Medication Sig  . aspirin EC 325 MG tablet Take 1 tablet (325 mg total) by mouth 2 (two) times daily.  . calcium gluconate 650 MG tablet Take 650 mg by mouth daily. Reported on 09/15/2015  . gabapentin (NEURONTIN) 400 MG capsule Take 1 capsule (400 mg total) by mouth 3 (three) times daily.  Marland Kitchen HYDROcodone-acetaminophen (NORCO) 7.5-325 MG tablet Take 1 tablet by mouth every 6 (six) hours as needed for moderate pain.  Marland Kitchen KLOR-CON M10 10 MEQ tablet TAKE 1 TABLET (10 MEQ TOTAL) BY MOUTH 2 (TWO) TIMES DAILY.  Marland Kitchen losartan-hydrochlorothiazide (HYZAAR) 50-12.5 MG tablet Take 2 tablets by mouth daily.  . magnesium oxide (MAG-OX) 400 MG tablet Take 400 mg by mouth 2 (two) times daily.  . meloxicam (MOBIC) 15 MG tablet Take 15 mg by mouth daily. Reported on 11/11/2015  . Multiple Vitamins-Minerals (PRESERVISION/LUTEIN) CAPS Take by mouth. Reported on 09/15/2015  . omeprazole (PRILOSEC) 20 MG capsule TAKE ONE CAPSULE BY MOUTH DAILY  . vitamin E 400 UNIT capsule Take 400 Units by mouth daily. Reported on 09/15/2015    Review of Systems  Constitutional: Negative.   Eyes: Negative.   Respiratory: Negative.   Cardiovascular:  Negative for chest pain, palpitations and leg swelling.  Endocrine: Negative.   Musculoskeletal: Positive for arthralgias, joint swelling and myalgias.       Had knee surgery 4 months ago.   Neurological: Negative.     Social History  Substance Use Topics  . Smoking status: Never Smoker  . Smokeless tobacco: Not on file  . Alcohol use 0.0 oz/week     Comment: very seldom   Objective:   BP 128/78 (BP Location: Right Arm, Patient Position: Sitting, Cuff Size: Normal)   Temp 98.6 F (37 C)   Resp 16   Wt 154 lb (69.9 kg)   BMI 25.63 kg/m   Physical Exam  Constitutional: She is oriented to person, place, and time. She appears well-developed and well-nourished.  HENT:  Head: Normocephalic and atraumatic.  Right Ear: External ear normal.  Left Ear: External ear normal.  Nose: Nose normal.  Eyes: Conjunctivae are normal.  Neck: Neck supple. No thyromegaly present.  Cardiovascular: Normal rate, regular rhythm and normal heart sounds.   Pulmonary/Chest: Effort normal and breath sounds normal.  Musculoskeletal:  Mild increased thoracic kyphosis. Small dowagers hump  Lymphadenopathy:    She has no cervical adenopathy.  Neurological: She is alert and oriented to person, place, and time.  Skin: Skin is warm and dry.  Psychiatric: She has a normal mood and affect. Her behavior is normal. Judgment and thought content normal.  Assessment & Plan:     1. Hypercalcemia  - PTH, Intact and Calcium - Calcium, ionized  2. Osteopenia Discussed the disease of osteoporosis. Discussed regular exercise and core fitness it would be the best way to try to prevent see progression as she ages.  3. Postmenopausal  - DG Bone Density; Future 4. History of breast cancer BRCA testing was arranged a couple of years ago and patient never had it drawn. She has daughters and grandchildren. We'll be her best interest to get her status evaluated for them.      Dynasti Kerman Cranford Mon, MD    [facility]

## 2016-02-11 LAB — PTH, INTACT AND CALCIUM
CALCIUM: 10 mg/dL (ref 8.7–10.3)
PTH: 22 pg/mL (ref 15–65)

## 2016-02-11 LAB — CALCIUM, IONIZED: CALCIUM ION: 5.5 mg/dL (ref 4.5–5.6)

## 2016-02-14 ENCOUNTER — Ambulatory Visit: Payer: Self-pay | Admitting: Family Medicine

## 2016-02-15 ENCOUNTER — Telehealth: Payer: Self-pay

## 2016-02-15 NOTE — Telephone Encounter (Signed)
Pt advised.   Thanks,   -Milee Qualls  

## 2016-02-15 NOTE — Telephone Encounter (Signed)
-----   Message from Jerrol Banana., MD sent at 02/11/2016  4:04 PM EDT ----- Lab good--stay off of calcium supplement.

## 2016-02-23 ENCOUNTER — Ambulatory Visit: Payer: Medicare Other

## 2016-03-14 ENCOUNTER — Ambulatory Visit
Admission: RE | Admit: 2016-03-14 | Discharge: 2016-03-14 | Disposition: A | Discharge status: Home / Self Care | Payer: Medicare Other | Source: Ambulatory Visit | Attending: Family Medicine | Admitting: Family Medicine

## 2016-03-14 DIAGNOSIS — M85852 Other specified disorders of bone density and structure, left thigh: Secondary | ICD-10-CM | POA: Insufficient documentation

## 2016-03-14 DIAGNOSIS — M85832 Other specified disorders of bone density and structure, left forearm: Secondary | ICD-10-CM | POA: Insufficient documentation

## 2016-03-14 DIAGNOSIS — Z780 Asymptomatic menopausal state: Secondary | ICD-10-CM | POA: Diagnosis not present

## 2016-03-14 DIAGNOSIS — Z13820 Encounter for screening for osteoporosis: Secondary | ICD-10-CM | POA: Insufficient documentation

## 2016-03-14 DIAGNOSIS — M85862 Other specified disorders of bone density and structure, left lower leg: Secondary | ICD-10-CM | POA: Diagnosis not present

## 2016-04-08 IMAGING — MG MM DIGITAL SCREENING UNILAT*R* W/ CAD
3 series · 3 of 3 positions shown · non-contrast
Comparison: Previous exam(s).

CLINICAL DATA: Screening.

EXAM:
DIGITAL SCREENING UNILATERAL RIGHT MAMMOGRAM WITH CAD

[R MLO (1 of 2)]
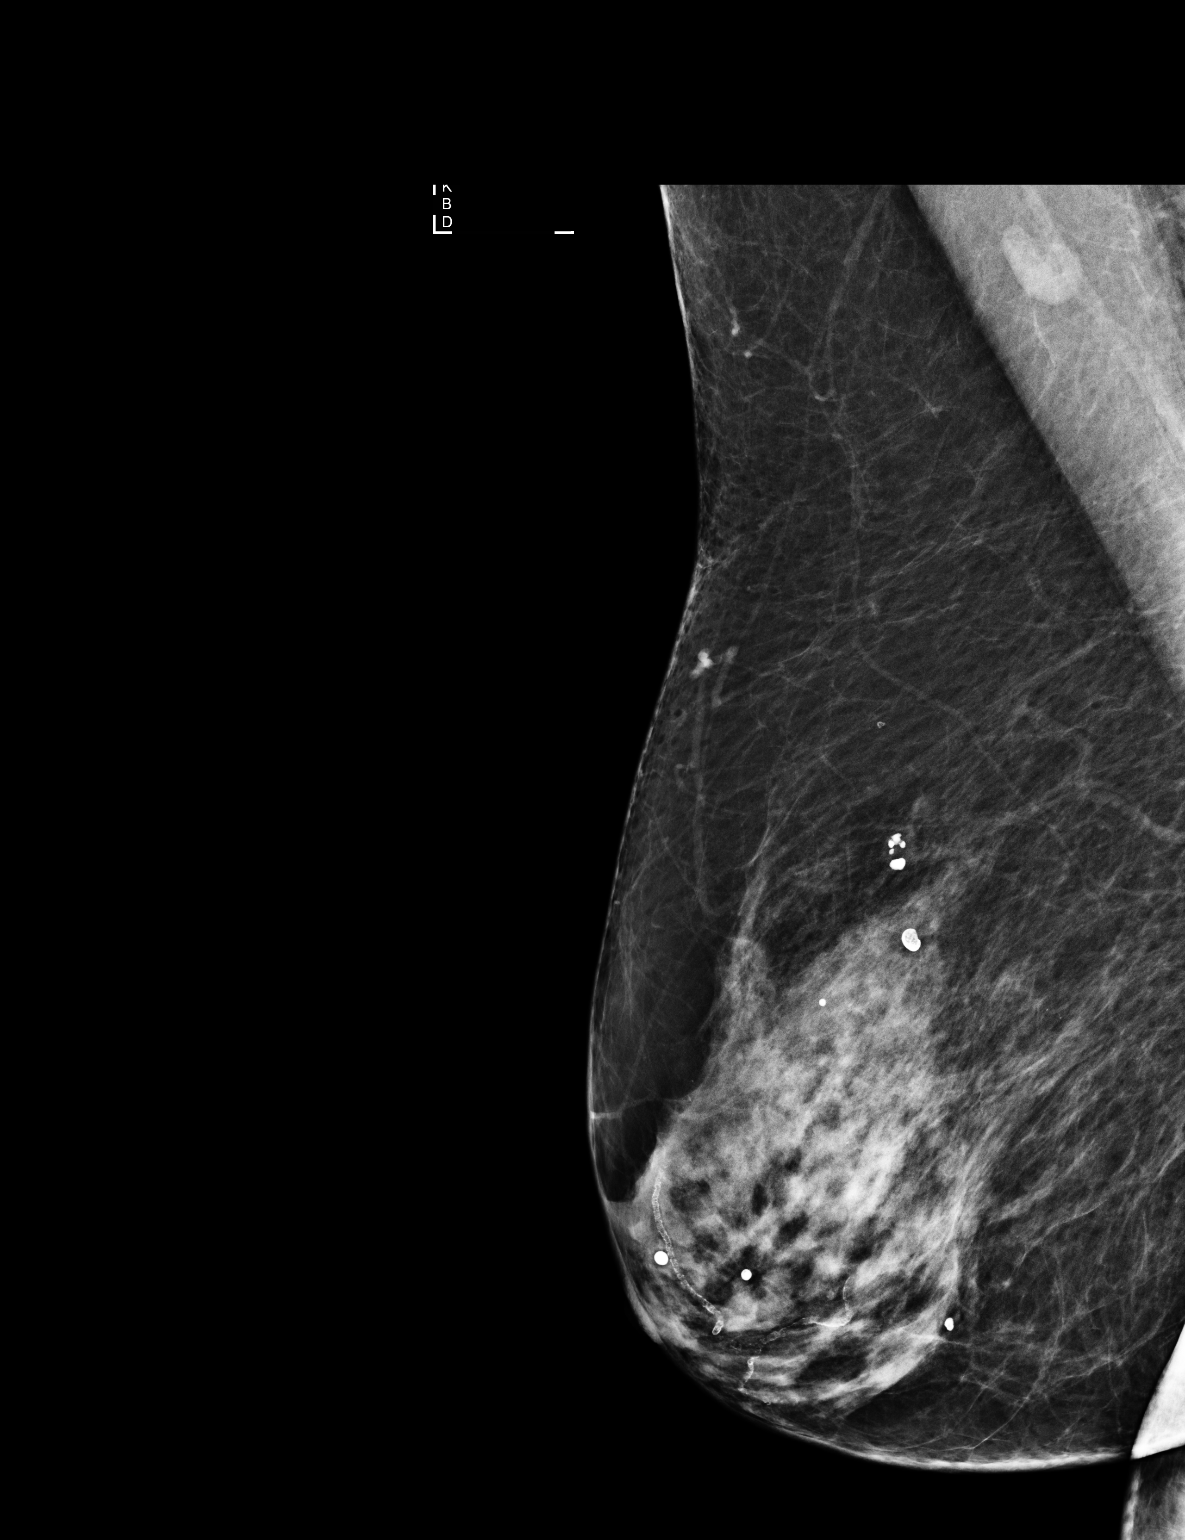

[R MLO (2 of 2)]
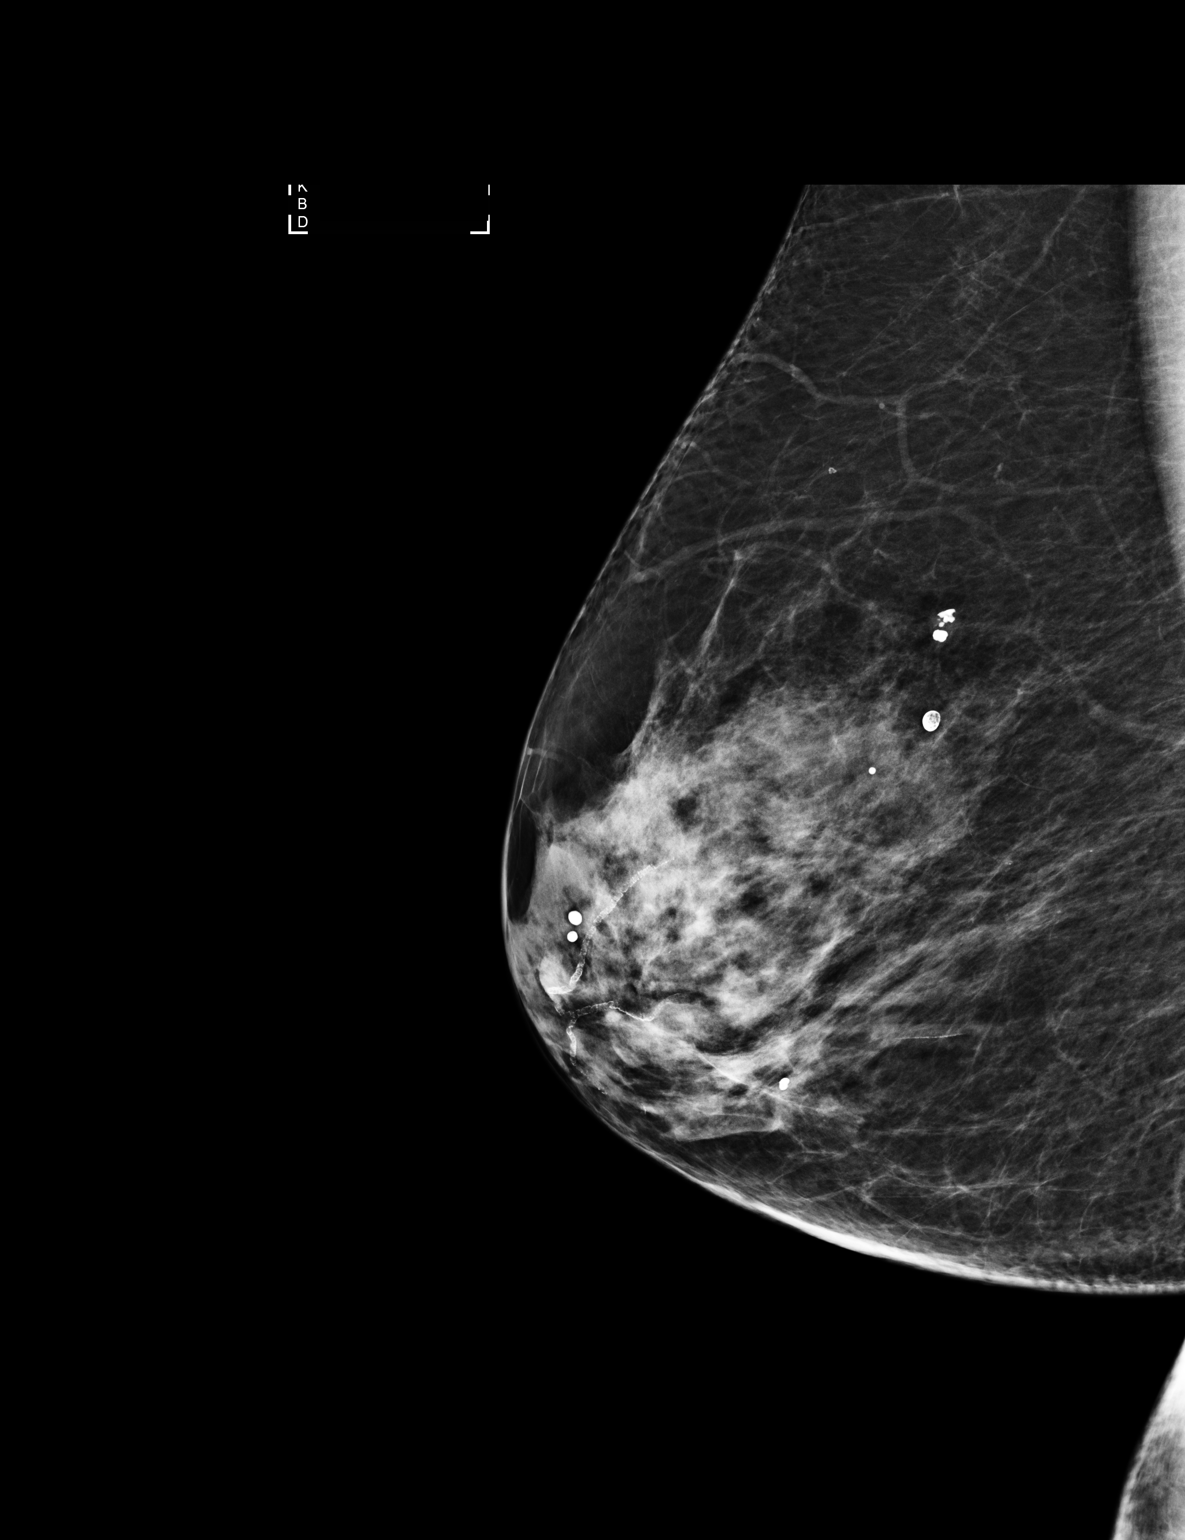

[R CC]
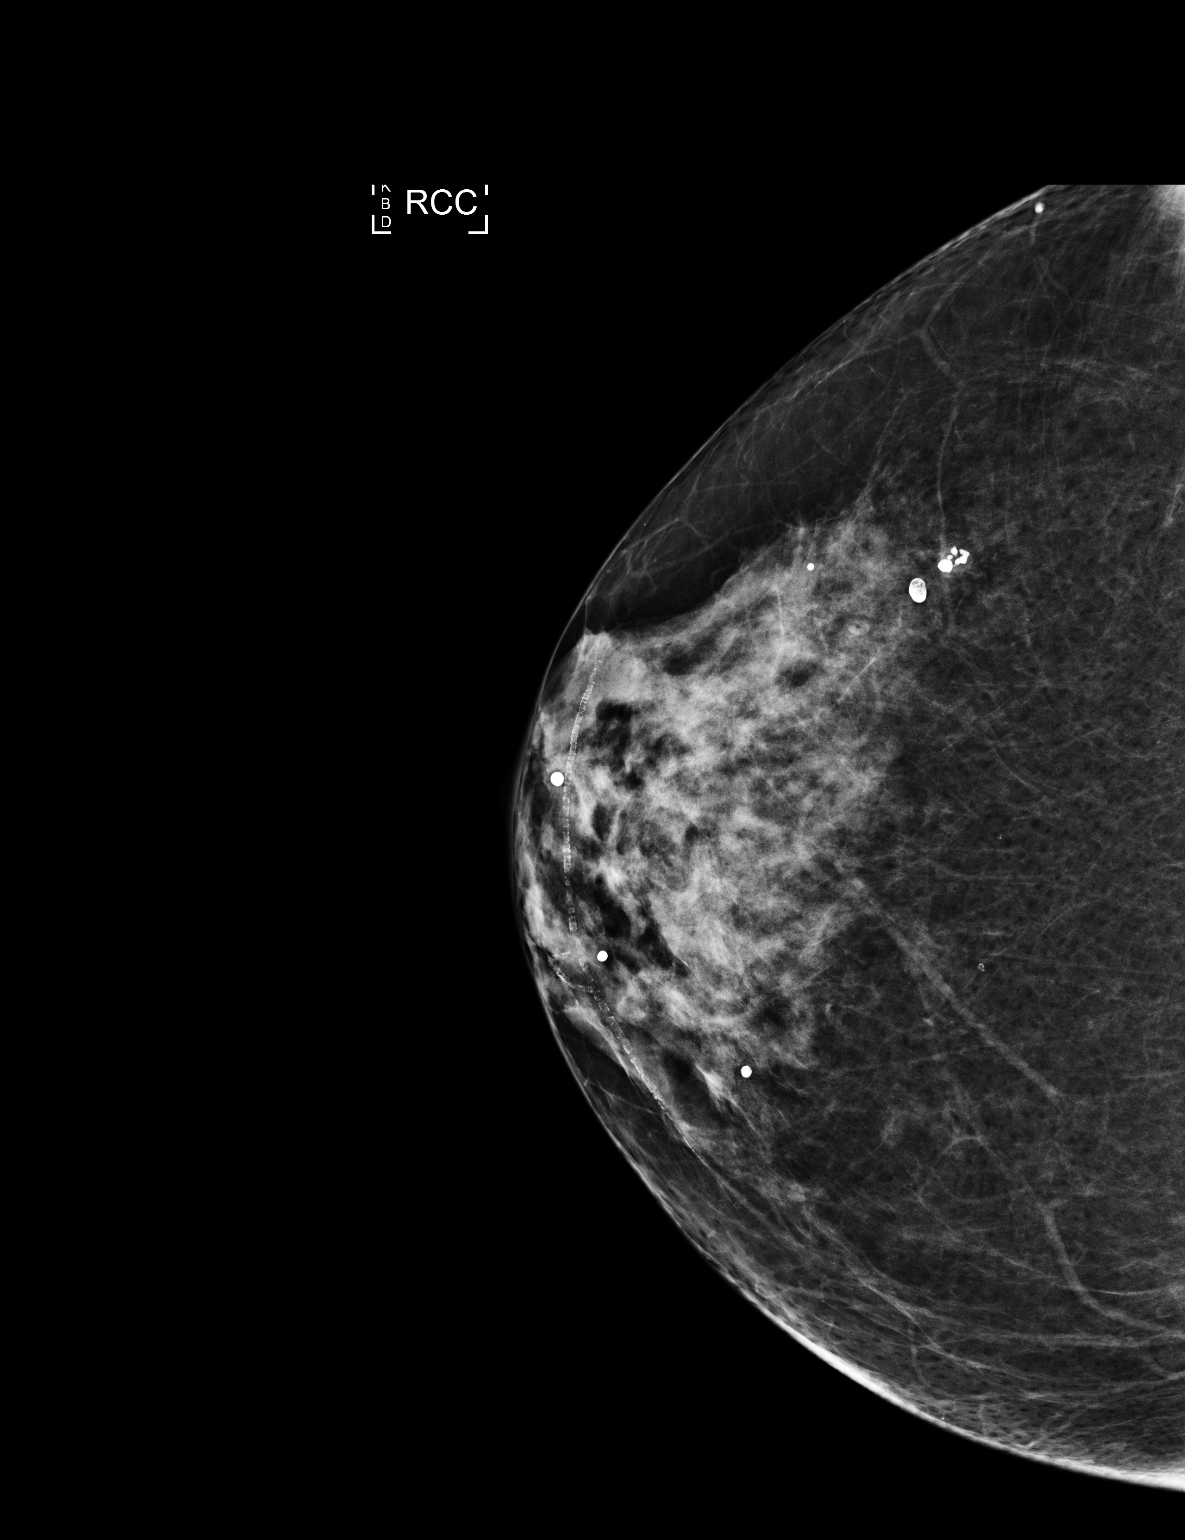

[3 of 3 positions shown; findings below may reference images not displayed]

ACR Breast Density Category c: The breast tissue is heterogeneously
dense, which may obscure small masses.
FINDINGS: The patient has had a left mastectomy. There are no findings
suspicious for malignancy.
IMPRESSION: No mammographic evidence of malignancy. A result letter of this
screening mammogram will be mailed directly to the patient.

RECOMMENDATION:
Screening mammogram in one year.  ([id number])

BI-RADS CATEGORY  1: Negative.

## 2016-04-18 ENCOUNTER — Other Ambulatory Visit: Payer: Self-pay | Admitting: Family Medicine

## 2016-04-18 DIAGNOSIS — Z1231 Encounter for screening mammogram for malignant neoplasm of breast: Secondary | ICD-10-CM

## 2016-05-10 ENCOUNTER — Encounter: Payer: Self-pay | Admitting: Family Medicine

## 2016-05-10 ENCOUNTER — Ambulatory Visit (INDEPENDENT_AMBULATORY_CARE_PROVIDER_SITE_OTHER): Payer: Medicare Other | Admitting: Family Medicine

## 2016-05-10 VITALS — BP 138/74 | HR 62 | Temp 98.3°F | Resp 14 | Ht 62.0 in | Wt 154.0 lb

## 2016-05-10 DIAGNOSIS — Z23 Encounter for immunization: Secondary | ICD-10-CM

## 2016-05-10 DIAGNOSIS — Z124 Encounter for screening for malignant neoplasm of cervix: Secondary | ICD-10-CM

## 2016-05-10 DIAGNOSIS — Z1211 Encounter for screening for malignant neoplasm of colon: Secondary | ICD-10-CM | POA: Diagnosis not present

## 2016-05-10 DIAGNOSIS — K219 Gastro-esophageal reflux disease without esophagitis: Secondary | ICD-10-CM | POA: Diagnosis not present

## 2016-05-10 DIAGNOSIS — Z0000 Encounter for general adult medical examination without abnormal findings: Secondary | ICD-10-CM | POA: Diagnosis not present

## 2016-05-10 MED ORDER — RANITIDINE HCL 150 MG PO TABS: 150.0000 mg | ORAL_TABLET | Freq: Two times a day (BID) | ORAL | 12 refills | Status: DC

## 2016-05-10 NOTE — Progress Notes (Signed)
Patient: Erin Middleton, Female    DOB: 03-21-1946, 70 y.o.   MRN: [id number] Visit Date: 05/10/2016  Today's Provider: Wilhemena Durie, MD   Chief Complaint  Patient presents with  . Medicare Wellness   Subjective:   [name] is a 70 y.o. female who presents today for her Subsequent Annual Wellness Visit. She feels fairly well. She reports exercising no. She reports she is sleeping well. 6 months the patient has had indigestion. About one after eating any food or beverage she has the symptoms. Immunization History  Administered Date(s) Administered  . Influenza-Unspecified 04/09/2015  . Pneumococcal Polysaccharide-23 03/06/2012  . Td 01/23/2003   Last colonoscopy 04/01/15 diverticulosis otherwise normal, repeat in 5 years  Pap smear 02/19/14 normal-never had abnormal pap smears, never had hysterectomy  Mammogram 05/2015  BMD 03/14/16 osteopenia  Review of Systems  Constitutional: Negative.   HENT: Negative.   Eyes: Negative.   Respiratory: Negative.   Cardiovascular: Negative.   Gastrointestinal: Negative.        Patient has had heartburn/indigestion after eating for about 6 months. No pain, no nausea or vomiting.  Endocrine: Negative.   Genitourinary: Negative.   Musculoskeletal: Positive for myalgias.  Skin: Negative.   Allergic/Immunologic: Negative.   Neurological: Negative.   Hematological: Negative.   Psychiatric/Behavioral: Negative.     Patient Active Problem List   Diagnosis Date Noted  . 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 Hypertension 01/18/2015  . Breast CA ([place]) 11/04/2014  . Acid reflux 11/04/2014  . Blood pressure elevated 11/04/2014  . Plantar fasciitis 11/04/2014  . H/O ear disorder 05/19/2014  . Breast cancer, female ([place]) 05/19/2014  . Dupuytren's contracture of foot 05/19/2014  . Avitaminosis D 09/21/2008  . 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 History of colon polyps 07/19/2006    Social History   Social History  . Marital status:  Married    Spouse name: N/A  . Number of children: N/A  . Years of education: N/A   Occupational History  . Not on file.   Social History Main Topics  . Smoking status: Never Smoker  . Smokeless tobacco: Never Used  . Alcohol use No  . Drug use: No  . Sexual activity: No   Other Topics Concern  . Not on file   Social History Narrative  . No narrative on file    Past Surgical History:  Procedure Laterality Date  . COLONOSCOPY WITH PROPOFOL N/A 04/01/2015   Procedure: COLONOSCOPY WITH PROPOFOL;  Surgeon: Hulen Luster, MD;  Location: [facility] ENDOSCOPY;  Service: Gastroenterology;  Laterality: N/A;  . DILATION AND CURETTAGE OF UTERUS    . MASTECTOMY Left   . NECK SURGERY     neck deformity-straighten out neck as a child  . PALATE / UVULA BIOPSY / EXCISION    . TOTAL KNEE ARTHROPLASTY Left 09/22/2015   Procedure: TOTAL KNEE ARTHROPLASTY;  Surgeon: Earnestine Leys, MD;  Location: [facility] ORS;  Service: Orthopedics;  Laterality: Left;    Her family history includes Dementia in her mother; Diabetes in her brother, brother, and mother; Heart disease in her father; Lung cancer in her father; Stroke in her brother and mother; Throat cancer in her brother.     Outpatient Encounter Prescriptions as of 05/10/2016  Medication Sig Note  . aspirin EC 325 MG tablet Take 1 tablet (325 mg total) by mouth 2 (two) times daily.   Marland Kitchen gabapentin (NEURONTIN) 400 MG capsule Take 1 capsule (400 mg total) by mouth 3 (three) times  daily.   . losartan-hydrochlorothiazide (HYZAAR) 50-12.5 MG tablet Take 2 tablets by mouth daily.   . Multiple Vitamins-Minerals (PRESERVISION/LUTEIN) CAPS Take by mouth. Reported on 09/15/2015 11/04/2014: Received from: [facility]  . vitamin E 400 UNIT capsule Take 400 Units by mouth daily. Reported on 09/15/2015 11/04/2014: Received from: [facility]  . omeprazole (PRILOSEC) 20 MG capsule TAKE ONE CAPSULE BY MOUTH DAILY (Patient not taking: Reported on  05/10/2016)   . [DISCONTINUED] calcium gluconate 650 MG tablet Take 650 mg by mouth daily. Reported on 09/15/2015   . [DISCONTINUED] cholecalciferol (VITAMIN D) 1000 UNITS tablet Take 1,000 Units by mouth daily. Reported on 09/15/2015 11/04/2014: Received from: [facility]  . [DISCONTINUED] HYDROcodone-acetaminophen (NORCO) 7.5-325 MG tablet Take 1 tablet by mouth every 6 (six) hours as needed for moderate pain.   . [DISCONTINUED] KLOR-CON M10 10 MEQ tablet TAKE 1 TABLET (10 MEQ TOTAL) BY MOUTH 2 (TWO) TIMES DAILY.   . [DISCONTINUED] magnesium oxide (MAG-OX) 400 MG tablet Take 400 mg by mouth 2 (two) times daily.   . [DISCONTINUED] meloxicam (MOBIC) 15 MG tablet Take 15 mg by mouth daily. Reported on 11/11/2015 12/17/2014: Received from: External Pharmacy Received Sig: TAKE 1 TABLET(S) EVERY DAY BY MOUTH WITH MEALS.  . [DISCONTINUED] meloxicam (MOBIC) 15 MG tablet Take 1 tablet (15 mg total) by mouth daily. (Patient not taking: Reported on 11/11/2015)    No facility-administered encounter medications on file as of 05/10/2016.     No Known Allergies  Patient Care Team: Jerrol Banana., MD as PCP - General (Family Medicine)   Objective:   Vitals:  Vitals:   05/10/16 0958  BP: 138/74  Pulse: 62  Resp: 14  Temp: 98.3 F (36.8 C)  Weight: 154 lb (69.9 kg)  Height: 5\' 2"  (1.575 m)    Physical Exam  Constitutional: She is oriented to person, place, and time. She appears well-developed and well-nourished.  HENT:  Head: Normocephalic and atraumatic.  Right Ear: External ear normal.  Left Ear: External ear normal.  Mouth/Throat: Oropharynx is clear and moist.  Eyes: Conjunctivae are normal. Pupils are equal, round, and reactive to light.  Neck: Normal range of motion. Neck supple.  Cardiovascular: Normal rate, regular rhythm, normal heart sounds and intact distal pulses.   No murmur heard. Pulmonary/Chest: Effort normal and breath sounds normal. She has no wheezes.   Left mastectomy scar without any mass.  Abdominal: Soft. She exhibits no distension. There is no tenderness.  Genitourinary: Vagina normal and uterus normal. Rectal exam shows guaiac negative stool. No vaginal discharge found.  Musculoskeletal: She exhibits no edema or tenderness.  Mild to moderate increased thoracic kyphosis  Neurological: She is alert and oriented to person, place, and time.  Skin: Skin is warm and dry. No rash noted. No erythema.  Psychiatric: She has a normal mood and affect. Her behavior is normal. Judgment and thought content normal.    Activities of Daily Living In your present state of health, do you have any difficulty performing the following activities: 05/10/2016 09/22/2015  Hearing? N -  Vision? N -  Difficulty concentrating or making decisions? N -  Walking or climbing stairs? Y -  Dressing or bathing? N -  Doing errands, shopping? N N  Some recent data might be hidden    Fall Risk Assessment Fall Risk  05/10/2016 02/23/2015  Falls in the past year? No Yes  Number falls in past yr: - 1  Injury with Fall? - No  Depression Screen PHQ 2/9 Scores 05/10/2016 02/23/2015  PHQ - 2 Score 1 0    Cognitive Testing - 6-CIT    Year: 0 4 points  Month: 0 3 points  Memorize "Pia Mau, [address], [place]"  Time (within 1 hour:) 0 3 points  Count backwards from 20: 0 2 4 points  Name months of year: 0 2 4 points  Repeat Address: 0 2 4 6 8 10  points   Total Score: 4/28  Interpretation : Normal (0-7) Abnormal (8-28)    Assessment & Plan:     Annual Wellness Visit  Reviewed patient's Family Medical History Reviewed and updated list of patient's medical providers Assessment of cognitive impairment was done Assessed patient's functional ability Established a written schedule for health screening [place] Completed and Reviewed  1. Medicare annual wellness visit, subsequent  2. Hypercalcemia Re check labs on the next  visit. Continue staying off  calcium supplements.  3. Colon cancer screening - IFOBT POC (occult bld, rslt in office)  4. Pap smear for cervical cancer screening - Pap IG (Image Guided)  5. Gastroesophageal reflux disease, esophagitis presence not specified Try Zantac and follow up in 1 to 2 months  6. Needs flu shot - Flu vaccine HIGH DOSE PF (Fluzone High dose)  7. Need for pneumococcal vaccination  HPI, Exam and A&P transcribed under direction and in the presence of Miguel Aschoff, MD.  I have done the exam and reviewed the chart and it is accurate to the best of my knowledge. Development worker, community has been used and  any errors in dictation or transcription are unintentional. Miguel Aschoff M.D. [facility]

## 2016-05-14 LAB — PAP IG (IMAGE GUIDED): PAP Smear Comment: 0

## 2016-05-15 ENCOUNTER — Telehealth: Payer: Self-pay

## 2016-05-15 NOTE — Telephone Encounter (Signed)
-----   Message from Jerrol Banana., MD sent at 05/15/2016  8:20 AM EST ----- Normal Pap

## 2016-05-15 NOTE — Telephone Encounter (Signed)
Patient has been advised. KW 

## 2016-05-18 ENCOUNTER — Ambulatory Visit
Admission: RE | Admit: 2016-05-18 | Discharge: 2016-05-18 | Disposition: A | Discharge status: Home / Self Care | Payer: Medicare Other | Source: Ambulatory Visit | Attending: Family Medicine | Admitting: Family Medicine

## 2016-05-18 DIAGNOSIS — Z1231 Encounter for screening mammogram for malignant neoplasm of breast: Secondary | ICD-10-CM | POA: Insufficient documentation

## 2016-05-18 LAB — IFOBT (OCCULT BLOOD): IMMUNOLOGICAL FECAL OCCULT BLOOD TEST: NEGATIVE

## 2016-07-11 ENCOUNTER — Encounter: Payer: Self-pay | Admitting: Family Medicine

## 2016-07-11 ENCOUNTER — Ambulatory Visit (INDEPENDENT_AMBULATORY_CARE_PROVIDER_SITE_OTHER): Payer: Medicare Other | Admitting: Family Medicine

## 2016-07-11 DIAGNOSIS — I10 Essential (primary) hypertension: Secondary | ICD-10-CM | POA: Diagnosis not present

## 2016-07-11 DIAGNOSIS — K219 Gastro-esophageal reflux disease without esophagitis: Secondary | ICD-10-CM

## 2016-07-11 DIAGNOSIS — Z789 Other specified health status: Secondary | ICD-10-CM

## 2016-07-11 MED ORDER — LOSARTAN POTASSIUM-HCTZ 100-12.5 MG PO TABS: 1.0000 | ORAL_TABLET | Freq: Every day | ORAL | 3 refills | Status: DC

## 2016-07-11 NOTE — Progress Notes (Signed)
Subjective:  HPI Pt is here for a 2 month follow up for:  GERD- last OV was 05/10/16 and Zantac was started. She reports that it helps but some things she eats still makes it flare. She reports that most of the time it is tolerable.   Hypercalcemia- To recheck labs at this office visit. They were checked on 02/10/16 and were good, but last note says to recheck them as next OV.   Pt reports that she has a headache today, that feels like pressure behind her eyes. " It hurts like it would if I were taking on a cold". Denies sinus symptoms.    Hypertension, follow-up:   Pt reports that she has been out of her BP medication for 2 days.   BP Readings from Last 3 Encounters:  07/11/16 (!) 146/78  05/10/16 138/74  02/10/16 128/78    She was last seen for hypertension 2 months ago.  BP at that visit was 138/74. Management since that visit includes none. She reports fair compliance with treatment. She is not having side effects.  She is not exercising. She is adherent to low salt diet.   Outside blood pressures are not being checked. She is experiencing none.  Patient denies chest pain, chest pressure/discomfort, claudication, dyspnea, exertional chest pressure/discomfort, fatigue, irregular heart beat, lower extremity edema, near-syncope, orthopnea, palpitations, paroxysmal nocturnal dyspnea and syncope.   Wt Readings from Last 3 Encounters:  07/11/16 155 lb (70.3 kg)  05/10/16 154 lb (69.9 kg)  02/10/16 154 lb (69.9 kg)   ------------------------------------------------------------------------    Prior to Admission medications   Medication Sig Start Date End Date Taking? Authorizing Provider  aspirin EC 325 MG tablet Take 1 tablet (325 mg total) by mouth 2 (two) times daily. 09/25/15  Yes Earnestine Leys, MD  ibuprofen (ADVIL,MOTRIN) 200 MG tablet Take 400 mg by mouth 3 (three) times daily.   Yes Historical Provider, MD  losartan-hydrochlorothiazide (HYZAAR) 50-12.5 MG tablet  Take 2 tablets by mouth daily. 08/30/15  Yes Richard Maceo Pro., MD  Multiple Vitamins-Minerals (PRESERVISION/LUTEIN) CAPS Take by mouth. Reported on 09/15/2015   Yes Historical Provider, MD  ranitidine (ZANTAC) 150 MG tablet Take 1 tablet (150 mg total) by mouth 2 (two) times daily. 05/10/16  Yes Richard Maceo Pro., MD  vitamin E 400 UNIT capsule Take 400 Units by mouth daily. Reported on 09/15/2015   Yes Historical Provider, MD    Patient Active Problem List   Diagnosis Date Noted  . 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 Hypertension 01/18/2015  . Breast CA ([place]) 11/04/2014  . Acid reflux 11/04/2014  . Blood pressure elevated 11/04/2014  . Plantar fasciitis 11/04/2014  . H/O ear disorder 05/19/2014  . Breast cancer, female ([place]) 05/19/2014  . Dupuytren's contracture of foot 05/19/2014  . Avitaminosis D 09/21/2008  . 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 History of colon polyps 07/19/2006    Past Medical History:  Diagnosis Date  . Arthritis   . Breast cancer ([facility]) 1985   left breast cancer - chemotherapy  . Cancer ([facility]CC)    Breast  . GERD (gastroesophageal reflux disease)   . Headache    migraines h/o  . Hypertension     Social History   Social History  . Marital status: Married    Spouse name: N/A  . Number of children: N/A  . Years of education: N/A   Occupational History  . Not on file.   Social History Main Topics  . Smoking status:  Never Smoker  . Smokeless tobacco: Never Used  . Alcohol use No  . Drug use: No  . Sexual activity: No   Other Topics Concern  . Not on file   Social History Narrative  . No narrative on file    No Known Allergies  Review of Systems  Constitutional: Negative.   HENT: Negative.   Eyes: Negative.   Respiratory: Negative.   Cardiovascular: Negative.   Gastrointestinal: Negative.   Genitourinary: Negative.   Musculoskeletal: Negative.   Skin: Negative.   Neurological: Positive for headaches.  Endo/Heme/Allergies:  Negative.   Psychiatric/Behavioral: Negative.     Immunization History  Administered Date(s) Administered  . Influenza, High Dose Seasonal PF 05/10/2016  . Influenza-Unspecified 04/09/2015  . Pneumococcal Conjugate-13 05/10/2016  . Pneumococcal Polysaccharide-23 03/06/2012  . Td 01/23/2003    Objective:  BP (!) 146/78 (BP Location: Right Arm, Patient Position: Sitting, Cuff Size: Normal)   Pulse 68   Temp 97.7 F (36.5 C) (Oral)   Resp 16   Wt 155 lb (70.3 kg)   BMI 28.35 kg/m   Physical Exam  Constitutional: She is oriented to person, place, and time and well-developed, well-nourished, and in no distress.  HENT:  Head: Normocephalic and atraumatic.  Eyes: Conjunctivae and EOM are normal. Pupils are equal, round, and reactive to light.  Neck: Normal range of motion. Neck supple.  Cardiovascular: Normal rate, regular rhythm, normal heart sounds and intact distal pulses.   Pulmonary/Chest: Effort normal and breath sounds normal.  Abdominal: Soft.  Musculoskeletal: Normal range of motion.  Mild increase in thoracic kyphosis.  Neurological: She is alert and oriented to person, place, and time. She has normal reflexes. Gait normal. GCS score is 15.  Skin: Skin is warm and dry.  Psychiatric: Mood, memory, affect and judgment normal.    Lab Results  Component Value Date   WBC 6.7 09/25/2015   HGB 9.9 (L) 09/25/2015   HCT 28.2 (L) 09/25/2015   PLT 155 09/25/2015   GLUCOSE 98 11/11/2015   CHOL 174 03/12/2012   TRIG 60 03/12/2012   HDL 62 03/12/2012   LDLCALC 99 03/12/2012   TSH 2.310 01/18/2015   INR 1.10 09/08/2015   HGBA1C 5.7 09/08/2015    CMP     Component Value Date/Time   NA 140 11/11/2015 1151   K 4.4 11/11/2015 1151   CL 97 11/11/2015 1151   CO2 26 11/11/2015 1151   GLUCOSE 98 11/11/2015 1151   GLUCOSE 124 (H) 09/23/2015 0435   BUN 21 11/11/2015 1151   CREATININE 0.91 11/11/2015 1151   CALCIUM 10.0 02/10/2016 1526   PROT 7.3 11/11/2015 1151   ALBUMIN  4.5 11/11/2015 1151   AST 23 11/11/2015 1151   ALT 19 11/11/2015 1151   ALKPHOS 81 11/11/2015 1151   BILITOT 0.5 11/11/2015 1151   GFRNONAA 63 11/11/2015 1151   GFRAA 73 11/11/2015 1151    Assessment and Plan :     1. Essential hypertension  - CBC with Differential/Platelet - TSH - losartan-hydrochlorothiazide (HYZAAR) 100-12.5 MG tablet; Take 1 tablet by mouth daily.  Dispense: 90 tablet; Refill: 3  2. Hypercalcemia  - Comprehensive metabolic panel - PTH, intact and calcium  3. Gastroesophageal reflux disease, esophagitis presence not specified  - Comprehensive metabolic panel 4. History of breast cancer  5. Long history of headaches  HPI, Exam, and A&P Transcribed under the direction and in the presence of Richard L. Cranford Mon, MD  Electronically Signed: Katina Dung, CMA I have  done the exam and reviewed the above chart and it is accurate to the best of my knowledge. Development worker, community has been used in this note in any air is in the dictation or transcription are unintentional.  [facility] 07/11/2016 11:20 AM

## 2016-07-12 LAB — CBC WITH DIFFERENTIAL/PLATELET
BASOS: 0 %
Basophils Absolute: 0 10*3/uL (ref 0.0–0.2)
EOS (ABSOLUTE): 0.3 10*3/uL (ref 0.0–0.4)
Eos: 4 %
HEMATOCRIT: 41.4 % (ref 34.0–46.6)
Hemoglobin: 13.3 g/dL (ref 11.1–15.9)
IMMATURE GRANS (ABS): 0 10*3/uL (ref 0.0–0.1)
Immature Granulocytes: 0 %
LYMPHS: 23 %
Lymphocytes Absolute: 1.8 10*3/uL (ref 0.7–3.1)
MCH: 31.4 pg (ref 26.6–33.0)
MCHC: 32.1 g/dL (ref 31.5–35.7)
MCV: 98 fL — ABNORMAL HIGH (ref 79–97)
MONOS ABS: 0.5 10*3/uL (ref 0.1–0.9)
Monocytes: 6 %
NEUTROS ABS: 5.3 10*3/uL (ref 1.4–7.0)
Neutrophils: 67 %
PLATELETS: 243 10*3/uL (ref 150–379)
RBC: 4.24 x10E6/uL (ref 3.77–5.28)
RDW: 13.8 % (ref 12.3–15.4)
WBC: 7.9 10*3/uL (ref 3.4–10.8)

## 2016-07-12 LAB — PTH, INTACT AND CALCIUM: PTH: 22 pg/mL (ref 15–65)

## 2016-07-12 LAB — COMPREHENSIVE METABOLIC PANEL
ALT: 16 IU/L (ref 0–32)
AST: 21 IU/L (ref 0–40)
Albumin/Globulin Ratio: 1.6 (ref 1.2–2.2)
Albumin: 4.3 g/dL (ref 3.5–4.8)
Alkaline Phosphatase: 90 IU/L (ref 39–117)
BILIRUBIN TOTAL: 0.5 mg/dL (ref 0.0–1.2)
BUN / CREAT RATIO: 21 (ref 12–28)
BUN: 19 mg/dL (ref 8–27)
CHLORIDE: 100 mmol/L (ref 96–106)
CO2: 23 mmol/L (ref 18–29)
Calcium: 9.6 mg/dL (ref 8.7–10.3)
Creatinine, Ser: 0.92 mg/dL (ref 0.57–1.00)
GFR calc non Af Amer: 62 mL/min/{1.73_m2} (ref >59)
GFR, EST AFRICAN AMERICAN: 72 mL/min/{1.73_m2} (ref >59)
GLOBULIN, TOTAL: 2.7 g/dL (ref 1.5–4.5)
Glucose: 92 mg/dL (ref 65–99)
POTASSIUM: 4.4 mmol/L (ref 3.5–5.2)
SODIUM: 140 mmol/L (ref 134–144)
TOTAL PROTEIN: 7 g/dL (ref 6.0–8.5)

## 2016-07-12 LAB — TSH: TSH: 3.38 u[IU]/mL (ref 0.450–4.500)

## 2016-07-30 ENCOUNTER — Other Ambulatory Visit: Payer: Self-pay | Admitting: Family Medicine

## 2016-07-30 DIAGNOSIS — Z789 Other specified health status: Secondary | ICD-10-CM

## 2016-07-30 DIAGNOSIS — I10 Essential (primary) hypertension: Secondary | ICD-10-CM

## 2017-01-10 ENCOUNTER — Encounter: Payer: Self-pay | Admitting: Family Medicine

## 2017-01-10 ENCOUNTER — Ambulatory Visit (INDEPENDENT_AMBULATORY_CARE_PROVIDER_SITE_OTHER): Payer: Medicare Other | Admitting: Family Medicine

## 2017-01-10 VITALS — BP 142/82 | HR 60 | Temp 97.4°F | Resp 16 | Wt 160.0 lb

## 2017-01-10 DIAGNOSIS — I10 Essential (primary) hypertension: Secondary | ICD-10-CM | POA: Diagnosis not present

## 2017-01-10 MED ORDER — AMLODIPINE BESYLATE 5 MG PO TABS: 5.0000 mg | ORAL_TABLET | Freq: Every evening | ORAL | 3 refills | Status: DC

## 2017-01-10 NOTE — Progress Notes (Signed)
Subjective:  HPI  Hypertension, follow-up:  BP Readings from Last 3 Encounters:  01/10/17 (!) 142/82  07/11/16 (!) 146/78  05/10/16 138/74    She was last seen for hypertension 6 months ago.  BP at that visit was 138/74. Management since that visit includes none. She reports good compliance with treatment. She is not having side effects. She is not exercising. She is adherent to low salt diet.   Outside blood pressures are not being checked at home. She is experiencing none.  Patient denies chest pain, chest pressure/discomfort, claudication, dyspnea, exertional chest pressure/discomfort, fatigue, irregular heart beat, lower extremity edema, near-syncope, orthopnea, palpitations, paroxysmal nocturnal dyspnea and syncope.   Cardiovascular risk factors include advanced age (older than 51 for men, 60 for women) and hypertension.  Wt Readings from Last 3 Encounters:  01/10/17 160 lb (72.6 kg)  07/11/16 155 lb (70.3 kg)  05/10/16 154 lb (69.9 kg)   ------------------------------------------------------------------------  Pt reports that she only thing that is bothering her right now is her arthritis seems to be getting worse, otherwise feeling ok. She also is having burning in the bottom of her feet, this started about 3-4 months ago.   Prior to Admission medications   Medication Sig Start Date End Date Taking? Authorizing Provider  aspirin EC 325 MG tablet Take 1 tablet (325 mg total) by mouth 2 (two) times daily. 09/25/15   Earnestine Leys, MD  ibuprofen (ADVIL,MOTRIN) 200 MG tablet Take 400 mg by mouth 3 (three) times daily.    [provider]  losartan-hydrochlorothiazide (HYZAAR) 100-12.5 MG tablet Take 1 tablet by mouth daily. 07/11/16   Jerrol Banana., MD  losartan-hydrochlorothiazide (HYZAAR) 50-12.5 MG tablet TAKE 2 TABLETS BY MOUTH DAILY. 07/31/16   Jerrol Banana., MD  Multiple Vitamins-Minerals (PRESERVISION/LUTEIN) CAPS Take by mouth. Reported on  09/15/2015    [provider]  ranitidine (ZANTAC) 150 MG tablet Take 1 tablet (150 mg total) by mouth 2 (two) times daily. 05/10/16   Jerrol Banana., MD  vitamin E 400 UNIT capsule Take 400 Units by mouth daily. Reported on 09/15/2015    [provider]    Patient Active Problem List   Diagnosis Date Noted  . 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 Hypertension 01/18/2015  . Breast CA ([place]) 11/04/2014  . Acid reflux 11/04/2014  . Blood pressure elevated 11/04/2014  . Plantar fasciitis 11/04/2014  . H/O ear disorder 05/19/2014  . Breast cancer, female ([place]) 05/19/2014  . Dupuytren's contracture of foot 05/19/2014  . Avitaminosis D 09/21/2008  . 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 History of colon polyps 07/19/2006    Past Medical History:  Diagnosis Date  . Arthritis   . Breast cancer ([place]) 1985   left breast cancer - chemotherapy  . Cancer (HCC)    Breast  . GERD (gastroesophageal reflux disease)   . Headache    migraines h/o  . Hypertension     Social History   Social History  . Marital status: Married    Spouse name: N/A  . Number of children: N/A  . Years of education: N/A   Occupational History  . Not on file.   Social History Main Topics  . Smoking status: Never Smoker  . Smokeless tobacco: Never Used  . Alcohol use No  . Drug use: No  . Sexual activity: No   Other Topics Concern  . Not on file   Social History Narrative  . No narrative on file  No Known Allergies  Review of Systems  Constitutional: Negative.   HENT: Negative.   Eyes: Negative.   Respiratory: Negative.   Cardiovascular: Negative.   Gastrointestinal: Negative.   Genitourinary: Negative.   Musculoskeletal: Positive for back pain and joint pain.  Skin: Negative.   Neurological: Positive for tingling (in bottom of feet).  Endo/Heme/Allergies: Negative.   Psychiatric/Behavioral: Negative.     Immunization History  Administered Date(s) Administered  .  Influenza, High Dose Seasonal PF 05/10/2016  . Influenza-Unspecified 04/09/2015  . Pneumococcal Conjugate-13 05/10/2016  . Pneumococcal Polysaccharide-23 03/06/2012  . Td 01/23/2003    Objective:  BP (!) 142/82 (BP Location: Right Arm, Patient Position: Sitting, Cuff Size: Normal)   Pulse 60   Temp (!) 97.4 F (36.3 C) (Oral)   Resp 16   Wt 160 lb (72.6 kg)   BMI 29.26 kg/m   Physical Exam  Constitutional: She is oriented to person, place, and time and well-developed, well-nourished, and in no distress.  HENT:  Head: Normocephalic and atraumatic.  Right Ear: External ear normal.  Left Ear: External ear normal.  Eyes: Conjunctivae are normal. No scleral icterus.  Neck: No thyromegaly present.  Cardiovascular: Normal rate, regular rhythm and normal heart sounds.   Pulmonary/Chest: Effort normal and breath sounds normal.  Abdominal: Soft.  Lymphadenopathy:    She has no cervical adenopathy.  Neurological: She is alert and oriented to person, place, and time. Gait normal. GCS score is 15.  Skin: Skin is warm and dry.  Psychiatric: Mood, memory, affect and judgment normal.    Lab Results  Component Value Date   WBC 7.9 07/11/2016   HGB 13.3 07/11/2016   HCT 41.4 07/11/2016   PLT 243 07/11/2016   GLUCOSE 92 07/11/2016   CHOL 174 03/12/2012   TRIG 60 03/12/2012   HDL 62 03/12/2012   LDLCALC 99 03/12/2012   TSH 3.380 07/11/2016   INR 1.10 09/08/2015   HGBA1C 5.7 09/08/2015    CMP     Component Value Date/Time   NA 140 07/11/2016 1148   K 4.4 07/11/2016 1148   CL 100 07/11/2016 1148   CO2 23 07/11/2016 1148   GLUCOSE 92 07/11/2016 1148   GLUCOSE 124 (H) 09/23/2015 0435   BUN 19 07/11/2016 1148   CREATININE 0.92 07/11/2016 1148   CALCIUM 9.6 07/11/2016 1148   PROT 7.0 07/11/2016 1148   ALBUMIN 4.3 07/11/2016 1148   AST 21 07/11/2016 1148   ALT 16 07/11/2016 1148   ALKPHOS 90 07/11/2016 1148   BILITOT 0.5 07/11/2016 1148   GFRNONAA 62 07/11/2016 1148   GFRAA  72 07/11/2016 1148    Assessment and Plan :  1. Essential hypertension RTC 1-2 months. - amLODipine (NORVASC) 5 MG tablet; Take 1 tablet (5 mg total) by mouth every evening.  Dispense: 90 tablet; Refill: 3 2.h/o Breast Cancer  I have done the exam and reviewed the above chart and it is accurate to the best of my knowledge. Development worker, community has been used in this note in any air is in the dictation or transcription are unintentional.   [facility] 01/10/2017 11:37 AM

## 2017-02-15 ENCOUNTER — Ambulatory Visit: Payer: Self-pay | Admitting: Family Medicine

## 2017-02-28 ENCOUNTER — Ambulatory Visit (INDEPENDENT_AMBULATORY_CARE_PROVIDER_SITE_OTHER): Payer: Medicare Other | Admitting: Family Medicine

## 2017-02-28 VITALS — BP 122/64 | HR 64 | Temp 97.8°F | Resp 14 | Wt 161.2 lb

## 2017-02-28 DIAGNOSIS — Z6829 Body mass index (BMI) 29.0-29.9, adult: Secondary | ICD-10-CM | POA: Diagnosis not present

## 2017-02-28 DIAGNOSIS — R200 Anesthesia of skin: Secondary | ICD-10-CM | POA: Diagnosis not present

## 2017-02-28 DIAGNOSIS — I10 Essential (primary) hypertension: Secondary | ICD-10-CM | POA: Diagnosis not present

## 2017-02-28 DIAGNOSIS — Z8639 Personal history of other endocrine, nutritional and metabolic disease: Secondary | ICD-10-CM

## 2017-02-28 DIAGNOSIS — R202 Paresthesia of skin: Secondary | ICD-10-CM | POA: Diagnosis not present

## 2017-02-28 LAB — POCT GLYCOSYLATED HEMOGLOBIN (HGB A1C): Hemoglobin A1C: 5.9

## 2017-02-28 NOTE — Progress Notes (Signed)
Erin Middleton  MRN: [id number] DOB: April 07, 1946  Subjective:  HPI  Patient is here for follow up on B/P. Last office visit was on 01/10/17. HTN: started Amlodipine on last visit. Patient has not been checking her b/p. No side effect from this medication that she can tell. She is also taking Hyzaar 100-12.5 mg daily. BP Readings from Last 3 Encounters:  02/28/17 122/64  01/10/17 (!) 142/82  07/11/16 (!) 146/78   Wt Readings from Last 3 Encounters:  02/28/17 161 lb 3.2 oz (73.1 kg)  01/10/17 160 lb (72.6 kg)  07/11/16 155 lb (70.3 kg)   Last lab work was done on 07/11/16. Patient states she has been having issue with tingling and numbness in her feet off and on. Last A1C was done in 2017 and it was 5.7 and patient states she does not have sugar issues that she knows of.. Do not see a B12 level in the last at least 2 years. CBC in January was normal except MCV was 98.  Patient Active Problem List   Diagnosis Date Noted  . 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 Hypertension 01/18/2015  . Breast CA ([place]) 11/04/2014  . Acid reflux 11/04/2014  . Blood pressure elevated 11/04/2014  . Plantar fasciitis 11/04/2014  . H/O ear disorder 05/19/2014  . Breast cancer, female ([place]) 05/19/2014  . Dupuytren's contracture of foot 05/19/2014  . Avitaminosis D 09/21/2008  . 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 History of colon polyps 07/19/2006    Past Medical History:  Diagnosis Date  . Arthritis   . Breast cancer ([place]) 1985   left breast cancer - chemotherapy  . Cancer (HCC)    Breast  . GERD (gastroesophageal reflux disease)   . Headache    migraines h/o  . Hypertension     Social History   Social History  . Marital status: Married    Spouse name: N/A  . Number of children: N/A  . Years of education: N/A   Occupational History  . Not on file.   Social History Main Topics  . Smoking status: Never Smoker  . Smokeless tobacco: Never Used  . Alcohol use No  . Drug use: No  .  Sexual activity: No   Other Topics Concern  . Not on file   Social History Narrative  . No narrative on file    Outpatient Encounter Prescriptions as of 02/28/2017  Medication Sig Note  . amLODipine (NORVASC) 5 MG tablet Take 1 tablet (5 mg total) by mouth every evening.   Marland Kitchen aspirin EC 325 MG tablet Take 1 tablet (325 mg total) by mouth 2 (two) times daily.   . Cholecalciferol (VITAMIN D PO) Take by mouth. Pt unsure of dosage.   Marland Kitchen ibuprofen (ADVIL,MOTRIN) 200 MG tablet Take 400 mg by mouth 3 (three) times daily.   Marland Kitchen losartan-hydrochlorothiazide (HYZAAR) 100-12.5 MG tablet Take 1 tablet by mouth daily.   . Multiple Vitamins-Minerals (PRESERVISION/LUTEIN) CAPS Take by mouth. Reported on 09/15/2015 11/04/2014: Received from: [facility]  . ranitidine (ZANTAC) 150 MG tablet Take 1 tablet (150 mg total) by mouth 2 (two) times daily.   . vitamin E 400 UNIT capsule Take 400 Units by mouth daily. Reported on 09/15/2015 11/04/2014: Received from: [facility]  . [DISCONTINUED] losartan-hydrochlorothiazide (HYZAAR) 50-12.5 MG tablet TAKE 2 TABLETS BY MOUTH DAILY. (Patient not taking: Reported on 01/10/2017)    No facility-administered encounter medications on file as of 02/28/2017.     No Known Allergies  Review  of Systems  Constitutional: Negative.   Eyes: Negative.   Respiratory: Negative.   Cardiovascular: Negative.   Gastrointestinal: Negative.   Musculoskeletal: Positive for joint pain and myalgias.       Left knee swelling-chronic per patient post surgery.  Skin: Negative.   Neurological: Positive for tingling (and numbness in her feet).  Psychiatric/Behavioral: Negative.     Objective:  BP 122/64   Pulse 64   Temp 97.8 F (36.6 C)   Resp 14   Wt 161 lb 3.2 oz (73.1 kg)   BMI 29.48 kg/m   Physical Exam  Constitutional: She is oriented to person, place, and time and well-developed, well-nourished, and in no distress.  HENT:  Head: Normocephalic  and atraumatic.  Right Ear: External ear normal.  Left Ear: External ear normal.  Nose: Nose normal.  Mouth/Throat: Oropharynx is clear and moist.  Eyes: Conjunctivae are normal. No scleral icterus.  Neck: No thyromegaly present.  Cardiovascular: Normal rate, regular rhythm and normal heart sounds.   Pulmonary/Chest: Effort normal and breath sounds normal.  Abdominal: Soft.  Neurological: She is alert and oriented to person, place, and time. Gait normal. GCS score is 15.  Skin: Skin is warm and dry.  Psychiatric: Mood, memory, affect and judgment normal.    Assessment and Plan :   HTN Neuropathy Check A1c,TSH,B12.CBC A1c is 5.9.  I have done the exam and reviewed the chart and it is accurate to the best of my knowledge. Development worker, community has been used and  any errors in dictation or transcription are unintentional. Miguel Aschoff M.D. [facility]

## 2017-03-01 LAB — CBC WITH DIFFERENTIAL/PLATELET
BASOS: 0 %
Basophils Absolute: 0 10*3/uL (ref 0.0–0.2)
EOS (ABSOLUTE): 0.3 10*3/uL (ref 0.0–0.4)
Eos: 4 %
HEMOGLOBIN: 14.3 g/dL (ref 11.1–15.9)
Hematocrit: 43 % (ref 34.0–46.6)
IMMATURE GRANS (ABS): 0 10*3/uL (ref 0.0–0.1)
Immature Granulocytes: 0 %
LYMPHS: 30 %
Lymphocytes Absolute: 2.2 10*3/uL (ref 0.7–3.1)
MCH: 31.8 pg (ref 26.6–33.0)
MCHC: 33.3 g/dL (ref 31.5–35.7)
MCV: 96 fL (ref 79–97)
Monocytes Absolute: 0.4 10*3/uL (ref 0.1–0.9)
Monocytes: 6 %
NEUTROS ABS: 4.4 10*3/uL (ref 1.4–7.0)
Neutrophils: 60 %
Platelets: 250 10*3/uL (ref 150–379)
RBC: 4.49 x10E6/uL (ref 3.77–5.28)
RDW: 13.5 % (ref 12.3–15.4)
WBC: 7.3 10*3/uL (ref 3.4–10.8)

## 2017-03-01 LAB — TSH: TSH: 3.07 u[IU]/mL (ref 0.450–4.500)

## 2017-03-01 LAB — VITAMIN B12: Vitamin B-12: 596 pg/mL (ref 232–1245)

## 2017-03-14 ENCOUNTER — Encounter (INDEPENDENT_AMBULATORY_CARE_PROVIDER_SITE_OTHER): Payer: Medicare Other | Admitting: Ophthalmology

## 2017-03-14 DIAGNOSIS — I10 Essential (primary) hypertension: Secondary | ICD-10-CM

## 2017-03-14 DIAGNOSIS — H43813 Vitreous degeneration, bilateral: Secondary | ICD-10-CM | POA: Diagnosis not present

## 2017-03-14 DIAGNOSIS — H353132 Nonexudative age-related macular degeneration, bilateral, intermediate dry stage: Secondary | ICD-10-CM | POA: Diagnosis not present

## 2017-03-14 DIAGNOSIS — H35033 Hypertensive retinopathy, bilateral: Secondary | ICD-10-CM

## 2017-05-21 ENCOUNTER — Other Ambulatory Visit: Payer: Self-pay | Admitting: Family Medicine

## 2017-05-21 DIAGNOSIS — Z1231 Encounter for screening mammogram for malignant neoplasm of breast: Secondary | ICD-10-CM

## 2017-06-06 ENCOUNTER — Ambulatory Visit (INDEPENDENT_AMBULATORY_CARE_PROVIDER_SITE_OTHER): Payer: Medicare Other

## 2017-06-06 ENCOUNTER — Ambulatory Visit (INDEPENDENT_AMBULATORY_CARE_PROVIDER_SITE_OTHER): Payer: Medicare Other | Admitting: Family Medicine

## 2017-06-06 VITALS — BP 120/72 | HR 72 | Temp 98.4°F | Ht 62.0 in | Wt 159.0 lb

## 2017-06-06 VITALS — BP 120/72 | HR 72 | Temp 98.4°F | Ht 62.0 in | Wt 159.6 lb

## 2017-06-06 DIAGNOSIS — Z1159 Encounter for screening for other viral diseases: Secondary | ICD-10-CM | POA: Diagnosis not present

## 2017-06-06 DIAGNOSIS — K219 Gastro-esophageal reflux disease without esophagitis: Secondary | ICD-10-CM

## 2017-06-06 DIAGNOSIS — I10 Essential (primary) hypertension: Secondary | ICD-10-CM

## 2017-06-06 DIAGNOSIS — Z0000 Encounter for general adult medical examination without abnormal findings: Secondary | ICD-10-CM

## 2017-06-06 DIAGNOSIS — C50912 Malignant neoplasm of unspecified site of left female breast: Secondary | ICD-10-CM | POA: Diagnosis not present

## 2017-06-06 DIAGNOSIS — Z1211 Encounter for screening for malignant neoplasm of colon: Secondary | ICD-10-CM | POA: Diagnosis not present

## 2017-06-06 DIAGNOSIS — Z23 Encounter for immunization: Secondary | ICD-10-CM

## 2017-06-06 DIAGNOSIS — Z8639 Personal history of other endocrine, nutritional and metabolic disease: Secondary | ICD-10-CM | POA: Diagnosis not present

## 2017-06-06 MED ORDER — OMEPRAZOLE 20 MG PO CPDR: 20.0000 mg | DELAYED_RELEASE_CAPSULE | ORAL | 12 refills | Status: DC

## 2017-06-06 NOTE — Progress Notes (Signed)
Subjective:   Erin Middleton is a 71 y.o. female who presents for Medicare Annual (Subsequent) preventive examination.  Review of Systems:  N/A  Cardiac Risk Factors include: advanced age (>59men, >73 women);hypertension     Objective:     Vitals: BP 120/72 (BP Location: Left Arm)   Pulse 72   Temp 98.4 F (36.9 C) (Oral)   Ht 5\' 2"  (1.575 m)   Wt 159 lb 9.6 oz (72.4 kg)   BMI 29.19 kg/m   Body mass index is 29.19 kg/m.  Advanced Directives 06/06/2017 05/10/2016 09/22/2015 08/30/2015 02/23/2015 12/17/2014  Does Patient Have a Medical Advance Directive? Yes Yes Yes Yes Yes Yes  Type of Paramedic of [place];Living will - Living will;Healthcare Power of [place];Living will Living will [place];Living will  Does patient want to make changes to medical advance directive? - - No - Patient declined - - -  Copy of [place] in Chart? No - copy requested - No - copy requested - - -    Tobacco Social History   Tobacco Use  Smoking Status Never Smoker  Smokeless Tobacco Never Used     Counseling given: Not Answered   Clinical Intake:  Pre-visit preparation completed: Yes  Pain : No/denies pain Pain Score: 0-No pain     Nutritional Status: BMI 25 -29 Overweight Nutritional Risks: None Diabetes: No  Activities of Daily Living: Independent Ambulation: Independent with device- listed below Home Assistive Devices/Equipment: Eyeglasses Medication Administration: Independent Home Management: Independent  Barriers to Care Management & Learning: None  Do you feel unsafe in your current relationship?: No(married) Do you feel physically threatened by others?: No Anyone hurting you at home, work, or school?: No Unable to ask?: No Information provided on Community resources: No  How often do you need to have someone help you when you read instructions, pamphlets, or other written  materials from your doctor or pharmacy?: 1 - Never What is the last grade level you completed in school?: 3 years college  Interpreter Needed?: No  Information entered by :: [facility], LPN  Past Medical History:  Diagnosis Date  . Arthritis   . Breast cancer ([place]) 1985   left breast cancer - chemotherapy  . Cancer ([facility])    Breast  . GERD (gastroesophageal reflux disease)   . Headache    migraines h/o  . Hypertension    Past Surgical History:  Procedure Laterality Date  . COLONOSCOPY WITH PROPOFOL N/A 04/01/2015   Procedure: COLONOSCOPY WITH PROPOFOL;  Surgeon: Hulen Luster, MD;  Location: [facility] ENDOSCOPY;  Service: Gastroenterology;  Laterality: N/A;  . DILATION AND CURETTAGE OF UTERUS    . MASTECTOMY Left   . NECK SURGERY     neck deformity-straighten out neck as a child  . PALATE / UVULA BIOPSY / EXCISION    . 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 Diabetes Mother   . Stroke Mother   . Dementia Mother   . Lung cancer Father   . Heart disease Father   . Stroke Brother   . Diabetes Brother   . Throat cancer Brother   . Diabetes Brother    Social History   Socioeconomic History  . Marital status: Married    Spouse name: None  .  Number of children: None  . Years of education: None  . Highest education level: None  Social Needs  . Financial resource strain: None  . Food insecurity - worry: None  . Food insecurity - inability: None  . Transportation needs - medical: None  . Transportation needs - non-medical: None  Occupational History  . None  Tobacco Use  . Smoking status: Never Smoker  . Smokeless tobacco: Never Used  Substance and Sexual Activity  . Alcohol use: No    Alcohol/week: 0.0 oz  . Drug use: No  . Sexual activity: No  Other Topics Concern  . None  Social History  Narrative  . None    Outpatient Encounter Medications as of 06/06/2017  Medication Sig  . amLODipine (NORVASC) 5 MG tablet Take 1 tablet (5 mg total) by mouth every evening.  Marland Kitchen aspirin EC 325 MG tablet Take 1 tablet (325 mg total) by mouth 2 (two) times daily. (Patient taking differently: Take 325 mg by mouth once. )  . Cholecalciferol (VITAMIN D PO) Take by mouth 2 (two) times daily. Pt unsure of dosage.   Marland Kitchen ibuprofen (ADVIL,MOTRIN) 200 MG tablet Take 400 mg by mouth 3 (three) times daily.  Marland Kitchen losartan-hydrochlorothiazide (HYZAAR) 100-12.5 MG tablet Take 1 tablet by mouth daily.  . Multiple Vitamins-Minerals (PRESERVISION/LUTEIN) CAPS Take by mouth 2 (two) times daily. Reported on 09/15/2015  . ranitidine (ZANTAC) 150 MG tablet Take 1 tablet (150 mg total) by mouth 2 (two) times daily.  . vitamin E 400 UNIT capsule Take 400 Units by mouth daily. Reported on 09/15/2015   No facility-administered encounter medications on file as of 06/06/2017.     Activities of Daily Living In your present state of health, do you have any difficulty performing the following activities: 06/06/2017  Hearing? N  Vision? N  Difficulty concentrating or making decisions? N  Walking or climbing stairs? Y  Comment due to arthritis pain in both knees  Dressing or bathing? N  Doing errands, shopping? N  Preparing Food and eating ? N  Using the Toilet? N  In the past six months, have you accidently leaked urine? N  Do you have problems with loss of bowel control? N  Managing your Medications? N  Managing your Finances? N  Housekeeping or managing your Housekeeping? N  Some recent data might be hidden    Patient Care Team: Jerrol Banana., MD as PCP - General (Family Medicine) Agapito Games as Consulting Physician (Optometry) Earnestine Leys, MD as Consulting Physician (Specialist)    Assessment:     Exercise Activities and Dietary recommendations Current Exercise Habits: The patient does not  participate in regular exercise at present, Exercise limited by: None identified  Goals    . Exercise 3x per week (30 min per time)     Recommend to start walking at least 3 times a week for 30 minutes at a time.      Fall Risk Fall Risk  05/10/2016 02/23/2015  Falls in the past year? No Yes  Number falls in past yr: - 1  Injury with Fall? - No   Is the patient's home free of loose throw rugs in walkways, pet beds, electrical cords, etc?   yes      Grab bars in the bathroom? no      Handrails on the stairs?   yes      Adequate lighting?   yes  Depression Screen PHQ 2/9 Scores 06/06/2017 06/06/2017 05/10/2016 02/23/2015  PHQ -  2 Score 0 0 1 0  PHQ- 9 Score 2 2 - -     Cognitive Function: Pt declined screening today.     6CIT Screen 05/10/2016  What Year? 0 points  What month? 0 points  What time? 0 points  Count back from 20 0 points  Months in reverse 0 points  Repeat phrase 4 points  Total Score 4    Immunization History  Administered Date(s) Administered  . Influenza, High Dose Seasonal PF 05/10/2016, 06/06/2017  . Influenza-Unspecified 04/09/2015  . Pneumococcal Conjugate-13 05/10/2016  . Pneumococcal Polysaccharide-23 03/06/2012  . Td 01/23/2003   Screening Tests Health Maintenance  Topic Date Due  . Hepatitis C Screening  10/10/1945  . TETANUS/TDAP  01/22/2013  . MAMMOGRAM  05/18/2018  . COLONOSCOPY  03/31/2025  . INFLUENZA VACCINE  Completed  . DEXA SCAN  Completed  . PNA vac Low Risk Adult  Completed   Cancer Screenings: Lung: Low Dose CT Chest recommended if Age 30-80 years, 30 pack-year currently smoking OR have quit w/in 15years. Patient does not qualify. Breast: Up to date on Mammogram? Yes  Up to date of Bone Density/Dexa? Yes Colorectal: Yes  Additional Screenings:  Hepatitis B/HIV/Syphillis: Pt declined Hepatitis C Screening: Ordered lab work today     Plan:  I have personally reviewed and addressed the Medicare Annual Wellness questionnaire  and have noted the following in the patient's chart:  A. Medical and social history B. Use of alcohol, tobacco or illicit drugs  C. Current medications and supplements D. Functional ability and status E.  Nutritional status F.  Physical activity G. Advance directives H. List of other physicians I.  Hospitalizations, surgeries, and ER visits in previous 12 months J.  [place] such as hearing and vision if needed, cognitive and depression L. Referrals and appointments - none  In addition, I have reviewed and discussed with patient certain preventive protocols, quality metrics, and best practice recommendations. A written personalized care plan for preventive services as well as general preventive health recommendations were provided to patient.  See attached scanned questionnaire for additional information.   Signed,  Fabio Neighbors, LPN Nurse Health Advisor   Nurse Recommendations: None. Pt declined the tetanus vaccine today.

## 2017-06-06 NOTE — Patient Instructions (Signed)
Erin Middleton , Thank you for taking time to come for your Medicare Wellness Visit. I appreciate your ongoing commitment to your health goals. Please review the following plan we discussed and let me know if I can assist you in the future.   Screening recommendations/referrals: Colonoscopy: up to date Mammogram: up to date Bone Density: up to date Recommended yearly ophthalmology/optometry visit for glaucoma screening and checkup Recommended yearly dental visit for hygiene and checkup  Vaccinations: Influenza vaccine: completed today Pneumococcal vaccine: completed series Tdap vaccine: declined Shingles vaccine: declined  Advanced directives: Please bring a copy of your POA (Power of Attorney) and/or Living Will to your next appointment.   Conditions/risks identified: Recommend to start walking at least 3 times a week for 30 minutes at a time.   Next appointment: 10:30 AM today   Preventive Care 65 Years and Older, Female Preventive care refers to lifestyle choices and visits with your health care provider that can promote health and wellness. What does preventive care include?  A yearly physical exam. This is also called an annual well check.  Dental exams once or twice a year.  Routine eye exams. Ask your health care provider how often you should have your eyes checked.  Personal lifestyle choices, including:  Daily care of your teeth and gums.  Regular physical activity.  Eating a healthy diet.  Avoiding tobacco and drug use.  Limiting alcohol use.  Practicing safe sex.  Taking low-dose aspirin every day.  Taking vitamin and mineral supplements as recommended by your health care provider. What happens during an annual well check? The services and screenings done by your health care provider during your annual well check will depend on your age, overall health, lifestyle risk factors, and family history of disease. Counseling  Your health care provider may ask you  questions about your:  Alcohol use.  Tobacco use.  Drug use.  Emotional well-being.  Home and relationship well-being.  Sexual activity.  Eating habits.  History of falls.  Memory and ability to understand (cognition).  Work and work Statistician.  Reproductive health. Screening  You may have the following tests or measurements:  Height, weight, and BMI.  Blood pressure.  Lipid and cholesterol levels. These may be checked every 5 years, or more frequently if you are over 23 years old.  Skin check.  Lung cancer screening. You may have this screening every year starting at age 35 if you have a 30-pack-year history of smoking and currently smoke or have quit within the past 15 years.  Fecal occult blood test (FOBT) of the stool. You may have this test every year starting at age 69.  Flexible sigmoidoscopy or colonoscopy. You may have a sigmoidoscopy every 5 years or a colonoscopy every 10 years starting at age 7.  Hepatitis C blood test.  Hepatitis B blood test.  Sexually transmitted disease (STD) testing.  Diabetes screening. This is done by checking your blood sugar (glucose) after you have not eaten for a while (fasting). You may have this done every 1-3 years.  Bone density scan. This is done to screen for osteoporosis. You may have this done starting at age 82.  Mammogram. This may be done every 1-2 years. Talk to your health care provider about how often you should have regular mammograms. Talk with your health care provider about your test results, treatment options, and if necessary, the need for more tests. Vaccines  Your health care provider may recommend certain vaccines, such as:  Influenza  vaccine. This is recommended every year.  Tetanus, diphtheria, and acellular pertussis (Tdap, Td) vaccine. You may need a Td booster every 10 years.  Zoster vaccine. You may need this after age 64.  Pneumococcal 13-valent conjugate (PCV13) vaccine. One dose is  recommended after age 80.  Pneumococcal polysaccharide (PPSV23) vaccine. One dose is recommended after age 63. Talk to your health care provider about which screenings and vaccines you need and how often you need them. This information is not intended to replace advice given to you by your health care provider. Make sure you discuss any questions you have with your health care provider. Document Released: 07/16/2015 Document Revised: 03/08/2016 Document Reviewed: 04/20/2015 Elsevier Interactive Patient Education  2017 [place] Prevention in the Home Falls can cause injuries. They can happen to people of all ages. There are many things you can do to make your home safe and to help prevent falls. What can I do on the outside of my home?  Regularly fix the edges of walkways and driveways and fix any cracks.  Remove anything that might make you trip as you walk through a door, such as a raised step or threshold.  Trim any bushes or trees on the path to your home.  Use bright outdoor lighting.  Clear any walking paths of anything that might make someone trip, such as rocks or tools.  Regularly check to see if handrails are loose or broken. Make sure that both sides of any steps have handrails.  Any raised decks and porches should have guardrails on the edges.  Have any leaves, snow, or ice cleared regularly.  Use sand or salt on walking paths during winter.  Clean up any spills in your garage right away. This includes oil or grease spills. What can I do in the bathroom?  Use night lights.  Install grab bars by the toilet and in the tub and shower. Do not use towel bars as grab bars.  Use non-skid mats or decals in the tub or shower.  If you need to sit down in the shower, use a plastic, non-slip stool.  Keep the floor dry. Clean up any water that spills on the floor as soon as it happens.  Remove soap buildup in the tub or shower regularly.  Attach bath mats  securely with double-sided non-slip rug tape.  Do not have throw rugs and other things on the floor that can make you trip. What can I do in the bedroom?  Use night lights.  Make sure that you have a light by your bed that is easy to reach.  Do not use any sheets or blankets that are too big for your bed. They should not hang down onto the floor.  Have a firm chair that has side arms. You can use this for support while you get dressed.  Do not have throw rugs and other things on the floor that can make you trip. What can I do in the kitchen?  Clean up any spills right away.  Avoid walking on wet floors.  Keep items that you use a lot in easy-to-reach places.  If you need to reach something above you, use a strong step stool that has a grab bar.  Keep electrical cords out of the way.  Do not use floor polish or wax that makes floors slippery. If you must use wax, use non-skid floor wax.  Do not have throw rugs and other things on the floor that can  make you trip. What can I do with my stairs?  Do not leave any items on the stairs.  Make sure that there are handrails on both sides of the stairs and use them. Fix handrails that are broken or loose. Make sure that handrails are as long as the stairways.  Check any carpeting to make sure that it is firmly attached to the stairs. Fix any carpet that is loose or worn.  Avoid having throw rugs at the top or bottom of the stairs. If you do have throw rugs, attach them to the floor with carpet tape.  Make sure that you have a light switch at the top of the stairs and the bottom of the stairs. If you do not have them, ask someone to add them for you. What else can I do to help prevent falls?  Wear shoes that:  Do not have high heels.  Have rubber bottoms.  Are comfortable and fit you well.  Are closed at the toe. Do not wear sandals.  If you use a stepladder:  Make sure that it is fully opened. Do not climb a closed  stepladder.  Make sure that both sides of the stepladder are locked into place.  Ask someone to hold it for you, if possible.  Clearly mark and make sure that you can see:  Any grab bars or handrails.  First and last steps.  Where the edge of each step is.  Use tools that help you move around (mobility aids) if they are needed. These include:  Canes.  Walkers.  Scooters.  Crutches.  Turn on the lights when you go into a dark area. Replace any light bulbs as soon as they burn out.  Set up your furniture so you have a clear path. Avoid moving your furniture around.  If any of your floors are uneven, fix them.  If there are any pets around you, be aware of where they are.  Review your medicines with your doctor. Some medicines can make you feel dizzy. This can increase your chance of falling. Ask your doctor what other things that you can do to help prevent falls. This information is not intended to replace advice given to you by your health care provider. Make sure you discuss any questions you have with your health care provider. Document Released: 04/15/2009 Document Revised: 11/25/2015 Document Reviewed: 07/24/2014 Elsevier Interactive Patient Education  2017 [employer].

## 2017-06-06 NOTE — Progress Notes (Signed)
Patient: Erin Middleton, Female    DOB: 1946-07-03, 71 y.o.   MRN: [id number] Visit Date: 06/06/2017  Today's Provider: Wilhemena Durie, MD   Chief Complaint  Patient presents with  . Annual Exam   Subjective:  Erin Middleton is a 71 y.o. female who presents today for health maintenance and complete physical. She feels fairly well. She reports exercising not at this time. She reports she is sleeping fairly well. Patient saw [facility] for Wellness visit earlier today. Last Colonoscopy was 04/01/15 Dr Candace Cruise, diverticulosis, otherwise normal, repeat in 2021. Mammogram 05/18/16 negative. Pt scheduled for another one on 06/14/17 Pap smear 05/10/16 normal BMD 03/14/16 osteopenia  Fall Risk  05/10/2016 02/23/2015  Falls in the past year? No Yes  Number falls in past yr: - 1  Injury with Fall? - No   6CIT Screen 05/10/2016  What Year? 0 points  What month? 0 points  What time? 0 points  Count back from 20 0 points  Months in reverse 0 points  Repeat phrase 4 points  Total Score 4    Review of Systems  Constitutional: Positive for fatigue.  HENT: Negative.   Eyes: Positive for discharge.  Respiratory: Negative.   Cardiovascular: Positive for leg swelling.  Gastrointestinal: Negative.   Endocrine: Negative.   Genitourinary: Negative.   Musculoskeletal: Negative.   Skin: Negative.   Allergic/Immunologic: Negative.   Neurological: Negative.   Hematological: Negative.   Psychiatric/Behavioral: Negative.     Social History   Socioeconomic History  . Marital status: Married    Spouse name: Not on file  . Number of children: Not on file  . Years of education: Not on file  . Highest education level: Not on file  Social Needs  . Financial resource strain: Not on file  . Food insecurity - worry: Not on file  . Food insecurity - inability: Not on file  . Transportation needs - medical: Not on file  . Transportation needs - non-medical: Not on file  Occupational History  . Not on  file  Tobacco Use  . Smoking status: Never Smoker  . Smokeless tobacco: Never Used  Substance and Sexual Activity  . Alcohol use: No    Alcohol/week: 0.0 oz  . Drug use: No  . Sexual activity: No  Other Topics Concern  . Not on file  Social History Narrative  . Not on file    Patient Active Problem List   Diagnosis Date Noted  . 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 Hypertension 01/18/2015  . Breast CA ([place]) 11/04/2014  . Acid reflux 11/04/2014  . Blood pressure elevated 11/04/2014  . Plantar fasciitis 11/04/2014  . H/O ear disorder 05/19/2014  . Breast cancer, female ([place]) 05/19/2014  . Dupuytren's contracture of foot 05/19/2014  . Avitaminosis D 09/21/2008  . 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 History of colon polyps 07/19/2006    Past Surgical History:  Procedure Laterality Date  . COLONOSCOPY WITH PROPOFOL N/A 04/01/2015   Procedure: COLONOSCOPY WITH PROPOFOL;  Surgeon: Hulen Luster, MD;  Location: [facility] ENDOSCOPY;  Service: Gastroenterology;  Laterality: N/A;  . DILATION AND CURETTAGE OF UTERUS    . MASTECTOMY Left   . NECK SURGERY     neck deformity-straighten out neck as a child  . PALATE / UVULA BIOPSY / EXCISION    . TOTAL KNEE ARTHROPLASTY Left 09/22/2015   Procedure: TOTAL KNEE ARTHROPLASTY;  Surgeon: Earnestine Leys, MD;  Location: [facility] ORS;  Service: Orthopedics;  Laterality: Left;  Her family history includes Dementia in her mother; Diabetes in her brother, brother, and mother; Heart disease in her father; Lung cancer in her father; Stroke in her brother and mother; Throat cancer in her brother.     Outpatient Encounter Medications as of 06/06/2017  Medication Sig Note  . amLODipine (NORVASC) 5 MG tablet Take 1 tablet (5 mg total) by mouth every evening.   Marland Kitchen aspirin EC 325 MG tablet Take 1 tablet (325 mg total) by mouth 2 (two) times daily. (Patient taking differently: Take 325 mg by mouth once. )   . Cholecalciferol (VITAMIN D PO) Take by mouth 2 (two) times  daily. Pt unsure of dosage.    Marland Kitchen ibuprofen (ADVIL,MOTRIN) 200 MG tablet Take 400 mg by mouth 3 (three) times daily.   Marland Kitchen losartan-hydrochlorothiazide (HYZAAR) 100-12.5 MG tablet Take 1 tablet by mouth daily.   . Multiple Vitamins-Minerals (PRESERVISION/LUTEIN) CAPS Take by mouth 2 (two) times daily. Reported on 09/15/2015 11/04/2014: Received from: [facility] [facility]  . ranitidine (ZANTAC) 150 MG tablet Take 1 tablet (150 mg total) by mouth 2 (two) times daily.   . vitamin E 400 UNIT capsule Take 400 Units by mouth daily. Reported on 09/15/2015 11/04/2014: Received from: [facility] [facility]   No facility-administered encounter medications on file as of 06/06/2017.     Patient Care Team: Jerrol Banana., MD as PCP - General (Family Medicine) Agapito Games as Consulting Physician (Optometry)      Objective:   Vitals: There were no vitals filed for this visit.  Physical Exam  Constitutional: She is oriented to person, place, and time. She appears well-developed and well-nourished.  HENT:  Head: Normocephalic.  Right Ear: External ear normal.  Left Ear: External ear normal.  Eyes: Conjunctivae are normal. Pupils are equal, round, and reactive to light.  Neck: Normal range of motion. Neck supple.  Cardiovascular: Normal rate, regular rhythm, normal heart sounds and intact distal pulses. Exam reveals no gallop.  No murmur heard. Pulmonary/Chest: Effort normal and breath sounds normal. No respiratory distress. She has no wheezes. Right breast exhibits no inverted nipple, no mass and no skin change. Left breast exhibits no inverted nipple, no mass and no skin change.  Abdominal: She exhibits no distension. There is no tenderness.  Genitourinary: Vagina normal and uterus normal. Rectal exam shows guaiac negative stool. No vaginal discharge found.  Genitourinary Comments: S/p left mastectomy.  Musculoskeletal: She exhibits no edema or tenderness.  Neurological: She  is alert and oriented to person, place, and time.  Skin: No rash noted. No erythema.  Psychiatric: She has a normal mood and affect. Her behavior is normal. Judgment and thought content normal.     Depression Screen PHQ 2/9 Scores 06/06/2017 06/06/2017 05/10/2016 02/23/2015  PHQ - 2 Score 0 0 1 0  PHQ- 9 Score 2 2 - -    Assessment & Plan:   1. Annual physical exam Bimanual exam done today and DRE. - CBC with Differential/Platelet - Comprehensive metabolic panel - TSH - Lipid Profile  2. Colon cancer screening - IFOBT POC (occult bld, rslt in office); Future - IFOBT POC (occult bld, rslt in office)  3. Gastroesophageal reflux disease, esophagitis presence not specified Add omeprazole once in the morning daily and continue Ranitidine. - omeprazole (PRILOSEC) 20 MG capsule; Take 1 capsule (20 mg total) by mouth every morning.  Dispense: 30 capsule; Refill: 12  4. Essential hypertension - CBC with Differential/Platelet - Comprehensive metabolic panel  5. History of hyperglycemia -  HgB A1c 5.Breast Cancer  HPI, Exam and A&P transcribed by Tiffany Kocher, RMA under direction and in the presence of Miguel Aschoff, MD. I have done the exam and reviewed the chart and it is accurate to the best of my knowledge. Development worker, community has been used and  any errors in dictation or transcription are unintentional. Miguel Aschoff M.D. [facility]

## 2017-06-07 LAB — COMPLETE METABOLIC PANEL WITH GFR
AG Ratio: 1.4 (calc) (ref 1.0–2.5)
ALT: 24 U/L (ref 6–29)
AST: 22 U/L (ref 10–35)
Albumin: 4 g/dL (ref 3.6–5.1)
Alkaline phosphatase (APISO): 83 U/L (ref 33–130)
BUN/Creatinine Ratio: 24 (calc) — ABNORMAL HIGH (ref 6–22)
BUN: 23 mg/dL (ref 7–25)
CALCIUM: 9.4 mg/dL (ref 8.6–10.4)
CO2: 28 mmol/L (ref 20–32)
CREATININE: 0.94 mg/dL — AB (ref 0.60–0.93)
Chloride: 101 mmol/L (ref 98–110)
GFR, EST NON AFRICAN AMERICAN: 60 mL/min/{1.73_m2} (ref >=60)
GFR, Est African American: 70 mL/min/{1.73_m2} (ref >=60)
GLOBULIN: 2.9 g/dL (ref 1.9–3.7)
Glucose, Bld: 113 mg/dL — ABNORMAL HIGH (ref 65–99)
Potassium: 3.8 mmol/L (ref 3.5–5.3)
SODIUM: 138 mmol/L (ref 135–146)
TOTAL PROTEIN: 6.9 g/dL (ref 6.1–8.1)
Total Bilirubin: 0.5 mg/dL (ref 0.2–1.2)

## 2017-06-07 LAB — CBC WITH DIFFERENTIAL/PLATELET
BASOS ABS: 23 {cells}/uL (ref 0–200)
Basophils Relative: 0.3 %
EOS PCT: 3.1 %
Eosinophils Absolute: 233 cells/uL (ref 15–500)
HCT: 38.9 % (ref 35.0–45.0)
Hemoglobin: 13.5 g/dL (ref 11.7–15.5)
Lymphs Abs: 1928 cells/uL (ref 850–3900)
MCH: 32 pg (ref 27.0–33.0)
MCHC: 34.7 g/dL (ref 32.0–36.0)
MCV: 92.2 fL (ref 80.0–100.0)
MONOS PCT: 6 %
MPV: 11 fL (ref 7.5–12.5)
NEUTROS ABS: 4868 {cells}/uL (ref 1500–7800)
Neutrophils Relative %: 64.9 %
Platelets: 244 10*3/uL (ref 140–400)
RBC: 4.22 10*6/uL (ref 3.80–5.10)
RDW: 12.2 % (ref 11.0–15.0)
Total Lymphocyte: 25.7 %
WBC mixed population: 450 cells/uL (ref 200–950)
WBC: 7.5 10*3/uL (ref 3.8–10.8)

## 2017-06-07 LAB — HEPATITIS C ANTIBODY
HEP C AB: NONREACTIVE
SIGNAL TO CUT-OFF: 0.01 (ref <1.00)

## 2017-06-07 LAB — HEMOGLOBIN A1C
Hgb A1c MFr Bld: 5.8 % of total Hgb — ABNORMAL HIGH (ref <5.7)
MEAN PLASMA GLUCOSE: 120 (calc)
eAG (mmol/L): 6.6 (calc)

## 2017-06-07 LAB — LIPID PANEL
CHOLESTEROL: 172 mg/dL (ref <200)
HDL: 61 mg/dL (ref >50)
LDL Cholesterol (Calc): 90 mg/dL (calc)
Non-HDL Cholesterol (Calc): 111 mg/dL (calc) (ref <130)
Total CHOL/HDL Ratio: 2.8 (calc) (ref <5.0)
Triglycerides: 114 mg/dL (ref <150)

## 2017-06-07 LAB — TSH: TSH: 2.6 mIU/L (ref 0.40–4.50)

## 2017-06-14 ENCOUNTER — Ambulatory Visit
Admission: RE | Admit: 2017-06-14 | Discharge: 2017-06-14 | Disposition: A | Discharge status: Home / Self Care | Payer: Medicare Other | Source: Ambulatory Visit | Attending: Family Medicine | Admitting: Family Medicine

## 2017-06-14 DIAGNOSIS — Z1231 Encounter for screening mammogram for malignant neoplasm of breast: Secondary | ICD-10-CM | POA: Diagnosis not present

## 2017-06-14 HISTORY — DX: Personal history of antineoplastic chemotherapy: Z92.21

## 2017-06-19 LAB — IFOBT (OCCULT BLOOD): IMMUNOLOGICAL FECAL OCCULT BLOOD TEST: NEGATIVE

## 2017-06-20 ENCOUNTER — Other Ambulatory Visit: Payer: Self-pay | Admitting: Family Medicine

## 2017-06-20 DIAGNOSIS — I10 Essential (primary) hypertension: Secondary | ICD-10-CM

## 2017-07-05 ENCOUNTER — Other Ambulatory Visit: Payer: Self-pay | Admitting: Family Medicine

## 2017-09-04 ENCOUNTER — Ambulatory Visit (INDEPENDENT_AMBULATORY_CARE_PROVIDER_SITE_OTHER): Payer: Medicare Other | Admitting: Family Medicine

## 2017-09-04 ENCOUNTER — Encounter: Payer: Self-pay | Admitting: Family Medicine

## 2017-09-04 VITALS — BP 120/70 | HR 76 | Temp 97.7°F | Resp 16 | Wt 162.0 lb

## 2017-09-04 DIAGNOSIS — K219 Gastro-esophageal reflux disease without esophagitis: Secondary | ICD-10-CM

## 2017-09-04 DIAGNOSIS — I10 Essential (primary) hypertension: Secondary | ICD-10-CM

## 2017-09-04 DIAGNOSIS — C50912 Malignant neoplasm of unspecified site of left female breast: Secondary | ICD-10-CM

## 2017-09-04 NOTE — Progress Notes (Signed)
Patient: Erin Middleton Female    DOB: Nov 14, 1945   72 y.o.   MRN: [id number] Visit Date: 09/04/2017  Today's Provider: Wilhemena Durie, MD   Chief Complaint  Patient presents with  . Gastroesophageal Reflux   Subjective:    HPI Pt is here today for a follow up of GERD. She was started on Omeprazole daily. She is also taking ranitidine daily she reports that her GERD is controlled on this regimen. She can not skip a day. No side effects that she can tell.     No Known Allergies   Current Outpatient Medications:  .  amLODipine (NORVASC) 5 MG tablet, Take 1 tablet (5 mg total) by mouth every evening., Disp: 90 tablet, Rfl: 3 .  aspirin EC 325 MG tablet, Take 1 tablet (325 mg total) by mouth 2 (two) times daily. (Patient taking differently: Take 325 mg by mouth once. ), Disp: 100 tablet, Rfl: 0 .  Cholecalciferol (VITAMIN D PO), Take by mouth 2 (two) times daily. Pt unsure of dosage. , Disp: , Rfl:  .  ibuprofen (ADVIL,MOTRIN) 200 MG tablet, Take 400 mg by mouth 3 (three) times daily., Disp: , Rfl:  .  losartan-hydrochlorothiazide (HYZAAR) 100-12.5 MG tablet, TAKE 1 TABLET BY MOUTH DAILY., Disp: 90 tablet, Rfl: 3 .  Multiple Vitamins-Minerals (PRESERVISION/LUTEIN) CAPS, Take by mouth 2 (two) times daily. Reported on 09/15/2015, Disp: , Rfl:  .  omeprazole (PRILOSEC) 20 MG capsule, Take 1 capsule (20 mg total) by mouth every morning., Disp: 30 capsule, Rfl: 12 .  ranitidine (ZANTAC) 150 MG tablet, TAKE 1 TABLET (150 MG TOTAL) BY MOUTH 2 (TWO) TIMES DAILY., Disp: 60 tablet, Rfl: 6 .  vitamin E 400 UNIT capsule, Take 400 Units by mouth daily. Reported on 09/15/2015, Disp: , Rfl:   Review of Systems  Constitutional: Negative.   HENT: Negative.   Eyes: Negative.   Respiratory: Negative.   Cardiovascular: Negative.   Gastrointestinal: Negative.   Endocrine: Negative.   Genitourinary: Negative.   Musculoskeletal: Negative.   Skin: Negative.   Allergic/Immunologic: Negative.     Neurological: Negative.   Hematological: Negative.   Psychiatric/Behavioral: Negative.     Social History   Tobacco Use  . Smoking status: Never Smoker  . Smokeless tobacco: Never Used  Substance Use Topics  . Alcohol use: No    Alcohol/week: 0.0 oz   Objective:   BP 120/70 (BP Location: Right Arm, Patient Position: Sitting, Cuff Size: Normal)   Pulse 76   Temp 97.7 F (36.5 C) (Oral)   Resp 16   Wt 162 lb (73.5 kg)   BMI 29.63 kg/m  Vitals:   09/04/17 1108  BP: 120/70  Pulse: 76  Resp: 16  Temp: 97.7 F (36.5 C)  TempSrc: Oral  Weight: 162 lb (73.5 kg)     Physical Exam  Constitutional: She is oriented to person, place, and time. She appears well-developed and well-nourished.  HENT:  Head: Normocephalic and atraumatic.  Eyes: Conjunctivae are normal.  Neck: No thyromegaly present.  Cardiovascular: Normal rate, regular rhythm and normal heart sounds.  Pulmonary/Chest: Effort normal and breath sounds normal.  Abdominal: Soft.  Neurological: She is alert and oriented to person, place, and time.  Skin: Skin is warm and dry.  Psychiatric: She has a normal mood and affect. Her behavior is normal. Judgment and thought content normal.        Assessment & Plan:     GERD Probably lifleon PPI therapy.  H/o Breast cancer HTN.     I have done the exam and reviewed the chart and it is accurate to the best of my knowledge. Development worker, community has been used and  any errors in dictation or transcription are unintentional. Miguel Aschoff M.D. [place], MD  [facility]

## 2017-12-24 ENCOUNTER — Other Ambulatory Visit: Payer: Self-pay | Admitting: Family Medicine

## 2017-12-24 DIAGNOSIS — I10 Essential (primary) hypertension: Secondary | ICD-10-CM

## 2018-03-07 ENCOUNTER — Ambulatory Visit (INDEPENDENT_AMBULATORY_CARE_PROVIDER_SITE_OTHER): Payer: Medicare Other | Admitting: Family Medicine

## 2018-03-07 VITALS — BP 118/72 | HR 72 | Temp 97.6°F | Resp 16 | Wt 162.0 lb

## 2018-03-07 DIAGNOSIS — Z23 Encounter for immunization: Secondary | ICD-10-CM

## 2018-03-07 DIAGNOSIS — K219 Gastro-esophageal reflux disease without esophagitis: Secondary | ICD-10-CM

## 2018-03-07 DIAGNOSIS — I10 Essential (primary) hypertension: Secondary | ICD-10-CM

## 2018-03-07 DIAGNOSIS — R739 Hyperglycemia, unspecified: Secondary | ICD-10-CM

## 2018-03-07 NOTE — Patient Instructions (Signed)
Take Prilosec daily in the mornings, take ranitidine 1 tablet at lunch and 1 tablet at bedtime.

## 2018-03-07 NOTE — Progress Notes (Signed)
Erin Middleton  MRN: [id number] DOB: 09/03/1945  Subjective:  HPI   The patient is a 72 year old female who presents for follow up of her chronic health.  She was last seen on 09/04/17.    Hypertension-Her blood pressure has been stable.  Her last labs were done in December 2018.  BP Readings from Last 3 Encounters:  03/07/18 118/72  09/04/17 120/70  06/06/17 120/72   Hyperglycemia-Her last A1C was on 06/06/17 and it was 5.8.  GERD-patient states she has worsening GERD.  She is taking Prilosec and Ranitidine each one daily.  Her medication list had her Ranitidine at twice daily.    Patient complains of leg cramps.  She uses mustard and pickle juice.  She was once on Magnesium but states you had told her to stop.   Patient Active Problem List   Diagnosis Date Noted  . 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 Hypertension 01/18/2015  . Breast CA ([place]) 11/04/2014  . Acid reflux 11/04/2014  . Blood pressure elevated 11/04/2014  . Plantar fasciitis 11/04/2014  . H/O ear disorder 05/19/2014  . Breast cancer, female ([place]) 05/19/2014  . Dupuytren's contracture of foot 05/19/2014  . Avitaminosis D 09/21/2008  . 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 History of colon polyps 07/19/2006    Past Medical History:  Diagnosis Date  . Arthritis   . Breast cancer ([place]) 1985   left breast cancer - chemotherapy  . Cancer ([facility])    Breast  . GERD (gastroesophageal reflux disease)   . Headache    migraines h/o  . Hypertension   . Personal history of chemotherapy     Social History   Socioeconomic History  . Marital status: Married    Spouse name: Not on file  . Number of children: Not on file  . Years of education: Not on file  . Highest education level: Not on file  Occupational History  . Not on file  Social Needs  . Financial resource strain: Not on file  . Food insecurity:    Worry: Not on file    Inability: Not on file  . Transportation needs:    Medical: Not on file   Non-medical: Not on file  Tobacco Use  . Smoking status: Never Smoker  . Smokeless tobacco: Never Used  Substance and Sexual Activity  . Alcohol use: No    Alcohol/week: 0.0 standard drinks  . Drug use: No  . Sexual activity: Never  Lifestyle  . Physical activity:    Days per week: Not on file    Minutes per session: Not on file  . Stress: Not on file  Relationships  . Social connections:    Talks on phone: Not on file    Gets together: Not on file    Attends religious service: Not on file    Active member of club or organization: Not on file    Attends meetings of clubs or organizations: Not on file    Relationship status: Not on file  . Intimate partner violence:    Fear of current or ex partner: Not on file    Emotionally abused: Not on file    Physically abused: Not on file    Forced sexual activity: Not on file  Other Topics Concern  . Not on file  Social History Narrative  . Not on file    Outpatient Encounter Medications as of 03/07/2018  Medication Sig Note  . amLODipine (NORVASC) 5 MG tablet TAKE 1 TABLET  BY MOUTH EVERY DAY IN THE EVENING   . aspirin EC 325 MG tablet Take 1 tablet (325 mg total) by mouth 2 (two) times daily. (Patient taking differently: Take 325 mg by mouth once. )   . Cholecalciferol (VITAMIN D PO) Take by mouth 2 (two) times daily. Pt unsure of dosage.    Marland Kitchen ibuprofen (ADVIL,MOTRIN) 200 MG tablet Take 400 mg by mouth 3 (three) times daily.   Marland Kitchen losartan-hydrochlorothiazide (HYZAAR) 100-12.5 MG tablet TAKE 1 TABLET BY MOUTH DAILY.   . Multiple Vitamins-Minerals (PRESERVISION/LUTEIN) CAPS Take by mouth 2 (two) times daily. Reported on 09/15/2015 11/04/2014: Received from: [facility] [facility]  . omeprazole (PRILOSEC) 20 MG capsule Take 1 capsule (20 mg total) by mouth every morning.   . ranitidine (ZANTAC) 150 MG tablet TAKE 1 TABLET (150 MG TOTAL) BY MOUTH 2 (TWO) TIMES DAILY. 03/07/2018: Patient is only taking this once a day.  . vitamin E 400  UNIT capsule Take 400 Units by mouth daily. Reported on 09/15/2015 11/04/2014: Received from: [employer] [facility]   No facility-administered encounter medications on file as of 03/07/2018.     No Known Allergies  Review of Systems  Constitutional: Negative for fever and malaise/fatigue.  Eyes: Negative.   Respiratory: Negative for cough, shortness of breath and wheezing.   Cardiovascular: Positive for leg swelling (leg cramps). Negative for chest pain, palpitations, orthopnea and claudication.  Gastrointestinal: Positive for heartburn. Negative for abdominal pain, constipation, diarrhea, nausea and vomiting.  Musculoskeletal: Positive for back pain (chronic) and myalgias (muscle cramps in her legs).  Neurological: Negative.   Endo/Heme/Allergies: Negative.   Psychiatric/Behavioral: Negative.     Objective:  BP 118/72 (BP Location: Right Arm, Patient Position: Sitting, Cuff Size: Normal)   Pulse 72   Temp 97.6 F (36.4 C) (Oral)   Resp 16   Wt 162 lb (73.5 kg)   SpO2 96%   BMI 29.63 kg/m   Physical Exam  Constitutional: She is oriented to person, place, and time and well-developed, well-nourished, and in no distress.  HENT:  Head: Normocephalic and atraumatic.  Right Ear: External ear normal.  Left Ear: External ear normal.  Nose: Nose normal.  Mouth/Throat: Oropharynx is clear and moist.  Eyes: Conjunctivae are normal. No scleral icterus.  Neck: No thyromegaly present.  Cardiovascular: Normal rate, regular rhythm and normal heart sounds.  Pulmonary/Chest: Effort normal and breath sounds normal.  Abdominal: Soft.  Musculoskeletal: She exhibits no edema.  Neurological: She is alert and oriented to person, place, and time. Gait normal. GCS score is 15.  Skin: Skin is warm and dry.  Psychiatric: Mood, memory, affect and judgment normal.    Assessment and Plan :  1. Essential hypertension  - CBC with Differential/Platelet - Comprehensive metabolic panel -  TSH  2. Gastroesophageal reflux disease without esophagitis  - CBC with Differential/Platelet  3. Hyperglycemia  - Hemoglobin A1c  4. Influenza vaccine needed  - Flu vaccine HIGH DOSE PF (Fluzone High dose)  5. Need for influenza vaccination  6.h/o Breast cancer  I have done the exam and reviewed the chart and it is accurate to the best of my knowledge. Development worker, community has been used and  any errors in dictation or transcription are unintentional. Miguel Aschoff M.D. [facility]

## 2018-03-08 LAB — CBC WITH DIFFERENTIAL/PLATELET
Basophils Absolute: 0 10*3/uL (ref 0.0–0.2)
Basos: 0 %
EOS (ABSOLUTE): 0.3 10*3/uL (ref 0.0–0.4)
EOS: 5 %
Hematocrit: 40.9 % (ref 34.0–46.6)
Hemoglobin: 13.6 g/dL (ref 11.1–15.9)
IMMATURE GRANS (ABS): 0 10*3/uL (ref 0.0–0.1)
Immature Granulocytes: 0 %
LYMPHS: 25 %
Lymphocytes Absolute: 1.4 10*3/uL (ref 0.7–3.1)
MCH: 31.1 pg (ref 26.6–33.0)
MCHC: 33.3 g/dL (ref 31.5–35.7)
MCV: 94 fL (ref 79–97)
MONOCYTES: 6 %
MONOS ABS: 0.4 10*3/uL (ref 0.1–0.9)
NEUTROS ABS: 3.7 10*3/uL (ref 1.4–7.0)
Neutrophils: 64 %
PLATELETS: 249 10*3/uL (ref 150–450)
RBC: 4.37 x10E6/uL (ref 3.77–5.28)
RDW: 13.6 % (ref 12.3–15.4)
WBC: 5.8 10*3/uL (ref 3.4–10.8)

## 2018-03-08 LAB — COMPREHENSIVE METABOLIC PANEL
ALK PHOS: 85 IU/L (ref 39–117)
ALT: 25 IU/L (ref 0–32)
AST: 22 IU/L (ref 0–40)
Albumin/Globulin Ratio: 1.5 (ref 1.2–2.2)
Albumin: 4.3 g/dL (ref 3.5–4.8)
BILIRUBIN TOTAL: 0.4 mg/dL (ref 0.0–1.2)
BUN/Creatinine Ratio: 24 (ref 12–28)
BUN: 23 mg/dL (ref 8–27)
CHLORIDE: 102 mmol/L (ref 96–106)
CO2: 23 mmol/L (ref 20–29)
Calcium: 9.7 mg/dL (ref 8.7–10.3)
Creatinine, Ser: 0.95 mg/dL (ref 0.57–1.00)
GFR calc Af Amer: 68 mL/min/{1.73_m2} (ref >59)
GFR calc non Af Amer: 59 mL/min/{1.73_m2} — ABNORMAL LOW (ref >59)
GLUCOSE: 142 mg/dL — AB (ref 65–99)
Globulin, Total: 2.9 g/dL (ref 1.5–4.5)
POTASSIUM: 4.2 mmol/L (ref 3.5–5.2)
Sodium: 142 mmol/L (ref 134–144)
Total Protein: 7.2 g/dL (ref 6.0–8.5)

## 2018-03-08 LAB — HEMOGLOBIN A1C
Est. average glucose Bld gHb Est-mCnc: 131 mg/dL
HEMOGLOBIN A1C: 6.2 % — AB (ref 4.8–5.6)

## 2018-03-08 LAB — TSH: TSH: 3.19 u[IU]/mL (ref 0.450–4.500)

## 2018-04-04 ENCOUNTER — Other Ambulatory Visit: Payer: Self-pay | Admitting: Family Medicine

## 2018-04-18 ENCOUNTER — Other Ambulatory Visit: Payer: Self-pay | Admitting: Family Medicine

## 2018-04-18 DIAGNOSIS — I10 Essential (primary) hypertension: Secondary | ICD-10-CM

## 2018-04-22 NOTE — Telephone Encounter (Signed)
It appears the pharmacy is requesting we change her Losartan HCTZ to just Losartan due to a recall.  Do you want her to take as 2 separate pills or just the Losartan

## 2018-04-25 NOTE — Telephone Encounter (Signed)
Separate --thanks

## 2018-05-20 ENCOUNTER — Other Ambulatory Visit: Payer: Self-pay

## 2018-05-20 MED ORDER — LOSARTAN POTASSIUM 100 MG PO TABS: 100.0000 mg | ORAL_TABLET | Freq: Every day | ORAL | 3 refills | Status: DC

## 2018-05-20 MED ORDER — HYDROCHLOROTHIAZIDE 12.5 MG PO CAPS: 12.5000 mg | ORAL_CAPSULE | Freq: Every day | ORAL | 3 refills | Status: DC

## 2018-05-20 NOTE — Progress Notes (Signed)
rtan

## 2018-06-05 ENCOUNTER — Other Ambulatory Visit: Payer: Self-pay | Admitting: Family Medicine

## 2018-06-05 DIAGNOSIS — Z1231 Encounter for screening mammogram for malignant neoplasm of breast: Secondary | ICD-10-CM

## 2018-06-14 ENCOUNTER — Other Ambulatory Visit: Payer: Self-pay | Admitting: Family Medicine

## 2018-06-14 DIAGNOSIS — K219 Gastro-esophageal reflux disease without esophagitis: Secondary | ICD-10-CM

## 2018-06-19 ENCOUNTER — Ambulatory Visit
Admission: RE | Admit: 2018-06-19 | Discharge: 2018-06-19 | Disposition: A | Discharge status: Home / Self Care | Payer: Medicare Other | Source: Ambulatory Visit | Attending: Family Medicine | Admitting: Family Medicine

## 2018-06-19 DIAGNOSIS — Z1231 Encounter for screening mammogram for malignant neoplasm of breast: Secondary | ICD-10-CM | POA: Insufficient documentation

## 2018-07-09 ENCOUNTER — Ambulatory Visit (INDEPENDENT_AMBULATORY_CARE_PROVIDER_SITE_OTHER): Payer: Medicare Other | Admitting: Family Medicine

## 2018-07-09 ENCOUNTER — Ambulatory Visit (INDEPENDENT_AMBULATORY_CARE_PROVIDER_SITE_OTHER): Payer: Medicare Other

## 2018-07-09 VITALS — BP 132/76 | HR 69 | Temp 98.1°F | Ht 62.0 in | Wt 156.6 lb

## 2018-07-09 VITALS — BP 132/76 | HR 69 | Temp 98.1°F | Ht 62.0 in | Wt 156.0 lb

## 2018-07-09 DIAGNOSIS — M79604 Pain in right leg: Secondary | ICD-10-CM

## 2018-07-09 DIAGNOSIS — I10 Essential (primary) hypertension: Secondary | ICD-10-CM

## 2018-07-09 DIAGNOSIS — Z0000 Encounter for general adult medical examination without abnormal findings: Secondary | ICD-10-CM

## 2018-07-09 DIAGNOSIS — R7303 Prediabetes: Secondary | ICD-10-CM

## 2018-07-09 DIAGNOSIS — R011 Cardiac murmur, unspecified: Secondary | ICD-10-CM | POA: Diagnosis not present

## 2018-07-09 NOTE — Patient Instructions (Addendum)
[name] , Thank you for taking time to come for your Medicare Wellness Visit. I appreciate your ongoing commitment to your health goals. Please review the following plan we discussed and let me know if I can assist you in the future.   Screening recommendations/referrals: Colonoscopy: Up to date, due 03/2020 Mammogram: Up to date, due 06/2020 Bone Density: Up to date, due 03/2022 Recommended yearly ophthalmology/optometry visit for glaucoma screening and checkup Recommended yearly dental visit for hygiene and checkup  Vaccinations: Influenza vaccine: Up to date Pneumococcal vaccine: Completed series Tdap vaccine: Pt declines today.  Shingles vaccine: Pt declines today.     Advanced directives: Please bring a copy of your POA (Power of Attorney) and/or Living Will to your next appointment.   Conditions/risks identified: Continue trying to walk 3 days a week for at least 30 minutes at a time.   Next appointment: 10:20 AM with Dr Rosanna [name].    Preventive Care 9 Years and Older, Female Preventive care refers to lifestyle choices and visits with your health care provider that can promote health and wellness. What does preventive care include?  A yearly physical exam. This is also called an annual well check.  Dental exams once or twice a year.  Routine eye exams. Ask your health care provider how often you should have your eyes checked.  Personal lifestyle choices, including:  Daily care of your teeth and gums.  Regular physical activity.  Eating a healthy diet.  Avoiding tobacco and drug use.  Limiting alcohol use.  Practicing safe sex.  Taking low-dose aspirin every day.  Taking vitamin and mineral supplements as recommended by your health care provider. What happens during an annual well check? The services and screenings done by your health care provider during your annual well check will depend on your age, overall health, lifestyle risk factors, and family history  of disease. Counseling  Your health care provider may ask you questions about your:  Alcohol use.  Tobacco use.  Drug use.  Emotional well-being.  Home and relationship well-being.  Sexual activity.  Eating habits.  History of falls.  Memory and ability to understand (cognition).  Work and work Statistician.  Reproductive health. Screening  You may have the following tests or measurements:  Height, weight, and BMI.  Blood pressure.  Lipid and cholesterol levels. These may be checked every 5 years, or more frequently if you are over 36 years old.  Skin check.  Lung cancer screening. You may have this screening every year starting at age 52 if you have a 30-pack-year history of smoking and currently smoke or have quit within the past 15 years.  Fecal occult blood test (FOBT) of the stool. You may have this test every year starting at age 2.  Flexible sigmoidoscopy or colonoscopy. You may have a sigmoidoscopy every 5 years or a colonoscopy every 10 years starting at age 47.  Hepatitis C blood test.  Hepatitis B blood test.  Sexually transmitted disease (STD) testing.  Diabetes screening. This is done by checking your blood sugar (glucose) after you have not eaten for a while (fasting). You may have this done every 1-3 years.  Bone density scan. This is done to screen for osteoporosis. You may have this done starting at age 6.  Mammogram. This may be done every 1-2 years. Talk to your health care provider about how often you should have regular mammograms. Talk with your health care provider about your test results, treatment options, and if necessary, the  need for more tests. Vaccines  Your health care provider may recommend certain vaccines, such as:  Influenza vaccine. This is recommended every year.  Tetanus, diphtheria, and acellular pertussis (Tdap, Td) vaccine. You may need a Td booster every 10 years.  Zoster vaccine. You may need this after age  25.  Pneumococcal 13-valent conjugate (PCV13) vaccine. One dose is recommended after age 61.  Pneumococcal polysaccharide (PPSV23) vaccine. One dose is recommended after age 7. Talk to your health care provider about which screenings and vaccines you need and how often you need them. This information is not intended to replace advice given to you by your health care provider. Make sure you discuss any questions you have with your health care provider. Document Released: 07/16/2015 Document Revised: 03/08/2016 Document Reviewed: 04/20/2015 Elsevier Interactive Patient Education  2017 [employer] Prevention in the Home Falls can cause injuries. They can happen to people of all ages. There are many things you can do to make your home safe and to help prevent falls. What can I do on the outside of my home?  Regularly fix the edges of walkways and driveways and fix any cracks.  Remove anything that might make you trip as you walk through a door, such as a raised step or threshold.  Trim any bushes or trees on the path to your home.  Use bright outdoor lighting.  Clear any walking paths of anything that might make someone trip, such as rocks or tools.  Regularly check to see if handrails are loose or broken. Make sure that both sides of any steps have handrails.  Any raised decks and porches should have guardrails on the edges.  Have any leaves, snow, or ice cleared regularly.  Use sand or salt on walking paths during winter.  Clean up any spills in your garage right away. This includes oil or grease spills. What can I do in the bathroom?  Use night lights.  Install grab bars by the toilet and in the tub and shower. Do not use towel bars as grab bars.  Use non-skid mats or decals in the tub or shower.  If you need to sit down in the shower, use a plastic, non-slip stool.  Keep the floor dry. Clean up any water that spills on the floor as soon as it happens.  Remove  soap buildup in the tub or shower regularly.  Attach bath mats securely with double-sided non-slip rug tape.  Do not have throw rugs and other things on the floor that can make you trip. What can I do in the bedroom?  Use night lights.  Make sure that you have a light by your bed that is easy to reach.  Do not use any sheets or blankets that are too big for your bed. They should not hang down onto the floor.  Have a firm chair that has side arms. You can use this for support while you get dressed.  Do not have throw rugs and other things on the floor that can make you trip. What can I do in the kitchen?  Clean up any spills right away.  Avoid walking on wet floors.  Keep items that you use a lot in easy-to-reach places.  If you need to reach something above you, use a strong step stool that has a grab bar.  Keep electrical cords out of the way.  Do not use floor polish or wax that makes floors slippery. If you must use wax,  use non-skid floor wax.  Do not have throw rugs and other things on the floor that can make you trip. What can I do with my stairs?  Do not leave any items on the stairs.  Make sure that there are handrails on both sides of the stairs and use them. Fix handrails that are broken or loose. Make sure that handrails are as Schroth as the stairways.  Check any carpeting to make sure that it is firmly attached to the stairs. Fix any carpet that is loose or worn.  Avoid having throw rugs at the top or bottom of the stairs. If you do have throw rugs, attach them to the floor with carpet tape.  Make sure that you have a light switch at the top of the stairs and the bottom of the stairs. If you do not have them, ask someone to add them for you. What else can I do to help prevent falls?  Wear shoes that:  Do not have high heels.  Have rubber bottoms.  Are comfortable and fit you well.  Are closed at the toe. Do not wear sandals.  If you use a  stepladder:  Make sure that it is fully opened. Do not climb a closed stepladder.  Make sure that both sides of the stepladder are locked into place.  Ask someone to hold it for you, if possible.  Clearly mark and make sure that you can see:  Any grab bars or handrails.  First and last steps.  Where the edge of each step is.  Use tools that help you move around (mobility aids) if they are needed. These include:  Canes.  Walkers.  Scooters.  Crutches.  Turn on the lights when you go into a dark area. Replace any light bulbs as soon as they burn out.  Set up your furniture so you have a clear path. Avoid moving your furniture around.  If any of your floors are uneven, fix them.  If there are any pets around you, be aware of where they are.  Review your medicines with your doctor. Some medicines can make you feel dizzy. This can increase your chance of falling. Ask your doctor what other things that you can do to help prevent falls. This information is not intended to replace advice given to you by your health care provider. Make sure you discuss any questions you have with your health care provider. Document Released: 04/15/2009 Document Revised: 11/25/2015 Document Reviewed: 07/24/2014 Elsevier Interactive Patient Education  2017 [employer].

## 2018-07-09 NOTE — Progress Notes (Signed)
Patient: Erin Middleton, Female    DOB: December 20, 1945, 73 y.o.   MRN: [id number] Visit Date: 07/09/2018  Today's Provider: Wilhemena Durie, MD   Chief Complaint  Patient presents with  . Annual Exam   Subjective:  Erin Middleton is a 73 y.o. female who presents today for health maintenance and complete physical. She feels well. She reports exercising none. She reports she is sleeping fairly well.  Immunization History  Administered Date(s) Administered  . Influenza, High Dose Seasonal PF 05/10/2016, 06/06/2017, 03/07/2018  . Influenza-Unspecified 04/09/2015  . Pneumococcal Conjugate-13 05/10/2016  . Pneumococcal Polysaccharide-23 03/06/2012  . Td 01/23/2003   04/01/15 Colonoscopy, Dr. Oh-Diverticulosis, repeat in 5 years. 06/19/18 Mammogram-negative 03/14/16 BMD-osteopenia 05/10/16 Pap-negative   Review of Systems  Constitutional: Negative.   HENT: Negative.   Eyes: Negative.   Respiratory: Negative.   Cardiovascular: Negative.   Gastrointestinal: Negative.   Endocrine: Negative.   Genitourinary: Negative.   Musculoskeletal: Negative.        Left leg/hip pain .  Skin: Negative.   Allergic/Immunologic: Negative.   Neurological: Positive for weakness (balance).  Hematological: Negative.   Psychiatric/Behavioral: Negative.     Social History   Socioeconomic History  . Marital status: Married    Spouse name: Not on file  . Number of children: 2  . Years of education: Not on file  . Highest education level: Some college, no degree  Occupational History  . Occupation: retired  Scientific laboratory technician  . Financial resource strain: Somewhat hard  . Food insecurity:    Worry: Never true    Inability: Never true  . Transportation needs:    Medical: No    Non-medical: No  Tobacco Use  . Smoking status: Never Smoker  . Smokeless tobacco: Never Used  Substance and Sexual Activity  . Alcohol use: No    Alcohol/week: 0.0 standard drinks  . Drug use: No  . Sexual activity: Never   Lifestyle  . Physical activity:    Days per week: 0 days    Minutes per session: 0 min  . Stress: Not at all  Relationships  . Social connections:    Talks on phone: Patient refused    Gets together: Patient refused    Attends religious service: Patient refused    Active member of club or organization: Patient refused    Attends meetings of clubs or organizations: Patient refused    Relationship status: Patient refused  . Intimate partner violence:    Fear of current or ex partner: Patient refused    Emotionally abused: Patient refused    Physically abused: Patient refused    Forced sexual activity: Patient refused  Other Topics Concern  . Not on file  Social History Narrative  . Not on file    Patient Active Problem List   Diagnosis Date Noted  . 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 Hypertension 01/18/2015  . Breast CA ([place]) 11/04/2014  . Acid reflux 11/04/2014  . Blood pressure elevated 11/04/2014  . Plantar fasciitis 11/04/2014  . H/O ear disorder 05/19/2014  . Breast cancer, female ([place]) 05/19/2014  . Dupuytren's contracture of foot 05/19/2014  . Avitaminosis D 09/21/2008  . 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 History of colon polyps 07/19/2006    Past Surgical History:  Procedure Laterality Date  . COLONOSCOPY WITH PROPOFOL N/A 04/01/2015   Procedure: COLONOSCOPY WITH PROPOFOL;  Surgeon: Hulen Luster, MD;  Location: [facility] ENDOSCOPY;  Service: Gastroenterology;  Laterality: N/A;  . DILATION AND CURETTAGE OF  UTERUS    . MASTECTOMY Left   . NECK SURGERY     neck deformity-straighten out neck as a child  . PALATE / UVULA BIOPSY / EXCISION    . TOTAL KNEE ARTHROPLASTY Left 09/22/2015   Procedure: TOTAL KNEE ARTHROPLASTY;  Surgeon: Earnestine Leys, MD;  Location: [facility] ORS;  Service: Orthopedics;  Laterality: Left;    Her family history includes Dementia in her mother; Diabetes in her brother, brother, and mother; Heart disease in her father; Lung cancer in her father;  Stroke in her brother and mother; Throat cancer in her brother.     Outpatient Encounter Medications as of 07/09/2018  Medication Sig Note  . amLODipine (NORVASC) 5 MG tablet TAKE 1 TABLET BY MOUTH EVERY DAY IN THE EVENING   . aspirin EC 325 MG tablet Take 1 tablet (325 mg total) by mouth 2 (two) times daily. (Patient taking differently: Take 325 mg by mouth once. )   . Cholecalciferol (VITAMIN D PO) Take by mouth 2 (two) times daily. Pt unsure of dosage.    . hydrochlorothiazide (MICROZIDE) 12.5 MG capsule Take 1 capsule (12.5 mg total) by mouth daily.   Marland Kitchen ibuprofen (ADVIL,MOTRIN) 200 MG tablet Take 400 mg by mouth 3 (three) times daily.   Marland Kitchen losartan (COZAAR) 100 MG tablet Take 1 tablet (100 mg total) by mouth daily.   . Multiple Vitamins-Minerals (PRESERVISION/LUTEIN) CAPS Take by mouth 2 (two) times daily. Reported on 09/15/2015 11/04/2014: Received from: [facility]  . omeprazole (PRILOSEC) 20 MG capsule TAKE 1 CAPSULE BY MOUTH EVERY DAY IN THE MORNING   . ranitidine (ZANTAC) 150 MG tablet TAKE 1 TABLET (150 MG TOTAL) BY MOUTH 2 (TWO) TIMES DAILY. (Patient taking differently: Take 150 mg by mouth at bedtime. )   . vitamin E 400 UNIT capsule Take 400 Units by mouth daily. Reported on 09/15/2015 11/04/2014: Received from: [facility]  . [DISCONTINUED] losartan-hydrochlorothiazide (HYZAAR) 100-12.5 MG tablet TAKE 1 TABLET BY MOUTH DAILY. (Patient not taking: Reported on 07/09/2018)    No facility-administered encounter medications on file as of 07/09/2018.     Patient Care Team: Jerrol Banana., MD as PCP - General (Family Medicine) Earnestine Leys, MD as Consulting Physician (Specialist)      Objective:   Vitals:  Vitals:   07/09/18 0957  BP: 132/76  Pulse: 69  Temp: 98.1 F (36.7 C)  TempSrc: Oral  Weight: 156 lb (70.8 kg)  Height: 5\' 2"  (1.575 m)    Physical Exam Exam conducted with a chaperone present.  Constitutional:      Appearance:  Normal appearance. She is normal weight.  HENT:     Head: Normocephalic and atraumatic.     Right Ear: Tympanic membrane normal.     Left Ear: Tympanic membrane normal.     Nose: Nose normal.     Mouth/Throat:     Mouth: Mucous membranes are moist.     Pharynx: Oropharynx is clear.  Eyes:     Extraocular Movements: Extraocular movements intact.     Conjunctiva/sclera: Conjunctivae normal.     Pupils: Pupils are equal, round, and reactive to light.  Neck:     Musculoskeletal: Normal range of motion and neck supple.  Cardiovascular:     Rate and Rhythm: Normal rate and regular rhythm.     Pulses: Normal pulses.     Heart sounds: Murmur present.     Comments: 2/6 murmur at LUSB. Pulmonary:     Effort: Pulmonary effort is  normal.     Breath sounds: Normal breath sounds.  Chest:     Breasts:        Right: No mass.        Left: Absent.  Abdominal:     General: Abdomen is flat. Bowel sounds are normal.     Palpations: Abdomen is soft.  Musculoskeletal: Normal range of motion.  Skin:    General: Skin is warm and dry.  Neurological:     General: No focal deficit present.     Mental Status: She is alert and oriented to person, place, and time. Mental status is at baseline.     Motor: No weakness.     Gait: Gait normal.  Psychiatric:        Mood and Affect: Mood normal.        Behavior: Behavior normal.        Thought Content: Thought content normal.        Judgment: Judgment normal.      Depression Screen PHQ 2/9 Scores 07/09/2018 07/09/2018 06/06/2017 06/06/2017  PHQ - 2 Score 0 0 0 0  PHQ- 9 Score 3 - 2 2      Assessment & Plan:     Routine Health Maintenance and Physical Exam  Exercise Activities and Dietary recommendations Goals    . Exercise 3x per week (30 min per time)     Recommend to start walking at least 3 times a week for 30 minutes at a time.       Immunization History  Administered Date(s) Administered  . Influenza, High Dose Seasonal PF 05/10/2016,  06/06/2017, 03/07/2018  . Influenza-Unspecified 04/09/2015  . Pneumococcal Conjugate-13 05/10/2016  . Pneumococcal Polysaccharide-23 03/06/2012  . Td 01/23/2003    Health Maintenance  Topic Date Due  . Samul Dada  01/22/2013  . COLONOSCOPY  03/31/2020  . MAMMOGRAM  06/19/2020  . DEXA SCAN  03/14/2021  . INFLUENZA VACCINE  Completed  . Hepatitis C Screening  Completed  . PNA vac Low Risk Adult  Completed     Discussed health benefits of physical activity, and encouraged her to engage in regular exercise appropriate for her age and condition. Pelvic exam 2021. 1. Annual physical exam Pelvic and colonoscopy 2021.  2. Essential hypertension  - CBC with Differential/Platelet - Comprehensive metabolic panel - TSH  3. Pre-diabetes  - Hemoglobin A1c - Lipid Panel With LDL/HDL Ratio  4. Right leg pain  - Ambulatory referral to Orthopedic Surgery - Sedimentation rate  5. Murmur This seems to be a new finding. Refer for baseline exam. - Ambulatory referral to Cardiology    I have done the exam and reviewed the chart and it is accurate to the best of my knowledge. Development worker, community has been used and  any errors in dictation or transcription are unintentional. Miguel Aschoff M.D. [facility]

## 2018-07-09 NOTE — Progress Notes (Signed)
Subjective:   Erin Middleton is a 73 y.o. female who presents for Medicare Annual (Subsequent) preventive examination.  Review of Systems:  N/A  Cardiac Risk Factors include: advanced age (>12men, >83 women);hypertension     Objective:     Vitals: BP 132/76 (BP Location: Right Arm)   Pulse 69   Temp 98.1 F (36.7 C) (Oral)   Ht 5\' 2"  (1.575 m)   Wt 156 lb 9.6 oz (71 kg)   BMI 28.64 kg/m   Body mass index is 28.64 kg/m.  Advanced Directives 07/09/2018 06/06/2017 05/10/2016 09/22/2015 08/30/2015 02/23/2015 12/17/2014  Does Patient Have a Medical Advance Directive? Yes Yes Yes Yes Yes Yes Yes  Type of Paramedic of [place];Living will [place];Living will - Living will;Healthcare Power of [place];Living will Living will [place];Living will  Does patient want to make changes to medical advance directive? - - - No - Patient declined - - -  Copy of [place] in Chart? No - copy requested No - copy requested - No - copy requested - - -    Tobacco Social History   Tobacco Use  Smoking Status Never Smoker  Smokeless Tobacco Never Used     Counseling given: Not Answered   Clinical Intake:  Pre-visit preparation completed: Yes  Pain : 0-10 Pain Score: 5  Pain Type: Acute pain Pain Location: Leg Pain Orientation: Right Pain Descriptors / Indicators: Aching, Sore Pain Onset: More than a month ago Pain Frequency: Intermittent     Nutritional Status: BMI 25 -29 Overweight Nutritional Risks: None Diabetes: No  How often do you need to have someone help you when you read instructions, pamphlets, or other written materials from your doctor or pharmacy?: 1 - Never  Interpreter Needed?: No  Information entered by :: [facility], LPN  Past Medical History:  Diagnosis Date  . Arthritis   . Breast cancer ([place]) 1985   left breast cancer - chemotherapy  . Cancer  (HCC)    Breast  . GERD (gastroesophageal reflux disease)   . Headache    migraines h/o  . Hypertension   . Personal history of chemotherapy    Past Surgical History:  Procedure Laterality Date  . COLONOSCOPY WITH PROPOFOL N/A 04/01/2015   Procedure: COLONOSCOPY WITH PROPOFOL;  Surgeon: Hulen Luster, MD;  Location[facility] ENDOSCOPY;  Service: Gastroenterology;  Laterality: N/A;  . DILATION AND CURETTAGE OF UTERUS    . MASTECTOMY Left   . NECK SURGERY     neck deformity-straighten out neck as a child  . PALATE / UVULA BIOPSY / EXCISION    . 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 Diabetes Mother   . Stroke Mother   . Dementia Mother   . Lung cancer Father   . Heart disease Father   . Stroke Brother   . Diabetes Brother   . Throat cancer Brother   . Diabetes Brother   . Breast cancer Neg Hx    Social History   Socioeconomic History  . Marital status: Married    Spouse name: Not on file  . Number of children: 2  . Years of education: Not on file  . Highest education level: Some college, no degree  Occupational History  .  Occupation: retired  Scientific laboratory technician  . Financial resource strain: Somewhat hard  . Food insecurity:    Worry: Never true    Inability: Never true  . Transportation needs:    Medical: No    Non-medical: No  Tobacco Use  . Smoking status: Never Smoker  . Smokeless tobacco: Never Used  Substance and Sexual Activity  . Alcohol use: No    Alcohol/week: 0.0 standard drinks  . Drug use: No  . Sexual activity: Never  Lifestyle  . Physical activity:    Days per week: 0 days    Minutes per session: 0 min  . Stress: Not at all  Relationships  . Social connections:    Talks on phone: Patient refused    Gets together: Patient refused    Attends religious service: Patient refused      Active member of club or organization: Patient refused    Attends meetings of clubs or organizations: Patient refused    Relationship status: Patient refused  Other Topics Concern  . Not on file  Social History Narrative  . Not on file    Outpatient Encounter Medications as of 07/09/2018  Medication Sig  . amLODipine (NORVASC) 5 MG tablet TAKE 1 TABLET BY MOUTH EVERY DAY IN THE EVENING  . aspirin EC 325 MG tablet Take 1 tablet (325 mg total) by mouth 2 (two) times daily. (Patient taking differently: Take 325 mg by mouth once. )  . Cholecalciferol (VITAMIN D PO) Take by mouth 2 (two) times daily. Pt unsure of dosage.   . hydrochlorothiazide (MICROZIDE) 12.5 MG capsule Take 1 capsule (12.5 mg total) by mouth daily.  Marland Kitchen ibuprofen (ADVIL,MOTRIN) 200 MG tablet Take 400 mg by mouth 3 (three) times daily.  Marland Kitchen losartan (COZAAR) 100 MG tablet Take 1 tablet (100 mg total) by mouth daily.  . Multiple Vitamins-Minerals (PRESERVISION/LUTEIN) CAPS Take by mouth 2 (two) times daily. Reported on 09/15/2015  . omeprazole (PRILOSEC) 20 MG capsule TAKE 1 CAPSULE BY MOUTH EVERY DAY IN THE MORNING  . ranitidine (ZANTAC) 150 MG tablet TAKE 1 TABLET (150 MG TOTAL) BY MOUTH 2 (TWO) TIMES DAILY. (Patient taking differently: Take 150 mg by mouth at bedtime. )  . losartan-hydrochlorothiazide (HYZAAR) 100-12.5 MG tablet TAKE 1 TABLET BY MOUTH DAILY. (Patient not taking: Reported on 07/09/2018)  . vitamin E 400 UNIT capsule Take 400 Units by mouth daily. Reported on 09/15/2015   No facility-administered encounter medications on file as of 07/09/2018.     Activities of Daily Living In your present state of health, do you have any difficulty performing the following activities: 07/09/2018  Hearing? N  Vision? Y  Comment Has trouble seeing at night. Wears readers as needed.   Difficulty concentrating or making decisions? N  Walking or climbing stairs? Y  Comment Due to knee pains.   Dressing or bathing? N  Doing errands,  shopping? N  Preparing Food and eating ? N  Using the Toilet? N  In the past six months, have you accidently leaked urine? N  Do you have problems with loss of bowel control? N  Managing your Medications? N  Managing your Finances? N  Housekeeping or managing your Housekeeping? N  Some recent data might be hidden    Patient Care Team: Jerrol Banana., MD as PCP - General (Family Medicine) Earnestine Leys, MD as Consulting Physician (Specialist)    Assessment:   This is a routine wellness examination for Ginevra.  Exercise Activities and Dietary recommendations Current  Exercise Habits: The patient does not participate in regular exercise at present, Exercise limited by: None identified  Goals    . Exercise 3x per week (30 min per time)     Recommend to start walking at least 3 times a week for 30 minutes at a time.       Fall Risk Fall Risk  07/09/2018 06/06/2017 05/10/2016 02/23/2015  Falls in the past year? 1 No No Yes  Number falls in past yr: 1 - - 1  Injury with Fall? 0 - - No  Risk for fall due to : Impaired balance/gait - - -  Follow up Falls prevention discussed - - -   FALL RISK PREVENTION PERTAINING TO THE HOME:  Any stairs in or around the home WITH handrails? No  Home free of loose throw rugs in walkways, pet beds, electrical cords, etc? Yes  Adequate lighting in your home to reduce risk of falls? Yes   ASSISTIVE DEVICES UTILIZED TO PREVENT FALLS:  Life alert? No  Use of a cane, walker or w/c? Yes  Grab bars in the bathroom? No  Shower chair or bench in shower? No  Elevated toilet seat or a handicapped toilet? No    TIMED UP AND GO:  Was the test performed? No .    Depression Screen PHQ 2/9 Scores 07/09/2018 07/09/2018 06/06/2017 06/06/2017  PHQ - 2 Score 0 0 0 0  PHQ- 9 Score 3 - 2 2     Cognitive Function: Declined today.      6CIT Screen 05/10/2016  What Year? 0 points  What month? 0 points  What time? 0 points  Count back from 20 0 points   Months in reverse 0 points  Repeat phrase 4 points  Total Score 4    Immunization History  Administered Date(s) Administered  . Influenza, High Dose Seasonal PF 05/10/2016, 06/06/2017, 03/07/2018  . Influenza-Unspecified 04/09/2015  . Pneumococcal Conjugate-13 05/10/2016  . Pneumococcal Polysaccharide-23 03/06/2012  . Td 01/23/2003    Qualifies for Shingles Vaccine? Yes . Due for Shingrix. Education has been provided regarding the importance of this vaccine. Pt has been advised to call insurance company to determine out of pocket expense. Advised may also receive vaccine at local pharmacy or Health Dept. Verbalized acceptance and understanding.  Tdap: Although this vaccine is not a covered service during a Wellness Exam, does the patient still wish to receive this vaccine today?  No .  Education has been provided regarding the importance of this vaccine. Advised may receive this vaccine at local pharmacy or Health Dept. Aware to provide a copy of the vaccination record if obtained from local pharmacy or Health Dept. Verbalized acceptance and understanding.  Flu Vaccine: Up to date  Pneumococcal Vaccine: Up to date   Screening Tests Health Maintenance  Topic Date Due  . TETANUS/TDAP  01/22/2013  . COLONOSCOPY  03/31/2020  . MAMMOGRAM  06/19/2020  . DEXA SCAN  03/14/2021  . INFLUENZA VACCINE  Completed  . Hepatitis C Screening  Completed  . PNA vac Low Risk Adult  Completed    Cancer Screenings:  Colorectal Screening: Completed 04/01/15. Repeat every 5 years.  Mammogram: Completed 06/19/18.   Bone Density: Completed 03/14/16. Results reflect OSTEOPENIA. Repeat every 5 years.   Lung Cancer Screening: (Low Dose CT Chest recommended if Age 42-80 years, 30 pack-year currently smoking OR have quit w/in 15years.) does not qualify.    Additional Screening:  Hepatitis C Screening: Up to date  Vision Screening: Recommended  annual ophthalmology exams for early detection of  glaucoma and other disorders of the eye.  Dental Screening: Recommended annual dental exams for proper oral hygiene  Community Resource Referral:  CRR required this visit?  No       Plan:  I have personally reviewed and addressed the Medicare Annual Wellness questionnaire and have noted the following in the patient's chart:  A. Medical and social history B. Use of alcohol, tobacco or illicit drugs  C. Current medications and supplements D. Functional ability and status E.  Nutritional status F.  Physical activity G. Advance directives H. List of other physicians I.  Hospitalizations, surgeries, and ER visits in previous 12 months J.  [place] such as hearing and vision if needed, cognitive and depression L. Referrals and appointments - none  In addition, I have reviewed and discussed with patient certain preventive protocols, quality metrics, and best practice recommendations. A written personalized care plan for preventive services as well as general preventive health recommendations were provided to patient.  See attached scanned questionnaire for additional information.   Signed,  Fabio Neighbors, LPN Nurse Health Advisor   Nurse Recommendations: Pt declined the tetanus vaccine today.

## 2018-07-11 LAB — CBC WITH DIFFERENTIAL/PLATELET
Basophils Absolute: 0 10*3/uL (ref 0.0–0.2)
Basos: 0 %
EOS (ABSOLUTE): 0.3 10*3/uL (ref 0.0–0.4)
Eos: 4 %
Hematocrit: 41.5 % (ref 34.0–46.6)
Hemoglobin: 13.7 g/dL (ref 11.1–15.9)
Immature Grans (Abs): 0 10*3/uL (ref 0.0–0.1)
Immature Granulocytes: 0 %
Lymphocytes Absolute: 1.8 10*3/uL (ref 0.7–3.1)
Lymphs: 28 %
MCH: 31.6 pg (ref 26.6–33.0)
MCHC: 33 g/dL (ref 31.5–35.7)
MCV: 96 fL (ref 79–97)
Monocytes Absolute: 0.4 10*3/uL (ref 0.1–0.9)
Monocytes: 6 %
Neutrophils Absolute: 4 10*3/uL (ref 1.4–7.0)
Neutrophils: 62 %
Platelets: 251 10*3/uL (ref 150–450)
RBC: 4.34 x10E6/uL (ref 3.77–5.28)
RDW: 12.2 % (ref 11.7–15.4)
WBC: 6.5 10*3/uL (ref 3.4–10.8)

## 2018-07-11 LAB — HEMOGLOBIN A1C
Est. average glucose Bld gHb Est-mCnc: 131 mg/dL
Hgb A1c MFr Bld: 6.2 % — ABNORMAL HIGH (ref 4.8–5.6)

## 2018-07-11 LAB — LIPID PANEL WITH LDL/HDL RATIO
Cholesterol, Total: 160 mg/dL (ref 100–199)
HDL: 51 mg/dL (ref >39)
LDL Calculated: 92 mg/dL (ref 0–99)
LDl/HDL Ratio: 1.8 ratio (ref 0.0–3.2)
Triglycerides: 86 mg/dL (ref 0–149)
VLDL Cholesterol Cal: 17 mg/dL (ref 5–40)

## 2018-07-11 LAB — COMPREHENSIVE METABOLIC PANEL
ALBUMIN: 4.3 g/dL (ref 3.5–4.8)
ALK PHOS: 100 IU/L (ref 39–117)
ALT: 19 IU/L (ref 0–32)
AST: 19 IU/L (ref 0–40)
Albumin/Globulin Ratio: 1.7 (ref 1.2–2.2)
BUN / CREAT RATIO: 22 (ref 12–28)
BUN: 27 mg/dL (ref 8–27)
Bilirubin Total: 0.4 mg/dL (ref 0.0–1.2)
CO2: 22 mmol/L (ref 20–29)
Calcium: 9.4 mg/dL (ref 8.7–10.3)
Chloride: 100 mmol/L (ref 96–106)
Creatinine, Ser: 1.23 mg/dL — ABNORMAL HIGH (ref 0.57–1.00)
GFR calc Af Amer: 50 mL/min/{1.73_m2} — ABNORMAL LOW (ref >59)
GFR calc non Af Amer: 43 mL/min/{1.73_m2} — ABNORMAL LOW (ref >59)
Globulin, Total: 2.5 g/dL (ref 1.5–4.5)
Glucose: 127 mg/dL — ABNORMAL HIGH (ref 65–99)
Potassium: 4.2 mmol/L (ref 3.5–5.2)
Sodium: 139 mmol/L (ref 134–144)
Total Protein: 6.8 g/dL (ref 6.0–8.5)

## 2018-07-11 LAB — SEDIMENTATION RATE: Sed Rate: 30 mm/hr (ref 0–40)

## 2018-07-11 LAB — TSH: TSH: 4.48 u[IU]/mL (ref 0.450–4.500)

## 2018-07-16 ENCOUNTER — Telehealth: Payer: Self-pay

## 2018-07-16 NOTE — Telephone Encounter (Signed)
-----   Message from Jerrol Banana., MD sent at 07/15/2018  2:07 PM EST ----- Mild decrease in kidney function--no NSAIDs,push fluids and repeat renal panel 1-2 months.

## 2018-07-16 NOTE — Telephone Encounter (Signed)
Patient was advised and appointment schedule for 09/02/2018 @ 8:40 a.m.

## 2018-07-17 DIAGNOSIS — R011 Cardiac murmur, unspecified: Secondary | ICD-10-CM | POA: Insufficient documentation

## 2018-08-07 DIAGNOSIS — I071 Rheumatic tricuspid insufficiency: Secondary | ICD-10-CM | POA: Insufficient documentation

## 2018-08-07 DIAGNOSIS — I272 Pulmonary hypertension, unspecified: Secondary | ICD-10-CM | POA: Insufficient documentation

## 2018-08-07 DIAGNOSIS — R002 Palpitations: Secondary | ICD-10-CM | POA: Insufficient documentation

## 2018-09-02 ENCOUNTER — Ambulatory Visit (INDEPENDENT_AMBULATORY_CARE_PROVIDER_SITE_OTHER): Payer: Medicare Other | Admitting: Family Medicine

## 2018-09-02 ENCOUNTER — Encounter: Payer: Self-pay | Admitting: Family Medicine

## 2018-09-02 VITALS — BP 124/62 | HR 60 | Temp 98.5°F | Resp 16 | Ht 62.0 in | Wt 154.0 lb

## 2018-09-02 DIAGNOSIS — I10 Essential (primary) hypertension: Secondary | ICD-10-CM

## 2018-09-02 DIAGNOSIS — N189 Chronic kidney disease, unspecified: Secondary | ICD-10-CM | POA: Diagnosis not present

## 2018-09-02 DIAGNOSIS — I361 Nonrheumatic tricuspid (valve) insufficiency: Secondary | ICD-10-CM | POA: Diagnosis not present

## 2018-09-02 DIAGNOSIS — I270 Primary pulmonary hypertension: Secondary | ICD-10-CM

## 2018-09-02 NOTE — Progress Notes (Signed)
Patient: Erin Middleton Female    DOB: 1946-06-29   73 y.o.   MRN: [id number] Visit Date: 09/02/2018  Today's Provider: Wilhemena Durie, MD   Chief Complaint  Patient presents with  . Follow-up   Subjective:     HPI Patient comes in today for a follow up on lab results. On last visit, it was noted that she had a slight decline in her kidney function. She was advised to d/c all NSAIDS and increase fluids.  Patient also mentions that she was seen by Dr. Sabra Heck (ortho) a few weeks later and she was started on Meloxicam 15mg  daily.  She has been on this for about a month.  She did not realize it was a  NSAID   She was also referred to Dr. Bethanne Ginger office for new murmur.  She was found to have moderate tricuspid regurgitation and pulmonary hypertension.  She has appointment with Dr. Ubaldo Glassing later on today for follow-up.  He did mention possible sleep study .  Unable to this. No Known Allergies   Current Outpatient Medications:  .  amLODipine (NORVASC) 5 MG tablet, TAKE 1 TABLET BY MOUTH EVERY DAY IN THE EVENING, Disp: 90 tablet, Rfl: 3 .  aspirin EC 325 MG tablet, Take 1 tablet (325 mg total) by mouth 2 (two) times daily. (Patient taking differently: Take 325 mg by mouth once. ), Disp: 100 tablet, Rfl: 0 .  Cholecalciferol (VITAMIN D PO), Take by mouth 2 (two) times daily. Pt unsure of dosage. , Disp: , Rfl:  .  hydrochlorothiazide (MICROZIDE) 12.5 MG capsule, Take 1 capsule (12.5 mg total) by mouth daily., Disp: 90 capsule, Rfl: 3 .  ibuprofen (ADVIL,MOTRIN) 200 MG tablet, Take 400 mg by mouth 3 (three) times daily., Disp: , Rfl:  .  losartan (COZAAR) 100 MG tablet, Take 1 tablet (100 mg total) by mouth daily., Disp: 90 tablet, Rfl: 3 .  meloxicam (MOBIC) 15 MG tablet, Take 15 mg by mouth daily., Disp: , Rfl:  .  Multiple Vitamins-Minerals (PRESERVISION/LUTEIN) CAPS, Take by mouth 2 (two) times daily. Reported on 09/15/2015, Disp: , Rfl:  .  omeprazole (PRILOSEC) 20 MG capsule, TAKE 1  CAPSULE BY MOUTH EVERY DAY IN THE MORNING, Disp: 90 capsule, Rfl: 4 .  ranitidine (ZANTAC) 150 MG tablet, TAKE 1 TABLET (150 MG TOTAL) BY MOUTH 2 (TWO) TIMES DAILY. (Patient taking differently: Take 150 mg by mouth at bedtime. ), Disp: 180 tablet, Rfl: 2 .  vitamin E 400 UNIT capsule, Take 400 Units by mouth daily. Reported on 09/15/2015, Disp: , Rfl:   Review of Systems  Constitutional: Negative for activity change, appetite change, fatigue and fever.  HENT: Negative.   Respiratory: Negative for cough and shortness of breath.   Cardiovascular: Negative for chest pain, palpitations and leg swelling.  Gastrointestinal: Negative.   Endocrine: Negative.   Musculoskeletal: Positive for arthralgias.  Allergic/Immunologic: Negative.   Neurological: Negative for dizziness and headaches.  Psychiatric/Behavioral: Negative.     Social History   Tobacco Use  . Smoking status: Never Smoker  . Smokeless tobacco: Never Used  Substance Use Topics  . Alcohol use: No    Alcohol/week: 0.0 standard drinks      Objective:   BP 124/62 (BP Location: Right Arm, Patient Position: Sitting, Cuff Size: Normal)   Pulse 60   Temp 98.5 F (36.9 C)   Resp 16   Ht 5\' 2"  (1.575 m)   Wt 154 lb (69.9 kg)  SpO2 97%   BMI 28.17 kg/m  Vitals:   09/02/18 0843  BP: 124/62  Pulse: 60  Resp: 16  Temp: 98.5 F (36.9 C)  SpO2: 97%  Weight: 154 lb (69.9 kg)  Height: 5\' 2"  (1.575 m)     Physical Exam Vitals signs reviewed.  Constitutional:      Appearance: She is well-developed.  HENT:     Head: Normocephalic and atraumatic.  Eyes:     Conjunctiva/sclera: Conjunctivae normal.  Neck:     Thyroid: No thyromegaly.  Cardiovascular:     Rate and Rhythm: Normal rate and regular rhythm.     Heart sounds: Normal heart sounds.  Pulmonary:     Effort: Pulmonary effort is normal.     Breath sounds: Normal breath sounds.  Abdominal:     Palpations: Abdomen is soft.     Comments: No abdominal bruits.    Skin:    General: Skin is warm and dry.  Neurological:     Mental Status: She is alert and oriented to person, place, and time.  Psychiatric:        Behavior: Behavior normal.        Thought Content: Thought content normal.        Judgment: Judgment normal.         Assessment & Plan   1. Essential hypertension Controlled. - Renal function panel  2. Chronic kidney disease, unspecified CKD stage GFR decreased from about 60 to about 43.  Just control of sugar hypertension and staying hydrated.  Nephrology referral if this progresses.  Avoid all nonsteroidals. - Renal function panel  3. Nonrheumatic tricuspid valve regurgitation Per Dr. Ubaldo Glassing  4. Pulmonary hypertension, primary ([place]) Work-up per Dr. Ubaldo Glassing.  May need sleep study and pulmonary referral.  I, Rachelle L. Presley, CMA, am acting as a Education administrator for [employer]. Rosanna [name], MD.   I have done the exam and reviewed the chart and it is accurate to the best of my knowledge. Development worker, community has been used and  any errors in dictation or transcription are unintentional. Miguel Aschoff M.D. [place], MD  [facility]

## 2018-09-03 LAB — RENAL FUNCTION PANEL
Albumin: 4.1 g/dL (ref 3.7–4.7)
BUN / CREAT RATIO: 31 — AB (ref 12–28)
BUN: 37 mg/dL — ABNORMAL HIGH (ref 8–27)
CALCIUM: 9.3 mg/dL (ref 8.7–10.3)
CO2: 21 mmol/L (ref 20–29)
CREATININE: 1.18 mg/dL — AB (ref 0.57–1.00)
Chloride: 106 mmol/L (ref 96–106)
GFR calc Af Amer: 53 mL/min/{1.73_m2} — ABNORMAL LOW (ref >59)
GFR calc non Af Amer: 46 mL/min/{1.73_m2} — ABNORMAL LOW (ref >59)
Glucose: 115 mg/dL — ABNORMAL HIGH (ref 65–99)
Phosphorus: 3.7 mg/dL (ref 3.0–4.3)
Potassium: 4.4 mmol/L (ref 3.5–5.2)
Sodium: 140 mmol/L (ref 134–144)

## 2018-09-18 ENCOUNTER — Telehealth: Payer: Self-pay

## 2018-09-18 NOTE — Telephone Encounter (Signed)
LM to for COVID-19 screening.

## 2018-09-18 NOTE — Telephone Encounter (Signed)
Noted. Appointment note has been updated. Nothing further is needed.

## 2018-09-18 NOTE — Telephone Encounter (Signed)
Called patient for COVID-19 screening.  Have you recently traveled any where out of the local area in the last 2 weeks? NO  Have you been in close contact with a person diagnosed with COVID-19 within the last 2 weeks? Not that she is aware of  Do you currently have any fever, cough, or shortness of breath? No   Okay to proceed with visit.Erin Middleton

## 2018-09-19 ENCOUNTER — Encounter: Payer: Self-pay | Admitting: Pulmonary Disease

## 2018-09-19 ENCOUNTER — Ambulatory Visit
Admission: RE | Admit: 2018-09-19 | Discharge: 2018-09-19 | Disposition: A | Discharge status: Home / Self Care | Payer: Medicare Other | Source: Ambulatory Visit | Attending: Pulmonary Disease | Admitting: Pulmonary Disease

## 2018-09-19 ENCOUNTER — Other Ambulatory Visit
Admission: RE | Admit: 2018-09-19 | Discharge: 2018-09-19 | Disposition: A | Discharge status: Home / Self Care | Payer: Medicare Other | Source: Ambulatory Visit | Attending: Pulmonary Disease | Admitting: Pulmonary Disease

## 2018-09-19 ENCOUNTER — Other Ambulatory Visit: Payer: Self-pay

## 2018-09-19 ENCOUNTER — Ambulatory Visit: Payer: Medicare Other | Admitting: Pulmonary Disease

## 2018-09-19 VITALS — BP 128/78 | HR 96 | Ht 62.0 in | Wt 154.0 lb

## 2018-09-19 DIAGNOSIS — G4734 Idiopathic sleep related nonobstructive alveolar hypoventilation: Secondary | ICD-10-CM | POA: Diagnosis not present

## 2018-09-19 DIAGNOSIS — I272 Pulmonary hypertension, unspecified: Secondary | ICD-10-CM | POA: Insufficient documentation

## 2018-09-19 DIAGNOSIS — Z853 Personal history of malignant neoplasm of breast: Secondary | ICD-10-CM | POA: Diagnosis not present

## 2018-09-19 DIAGNOSIS — Z9189 Other specified personal risk factors, not elsewhere classified: Secondary | ICD-10-CM | POA: Diagnosis not present

## 2018-09-19 LAB — FIBRIN DERIVATIVES D-DIMER (ARMC ONLY): Fibrin derivatives D-dimer (ARMC): 728.95 ng/mL (FEU) — ABNORMAL HIGH (ref 0.00–499.00)

## 2018-09-19 LAB — BRAIN NATRIURETIC PEPTIDE: B Natriuretic Peptide: 81 pg/mL (ref 0.0–100.0)

## 2018-09-19 NOTE — Progress Notes (Addendum)
PULMONARY CONSULT NOTE  Requesting MD/Service: Fath. Primary MD: Erin Middleton Date of initial consultation: 09/19/18 Reason for consultation: PAH  PT PROFILE: 73 y.o. female never smoker was noted to have heart murmur on routine evaluation by Dr. Rosanna Middleton, referred to Dr. Ubaldo Glassing, echocardiogram revealed moderate pulmonary hypertension.  Referred for further evaluation  DATA: 08/01/18 echocardiogram: LVEF 55-60 %.  Normal left ventricular systolic function.  Mild LVH.  Left atrium size normal.  Mild AR, MR, PR.  Moderate-severe tricuspid regurgitation.  Moderate PAH.  RVSP estimate 59 mmHg 08/19/18 overnight oximetry: time < 89%: 11.2 mins. Lowest SpO2 74%. 12 desat events per hour  INTERVAL:  HPI:  She is referred with the above history.  She has no significant exertional dyspnea but admits to leading a relatively sedentary lifestyle.  When pressed, she indicates that she would be able to walk no more than 1/2 mile on flat ground at a leisurely pace.  She denies cough, chest pain, hemoptysis, paroxysmal nocturnal dyspnea.  She does report orthopnea.  She has intermittent left lower extremity edema over the past 3 years since the time of her left TKR.  She also reports left foot pain and paresthesias.  She denies snoring.  She believes her sleep is restful.  She denies daytime hypersomnolence and morning headache.  She has never smoked.  She is never been told that she has any underlying pulmonary conditions.  Her past medical history is most notable for breast cancer approximately 30 years ago at which time she was treated with bleomycin, cyclophosphamide, 5-FU.  Her medical records from [facility] indicate a history of rheumatoid arthritis.  However, she is not on any disease modifying medications.  She takes nonsteroidal medications intermittently.  Otherwise, PMH is notable only for hypertension and GERD.  Past Medical History:  Diagnosis Date  . Arthritis   . Breast cancer ([place]) 1985   left breast cancer - chemotherapy  . Cancer (HCC)    Breast  . GERD (gastroesophageal reflux disease)   . Headache    migraines h/o  . Hypertension   . Personal history of chemotherapy   Prenatal clinic records indicated history of rheumatoid arthritis.  Past Surgical History:  Procedure Laterality Date  . COLONOSCOPY WITH PROPOFOL N/A 04/01/2015   Procedure: COLONOSCOPY WITH PROPOFOL;  Surgeon: Hulen Luster, MD;  Location: [facility] ENDOSCOPY;  Service: Gastroenterology;  Laterality: N/A;  . DILATION AND CURETTAGE OF UTERUS    . MASTECTOMY Left   . NECK SURGERY     neck deformity-straighten out neck as a child  . PALATE / UVULA BIOPSY / EXCISION    . TOTAL KNEE ARTHROPLASTY Left 09/22/2015   Procedure: TOTAL KNEE ARTHROPLASTY;  Surgeon: Earnestine Leys, MD;  Location: [facility] ORS;  Service: Orthopedics;  Laterality: Left;    MEDICATIONS: I have reviewed all medications and confirmed regimen as documented  Social History   Socioeconomic History  . Marital status: Married    Spouse name: Not on file  . Number of children: 2  . Years of education: Not on file  . Highest education level: Some college, no degree  Occupational History  . Occupation: retired  Scientific laboratory technician  . Financial resource strain: Somewhat hard  . Food insecurity:    Worry: Never true    Inability: Never true  . Transportation needs:    Medical: No    Non-medical: No  Tobacco Use  . Smoking status: Never Smoker  . Smokeless tobacco: Never Used  Substance and Sexual Activity  .  Alcohol use: No    Alcohol/week: 0.0 standard drinks  . Drug use: No  . Sexual activity: Never  Lifestyle  . Physical activity:    Days per week: 0 days    Minutes per session: 0 min  . Stress: Not at all  Relationships  . Social connections:    Talks on phone: Patient refused    Gets together: Patient refused    Attends religious service: Patient refused    Active member of club or organization: Patient refused    Attends meetings  of clubs or organizations: Patient refused    Relationship status: Patient refused  . Intimate partner violence:    Fear of current or ex partner: Patient refused    Emotionally abused: Patient refused    Physically abused: Patient refused    Forced sexual activity: Patient refused  Other Topics Concern  . Not on file  Social History Narrative  . Not on file    Family History  Problem Relation Age of Onset  . Diabetes Mother   . Stroke Mother   . Dementia Mother   . Lung cancer Father   . Heart disease Father   . Stroke Brother   . Diabetes Brother   . Throat cancer Brother   . Diabetes Brother   . Breast cancer Neg Hx     ROS: No fever, myalgias/arthralgias, unexplained weight loss or weight gain No new focal weakness or sensory deficits No otalgia, hearing loss, visual changes, nasal and sinus symptoms, mouth and throat problems No neck pain or adenopathy No abdominal pain, N/V/D, diarrhea, change in bowel pattern No dysuria, change in urinary pattern   Vitals:   09/19/18 0927  BP: 128/78  Pulse: 96  SpO2: 97%  Weight: 154 lb (69.9 kg)  Height: 5\' 2"  (1.575 m)  Room air   EXAM:   Gen: WDWN in NAD HEENT: NCAT, sclerae white, oropharynx normal Neck: No LAN, no JVD noted Lungs: full BS, normal percussion note throughout, no adventitious sounds Cardiovascular: Regular, normal rate, no M noted Abdomen: Soft, NT, +BS Ext: no C/C/E Neuro: PERRL, EOMI, motor/sensory grossly intact Skin: No lesions noted   DATA:   BMP Latest Ref Rng & Units 09/02/2018 07/10/2018 03/07/2018  Glucose 65 - 99 mg/dL 115(H) 127(H) 142(H)  BUN 8 - 27 mg/dL 37(H) 27 23  Creatinine 0.57 - 1.00 mg/dL 1.18(H) 1.23(H) 0.95  BUN/Creat Ratio 12 - 28 31(H) 22 24  Sodium 134 - 144 mmol/L 140 139 142  Potassium 3.5 - 5.2 mmol/L 4.4 4.2 4.2  Chloride 96 - 106 mmol/L 106 100 102  CO2 20 - 29 mmol/L 21 22 23   Calcium 8.7 - 10.3 mg/dL 9.3 9.4 9.7    CBC Latest Ref Rng & Units 07/10/2018 03/07/2018  06/06/2017  WBC 3.4 - 10.8 x10E3/uL 6.5 5.8 7.5  Hemoglobin 11.1 - 15.9 g/dL 13.7 13.6 13.5  Hematocrit 34.0 - 46.6 % 41.5 40.9 38.9  Platelets 150 - 450 x10E3/uL 251 249 244    CXR: None available  I have personally reviewed all chest radiographs reported above including CXRs and CT chest unless otherwise indicated  IMPRESSION:     ICD-10-CM   1. Pulmonary hypertension (HCC) I27.20 Pulmonary Function Test [facility] Only    DG Chest 2 View    Fibrin derivatives D-Dimer ([facility] only)    Brain natriuretic peptide  2. Remote history of breast cancer treated with bleomycin, cyclophosphamide, 5-FU Z85.3   3. Sedentary lifestyle Z91.89   4. ?  Nocturnal hypoxemia G47.34    Incidental finding of pulmonary hypertension noted on echocardiogram which was performed to evaluate finding of heart murmur.  She is relatively asymptomatic though notably sedentary.  Given her history of left total knee replacement 3 years ago, my biggest concern would be CTEPH.  However, per report, and overnight oximetry was performed (ordered by Dr. Ubaldo Glassing) which was reportedly abnormal.  We will track down the results of this study and determine whether further evaluation for possible sleep apnea is warranted.  PLAN:  Initial evaluation: CXR today, BNP and d-dimer today, PFTs prior to follow-up We will track down results of overnight oximetry ordered by Dr. Ubaldo Glassing Further evaluation could include formal sleep study, VQ scan, lower extremity venous ultrasound, right heart catheterization  Follow-up in 2 weeks  ADDENDUM 03/27: ONO results noted above - I am doubtful that this is sufficient to explain the moderate-severe PAH. Nocturnal O2 ordered. Will also order V/Q scan, LE venous US to assess for CTEPH and formal PSG to assess for OSA  Merton Border, MD PCCM service Mobile [phone] Pager [phone] 09/19/2018 10:23 AM

## 2018-09-19 NOTE — Patient Instructions (Signed)
We will begin the evaluation of pulmonary hypertension with blood tests and a chest x-ray the day and PFTs (lung function test) prior to follow-up in 2 weeks.  I will track down results of the overnight oxygen measurement that was ordered by Dr. Ubaldo Glassing.  Depending on results of the above tests, we will determine further evaluation and management strategies.

## 2018-09-27 ENCOUNTER — Telehealth: Payer: Self-pay | Admitting: Pulmonary Disease

## 2018-09-27 ENCOUNTER — Telehealth: Payer: Self-pay

## 2018-09-27 DIAGNOSIS — I270 Primary pulmonary hypertension: Secondary | ICD-10-CM
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
## 2018-09-27 NOTE — Addendum Note (Signed)
Addended by: Maryanna Shape A on: 09/27/2018 03:49 PM   Modules accepted: Orders

## 2018-09-27 NOTE — Telephone Encounter (Signed)
Pt returned call CB# [phone]

## 2018-09-27 NOTE — Telephone Encounter (Signed)
-----   Message from Wilhelmina Mcardle, MD sent at 09/27/2018  1:51 PM EDT ----- 1) Please initiate nocturnal O2 @ 2 LPM. 2) schedule formal sleep study (for whenever it can be done) 3) order V/Q scan (Dx: pulmonary hypertension, rule out chronic pulmonary embolism) 4) order LE venous ultrasound (rule out chronic DVT)  Try to get V/Q and venous US done prior to her follow up with me.   Thanks  Waunita Schooner

## 2018-09-27 NOTE — Telephone Encounter (Signed)
Pt is aware of below recommendations and voiced her understanding.  Pt wishes to discuss sleep study and night time oxygen further at her upcoming appointment.  Order has been placed for V/Q scan and venous doppler. Nothing further is needed at this time.

## 2018-09-27 NOTE — Telephone Encounter (Signed)
ONO reviewed by Dr. Alva Garnet. Recommend starting 2L [place] QHS. Lowest spo2 74% Spoke to pt's spouse, who asked that I contact pt via cell phone.  LMTCB on mobile number on file.

## 2018-10-02 ENCOUNTER — Ambulatory Visit (INDEPENDENT_AMBULATORY_CARE_PROVIDER_SITE_OTHER): Payer: Medicare Other

## 2018-10-02 DIAGNOSIS — I270 Primary pulmonary hypertension: Secondary | ICD-10-CM | POA: Diagnosis not present

## 2018-10-03 ENCOUNTER — Encounter
Admission: RE | Admit: 2018-10-03 | Discharge: 2018-10-03 | Disposition: A | Discharge status: Home / Self Care | Payer: Medicare Other | Source: Ambulatory Visit | Attending: Pulmonary Disease | Admitting: Pulmonary Disease

## 2018-10-03 ENCOUNTER — Ambulatory Visit (HOSPITAL_COMMUNITY): Payer: Medicare Other

## 2018-10-03 ENCOUNTER — Other Ambulatory Visit: Payer: Self-pay

## 2018-10-03 ENCOUNTER — Ambulatory Visit
Admission: RE | Admit: 2018-10-03 | Discharge: 2018-10-03 | Disposition: A | Discharge status: Home / Self Care | Payer: Medicare Other | Source: Ambulatory Visit | Attending: Pulmonary Disease | Admitting: Pulmonary Disease

## 2018-10-03 DIAGNOSIS — I272 Pulmonary hypertension, unspecified: Secondary | ICD-10-CM | POA: Insufficient documentation

## 2018-10-03 DIAGNOSIS — I270 Primary pulmonary hypertension: Secondary | ICD-10-CM | POA: Insufficient documentation

## 2018-10-03 MED ORDER — TECHNETIUM TO 99M ALBUMIN AGGREGATED
4.0000 | Freq: Once | INTRAVENOUS | Status: AC | PRN
  Administered 2018-10-03: 4.33 via INTRAVENOUS

## 2018-10-03 MED ORDER — TECHNETIUM TC 99M DIETHYLENETRIAME-PENTAACETIC ACID
32.5100 | Freq: Once | INTRAVENOUS | Status: AC | PRN
  Administered 2018-10-03: 32.51 via RESPIRATORY_TRACT

## 2018-10-07 ENCOUNTER — Ambulatory Visit: Payer: Medicare Other | Admitting: Pulmonary Disease

## 2018-10-21 ENCOUNTER — Other Ambulatory Visit: Payer: Self-pay

## 2018-10-21 ENCOUNTER — Ambulatory Visit (INDEPENDENT_AMBULATORY_CARE_PROVIDER_SITE_OTHER): Payer: Medicare Other | Admitting: Pulmonary Disease

## 2018-10-21 ENCOUNTER — Encounter: Payer: Self-pay | Admitting: Pulmonary Disease

## 2018-10-21 VITALS — BP 142/80 | HR 74 | Ht 62.5 in | Wt 152.0 lb

## 2018-10-21 DIAGNOSIS — G4734 Idiopathic sleep related nonobstructive alveolar hypoventilation: Secondary | ICD-10-CM

## 2018-10-21 DIAGNOSIS — I272 Pulmonary hypertension, unspecified: Secondary | ICD-10-CM | POA: Diagnosis not present

## 2018-10-21 NOTE — Patient Instructions (Addendum)
Begin nocturnal oxygen @ 2 LPM Home sleep study to be performed when possible Repeat echocardiogram end of July (6 months from first echocardiogram) Follow up in 4 months. Call sooner if needed

## 2018-10-21 NOTE — Progress Notes (Signed)
PULMONARY OFFICE FOLLOW UP NOTE  Requesting MD/Service: Fath. Primary MD: Rosanna [name] Date of initial consultation: 09/19/18 Reason for consultation: PAH  PT PROFILE: 73 y.o. female never smoker was noted to have heart murmur on routine evaluation by Dr. Rosanna [name], referred to Dr. Ubaldo Glassing, echocardiogram revealed moderate pulmonary hypertension.  Referred for further evaluation  DATA: 08/01/18 echocardiogram: LVEF 55-60 %.  Normal left ventricular systolic function.  Mild LVH.  Left atrium size normal.  Mild AR, MR, PR.  Moderate-severe tricuspid regurgitation.  Moderate PAH.  RVSP estimate 59 mmHg 08/19/18 overnight oximetry: time < 89%: 11.2 mins. Lowest SpO2 74%. 12 desat events per hour 09/19/18: BNP 81. D-dimer 729 10/03/18 V/Q scan: Patchy matching bilateral nonsegmental ventilation and perfusion defects, possibly related to obstructive lung disease. Low probability study for pulmonary embolus 10/03/18 BLE venous US: No DVT noted in either lower extremity 10/03/18: Normal spirometry, normal lung volumes, DLCO 11.98 (62% predicted).  DLCO/VA 76% predicted  INTERVAL:  SUBJ:  This is a scheduled follow-up.  She returns today to discuss results of the above tests.  After tracking down the results of the overnight oximetry, I recommended that we initiate nocturnal oxygen and obtain a sleep study.  She opted to forgo those until she was able to discuss further with me.  Presently, she has no new complaints.  She denies exertional dyspnea.  She remains sedentary.  She denies CP, fever, purulent sputum, hemoptysis, LE edema and calf tenderness.  She continues to note palpitations when she lies down to sleep at night.  Vitals:   10/21/18 0952 10/21/18 0958  BP:  (!) 142/80  Pulse:  74  SpO2:  97%  Weight: 152 lb (68.9 kg)   Height: 5' 2.5" (1.588 m)   Room air   EXAM:  Gen: NAD HEENT: NCAT, sclerae white Neck: No JVD Lungs: breath sounds full, no wheezes or other adventitious  sounds Cardiovascular: RRR, no murmurs Abdomen: Soft, nontender, normal BS Ext: without clubbing, cyanosis, edema Neuro: grossly intact Skin: Limited exam, no lesions noted    DATA:   BMP Latest Ref Rng & Units 09/02/2018 07/10/2018 03/07/2018  Glucose 65 - 99 mg/dL 115(H) 127(H) 142(H)  BUN 8 - 27 mg/dL 37(H) 27 23  Creatinine 0.57 - 1.00 mg/dL 1.18(H) 1.23(H) 0.95  BUN/Creat Ratio 12 - 28 31(H) 22 24  Sodium 134 - 144 mmol/L 140 139 142  Potassium 3.5 - 5.2 mmol/L 4.4 4.2 4.2  Chloride 96 - 106 mmol/L 106 100 102  CO2 20 - 29 mmol/L 21 22 23   Calcium 8.7 - 10.3 mg/dL 9.3 9.4 9.7    CBC Latest Ref Rng & Units 07/10/2018 03/07/2018 06/06/2017  WBC 3.4 - 10.8 x10E3/uL 6.5 5.8 7.5  Hemoglobin 11.1 - 15.9 g/dL 13.7 13.6 13.5  Hematocrit 34.0 - 46.6 % 41.5 40.9 38.9  Platelets 150 - 450 x10E3/uL 251 249 244    CXR 04/02: Moderate kyphoscoliosis.  Mild, chronic-appearing fibrotic changes in left base.  No acute findings.  I have personally reviewed all chest radiographs reported above including CXRs and CT chest unless otherwise indicated  IMPRESSION:     ICD-10-CM   1. Pulmonary hypertension, presently unclassified I27.20 Home sleep test    ECHOCARDIOGRAM COMPLETE    AMB REFERRAL FOR DME    CANCELED: AMB REFERRAL FOR DME  2. Nocturnal hypoxemia G47.34 Home sleep test    AMB REFERRAL FOR DME    CANCELED: AMB REFERRAL FOR DME   Pulmonary hypertension was incidentally found on echocardiogram which  was performed to evaluate finding of heart murmur.  She remains relatively asymptomatic though notably sedentary.  It is unclear which group this would fall under.  The only abnormal finding is nocturnal hypoxemia.  She has no symptoms of daytime sleepiness and denies heavy snoring.  We discussed the importance of the finding of pulmonary hypertension.  We discussed the implications of this.  I expressed my concern that this could be a progressive process and, although asymptomatic presently,  could progress to the point of significant symptoms and limited potential for treatment impact.  PLAN:  I again recommended that we initiate nocturnal oxygen at 2 LPM by [place] I have ordered a home sleep study to be performed when possible If sleep study demonstrates significant sleep apnea, we will consider initiating CPAP therapy A repeat echocardiogram will be performed at the end of July (6 months from first echocardiogram) She will follow-up in 4 months after echocardiogram has been completed.  She knows to call sooner as needed for any increased respiratory symptoms.   Merton Border, MD PCCM service Mobile [phone] Pager [phone] 10/21/2018 10:22 AM

## 2018-11-07 ENCOUNTER — Ambulatory Visit: Payer: Medicare Other

## 2018-11-07 ENCOUNTER — Other Ambulatory Visit: Payer: Self-pay

## 2018-11-07 DIAGNOSIS — I272 Pulmonary hypertension, unspecified: Secondary | ICD-10-CM

## 2018-11-07 DIAGNOSIS — G4734 Idiopathic sleep related nonobstructive alveolar hypoventilation: Secondary | ICD-10-CM

## 2018-11-11 DIAGNOSIS — G4733 Obstructive sleep apnea (adult) (pediatric): Secondary | ICD-10-CM | POA: Diagnosis not present

## 2018-11-12 DIAGNOSIS — G4733 Obstructive sleep apnea (adult) (pediatric): Secondary | ICD-10-CM | POA: Diagnosis not present

## 2018-11-13 ENCOUNTER — Telehealth: Payer: Self-pay | Admitting: Pulmonary Disease

## 2018-11-13 DIAGNOSIS — G4733 Obstructive sleep apnea (adult) (pediatric): Secondary | ICD-10-CM

## 2018-11-13 NOTE — Telephone Encounter (Signed)
HST preformed on 11/07/2018 confirmed moderate OSA with AHI of 23. Recommend auto cpap 5-20cm h2O.  Pt is aware of results and voiced her understanding. Pt stated that she would like to think about therapy, and will give our office a call back when she wished to proceed.

## 2018-11-15 NOTE — Telephone Encounter (Signed)
Pt called and back and requested to proceed with cpap. Order has been placed. Pt has been scheduled for f/u in 01/17/2019. Pt is questioning if she should continue 2L QHS?  DS please advise. Thanks

## 2018-11-22 ENCOUNTER — Other Ambulatory Visit: Payer: Self-pay | Admitting: Family Medicine

## 2018-11-26 ENCOUNTER — Telehealth: Payer: Self-pay | Admitting: Pulmonary Disease

## 2018-11-26 DIAGNOSIS — G4734 Idiopathic sleep related nonobstructive alveolar hypoventilation: Secondary | ICD-10-CM

## 2018-11-26 NOTE — Telephone Encounter (Signed)
Called [facility] and spoke with Crystal. Order has been received and the intake department had sent a message back about the HST. Crystal to review the HST and contact us back with response. Rhonda J Cobb

## 2018-11-26 NOTE — Telephone Encounter (Signed)
Suanne Marker spoke to [place], please see previous phone note.

## 2018-11-26 NOTE — Telephone Encounter (Signed)
Called and spoke to pt, and relayed below message/recommendations.  Pt stated that she has not been upset on cpap as of yet, as pt has not received contract via email from [place].   Suanne Marker, can you help with this?

## 2018-11-26 NOTE — Telephone Encounter (Signed)
Per Crystal at [facility]. Order has been processed. Crystal ATC patient on both phone numbers and left a message on both numbers for pt to return call to schedule CPAP Set Up. Nothing else needed at this time. Rhonda J Cobb

## 2018-11-26 NOTE — Telephone Encounter (Signed)
Per Dr. Alva Garnet, patient is to just wear CPAP, with no O2.   Erin Middleton for patient. Once she starts CPAP therapy to let us know. CPAP order was received 11/19/18 by Huey Romans.

## 2019-01-04 ENCOUNTER — Other Ambulatory Visit: Payer: Self-pay | Admitting: Family Medicine

## 2019-01-04 DIAGNOSIS — I10 Essential (primary) hypertension: Secondary | ICD-10-CM

## 2019-01-15 ENCOUNTER — Telehealth: Payer: Self-pay | Admitting: Pulmonary Disease

## 2019-01-15 NOTE — Telephone Encounter (Signed)
Called patient for COVID-19 pre-screening for in office visit.  Have you recently traveled any where out of the local area in the last 2 weeks? no  Have you been in close contact with a person diagnosed with COVID-19 or someone awaiting results within the last 2 weeks? no  Do you currently have any of the following symptoms? If so, when did they start? Cough     Diarrhea   Joint Pain Fever      Muscle Pain   Red eyes Shortness of breath-baseline present x6-53mo   Abdominal pain  Vomiting Loss of smell    Rash    Sore Throat Headache    Weakness   Bruising or bleeding   Okay to proceed with visit. (date)

## 2019-01-17 ENCOUNTER — Other Ambulatory Visit: Payer: Self-pay

## 2019-01-17 ENCOUNTER — Ambulatory Visit: Payer: Medicare Other | Admitting: Pulmonary Disease

## 2019-01-17 ENCOUNTER — Encounter: Payer: Self-pay | Admitting: Pulmonary Disease

## 2019-01-17 VITALS — BP 140/80 | HR 69 | Temp 97.4°F | Ht 63.0 in | Wt 168.6 lb

## 2019-01-17 DIAGNOSIS — I272 Pulmonary hypertension, unspecified: Secondary | ICD-10-CM

## 2019-01-17 DIAGNOSIS — G4733 Obstructive sleep apnea (adult) (pediatric): Secondary | ICD-10-CM | POA: Diagnosis not present

## 2019-01-17 NOTE — Patient Instructions (Signed)
Continue CPAP with sleep Follow up echocardiogram as scheduled Follow up 6 months

## 2019-01-17 NOTE — Progress Notes (Signed)
PULMONARY OFFICE FOLLOW UP NOTE  Requesting MD/Service: Fath. Primary MD: Rosanna [name] Date of initial consultation: 09/19/18 Reason for consultation: PAH  PT PROFILE: 73 y.o. female never smoker was noted to have heart murmur on routine evaluation by Dr. Rosanna [name], referred to Dr. Ubaldo Glassing, echocardiogram revealed moderate pulmonary hypertension.  Referred for further evaluation  DATA: 08/01/18 echocardiogram: LVEF 55-60 %.  Normal left ventricular systolic function.  Mild LVH.  Left atrium size normal.  Mild AR, MR, PR.  Moderate-severe tricuspid regurgitation.  Moderate PAH.  RVSP estimate 59 mmHg 08/19/18 overnight oximetry: time < 89%: 11.2 mins. Lowest SpO2 74%. 12 desat events per hour 09/19/18: BNP 81. D-dimer 729 10/03/18 V/Q scan: Patchy matching bilateral nonsegmental ventilation and perfusion defects, possibly related to obstructive lung disease. Low probability study for pulmonary embolus 10/03/18 BLE venous US: No DVT noted in either lower extremity 10/03/18: Normal spirometry, normal lung volumes, DLCO 11.98 (62% predicted).  DLCO/VA 76% predicted 11/07/18 Home sleep study: Moderate OSA. AHI 23/hr. Recommended CPAP AutoSet 5-20 cm H2O 06/15-07/14/20 CPAP compliance: 25/30 nights. Median pressure 10.8 cm H2O.  AHI 0.8 events/hour.  INTERVAL: Since last visit 10/21/2018 she has been diagnosed with sleep apnea and started on CPAP.  SUBJ:  She is having some difficulty getting used to the CPAP.  She is trying to wear it most nights.  She still feels fatigued but not necessarily sleepy.  She is using a nasal mask.  Her compliance report above has been reviewed in detail by me.  A repeat echocardiogram to reassess pulmonary artery pressures has been ordered by Dr. Ubaldo Glassing.  She denies significant exertional dyspnea, chest pain, sputum production, hemoptysis, lower extremity edema, calf tenderness.  Vitals:   01/17/19 0849  BP: 140/80  Pulse: 69  Temp: (!) 97.4 F (36.3 C)  TempSrc: Skin   SpO2: 95%  Weight: 168 lb 9.6 oz (76.5 kg)  Height: 5\' 3"  (1.6 m)  Room air   EXAM:  Gen: NAD HEENT: NCAT, sclerae white Neck: No JVD Lungs: breath sounds full, no wheezes or other adventitious sounds Cardiovascular: RRR, no murmurs Abdomen: Soft, nontender, normal BS Ext: without clubbing, cyanosis, edema Neuro: grossly intact Skin: Limited exam, no lesions noted   DATA:   BMP Latest Ref Rng & Units 09/02/2018 07/10/2018 03/07/2018  Glucose 65 - 99 mg/dL 115(H) 127(H) 142(H)  BUN 8 - 27 mg/dL 37(H) 27 23  Creatinine 0.57 - 1.00 mg/dL 1.18(H) 1.23(H) 0.95  BUN/Creat Ratio 12 - 28 31(H) 22 24  Sodium 134 - 144 mmol/L 140 139 142  Potassium 3.5 - 5.2 mmol/L 4.4 4.2 4.2  Chloride 96 - 106 mmol/L 106 100 102  CO2 20 - 29 mmol/L 21 22 23   Calcium 8.7 - 10.3 mg/dL 9.3 9.4 9.7    CBC Latest Ref Rng & Units 07/10/2018 03/07/2018 06/06/2017  WBC 3.4 - 10.8 x10E3/uL 6.5 5.8 7.5  Hemoglobin 11.1 - 15.9 g/dL 13.7 13.6 13.5  Hematocrit 34.0 - 46.6 % 41.5 40.9 38.9  Platelets 150 - 450 x10E3/uL 251 249 244    CXR: No new film  I have personally reviewed all chest radiographs reported above including CXRs and CT chest unless otherwise indicated  IMPRESSION:     ICD-10-CM   1. Moderate OSA on CPAP  G47.33   2. Pulmonary hypertension, likely group 3 (due to OSA)  I27.20      PLAN:  Continue CPAP with sleep Follow-up echocardiogram as scheduled Follow-up in this office in 6 months.  Call sooner if  needed   Merton Border, MD PCCM service Mobile [phone] Pager [phone] 01/23/2019 4:27 PM

## 2019-01-28 ENCOUNTER — Ambulatory Visit: Payer: Medicare Other

## 2019-04-15 ENCOUNTER — Ambulatory Visit (INDEPENDENT_AMBULATORY_CARE_PROVIDER_SITE_OTHER): Payer: Medicare Other

## 2019-04-15 ENCOUNTER — Other Ambulatory Visit: Payer: Self-pay

## 2019-04-15 DIAGNOSIS — Z23 Encounter for immunization: Secondary | ICD-10-CM

## 2019-04-30 ENCOUNTER — Other Ambulatory Visit: Payer: Self-pay | Admitting: Family Medicine

## 2019-04-30 DIAGNOSIS — I10 Essential (primary) hypertension: Secondary | ICD-10-CM

## 2019-05-06 ENCOUNTER — Other Ambulatory Visit: Payer: Self-pay | Admitting: Family Medicine

## 2019-05-06 DIAGNOSIS — Z1231 Encounter for screening mammogram for malignant neoplasm of breast: Secondary | ICD-10-CM

## 2019-06-21 ENCOUNTER — Other Ambulatory Visit: Payer: Self-pay | Admitting: Family Medicine

## 2019-06-22 NOTE — Telephone Encounter (Signed)
Requested medication (s) are due for refill today: yes  Requested medication (s) are on the active medication list: yes  Last refill:  03/08/2019  Future visit scheduled:yes  Notes to clinic: review for refill Overdue for office visit    Requested Prescriptions  Pending Prescriptions Disp Refills   hydrochlorothiazide (MICROZIDE) 12.5 MG capsule [Pharmacy Med Name: HYDROCHLOROTHIAZIDE 12.5 MG CP] 90 capsule 3    Sig: TAKE 1 CAPSULE BY MOUTH EVERY DAY      Cardiovascular: Diuretics - Thiazide Failed - 06/21/2019 10:43 AM      Failed - Cr in normal range and within 360 days    Creat  Date Value Ref Range Status  06/06/2017 0.94 (H) 0.60 - 0.93 mg/dL Final    Comment:    For patients >35 years of age, the reference limit for Creatinine is approximately 13% higher for people identified as African-American. .    Creatinine, Ser  Date Value Ref Range Status  09/02/2018 1.18 (H) 0.57 - 1.00 mg/dL Final          Failed - Last BP in normal range    BP Readings from Last 1 Encounters:  01/17/19 140/80          Failed - Valid encounter within last 6 months    Recent Outpatient Visits           9 months ago Essential hypertension   [facility] Jerrol Banana., MD   11 months ago Annual physical exam   [facility] Jerrol Banana., MD   1 year ago Essential hypertension   [facility] Jerrol Banana., MD   1 year ago Essential hypertension   [facility] Jerrol Banana., MD   2 years ago Annual physical exam   [facility] Jerrol Banana., MD       Future Appointments             In 3 weeks  [facility] [place]   In 3 weeks Jerrol Banana., MD [facility]            Passed - Ca in normal range and within 360 days    Calcium  Date Value Ref Range Status  09/02/2018 9.3 8.7 - 10.3 mg/dL Final   Calcium, Ion  Date  Value Ref Range Status  02/10/2016 5.5 4.5 - 5.6 mg/dL Final          Passed - K in normal range and within 360 days    Potassium  Date Value Ref Range Status  09/02/2018 4.4 3.5 - 5.2 mmol/L Final          Passed - Na in normal range and within 360 days    Sodium  Date Value Ref Range Status  09/02/2018 140 134 - 144 mmol/L Final

## 2019-06-23 ENCOUNTER — Ambulatory Visit
Admission: RE | Admit: 2019-06-23 | Discharge: 2019-06-23 | Disposition: A | Discharge status: Home / Self Care | Payer: Medicare Other | Source: Ambulatory Visit | Attending: Family Medicine | Admitting: Family Medicine

## 2019-06-23 DIAGNOSIS — Z1231 Encounter for screening mammogram for malignant neoplasm of breast: Secondary | ICD-10-CM | POA: Diagnosis not present

## 2019-06-24 ENCOUNTER — Other Ambulatory Visit: Payer: Self-pay | Admitting: Family Medicine

## 2019-06-24 DIAGNOSIS — K219 Gastro-esophageal reflux disease without esophagitis: Secondary | ICD-10-CM

## 2019-07-10 NOTE — Progress Notes (Signed)
Subjective:   Erin Middleton is a 74 y.o. female who presents for Medicare Annual (Subsequent) preventive examination.    This visit is being conducted through telemedicine due to the COVID-19 pandemic. This patient has given me verbal consent via doximity to conduct this visit, patient states they are participating from their home address. Some vital signs may be absent or patient reported.    Patient identification: identified by name, DOB, and current address  Review of Systems:  N/A  Cardiac Risk Factors include: advanced age (>86men, >17 women);hypertension     Objective:     Vitals: There were no vitals taken for this visit.  There is no height or weight on file to calculate BMI. Unable to obtain vitals due to visit being conducted via telephonically.   Advanced Directives 07/14/2019 07/09/2018 06/06/2017 05/10/2016 09/22/2015 08/30/2015 02/23/2015  Does Patient Have a Medical Advance Directive? Yes Yes Yes Yes Yes Yes Yes  Type of Paramedic of [place];Living will [place];Living will [place];Living will - Living will;Healthcare Power of [place];Living will Living will  Does patient want to make changes to medical advance directive? - - - - No - Patient declined - -  Copy of [place] in Chart? No - copy requested No - copy requested No - copy requested - No - copy requested - -    Tobacco Social History   Tobacco Use  Smoking Status Never Smoker  Smokeless Tobacco Never Used     Counseling given: Not Answered   Clinical Intake:  Pre-visit preparation completed: Yes  Pain : No/denies pain Pain Score: 0-No pain     Nutritional Risks: None Diabetes: No  How often do you need to have someone help you when you read instructions, pamphlets, or other written materials from your doctor or pharmacy?: 1 - Never  Interpreter Needed?: No  Information entered  by :: [facility], LPN  Past Medical History:  Diagnosis Date  . Arthritis   . Breast cancer ([place]) 1985   left breast cancer - chemotherapy  . Cancer (HCC)    Breast  . GERD (gastroesophageal reflux disease)   . Headache    migraines h/o  . Hypertension   . Personal history of chemotherapy    Past Surgical History:  Procedure Laterality Date  . COLONOSCOPY WITH PROPOFOL N/A 04/01/2015   Procedure: COLONOSCOPY WITH PROPOFOL;  Surgeon: Hulen Luster, MD;  Location: [facility] ENDOSCOPY;  Service: Gastroenterology;  Laterality: N/A;  . DILATION AND CURETTAGE OF UTERUS    . MASTECTOMY Left   . NECK SURGERY     neck deformity-straighten out neck as a child  . PALATE / UVULA BIOPSY / EXCISION    . TOTAL KNEE ARTHROPLASTY Left 09/22/2015   Procedure: TOTAL KNEE ARTHROPLASTY;  Surgeon: Earnestine Leys, MD;  Location: [facility] ORS;  Service: Orthopedics;  Laterality: Left;  . US CAROTID DOPPLER BILATERAL ([facility] HX)     Family History  Problem Relation Age of Onset  . Diabetes Mother   . Stroke Mother   . Dementia Mother   . Lung cancer Father   . Heart disease Father   . Stroke Brother   . Diabetes Brother   . Throat cancer Brother   . Diabetes Brother   . Breast cancer Neg Hx    Social History   Socioeconomic History  . Marital status: Married    Spouse name: Not on file  . Number  of children: 2  . Years of education: Not on file  . Highest education level: Some college, no degree  Occupational History  . Occupation: retired  Tobacco Use  . Smoking status: Never Smoker  . Smokeless tobacco: Never Used  Substance and Sexual Activity  . Alcohol use: No    Alcohol/week: 0.0 standard drinks  . Drug use: No  . Sexual activity: Never  Other Topics Concern  . Not on file  Social History Narrative  . Not on file   Social Determinants of Health   Financial Resource Strain: Low Risk   . Difficulty of Paying Living Expenses: Not hard at all  Food Insecurity: No Food Insecurity  . Worried  About Charity fundraiser in the Last Year: Never true  . Ran Out of Food in the Last Year: Never true  Transportation Needs: No Transportation Needs  . Lack of Transportation (Medical): No  . Lack of Transportation (Non-Medical): No  Physical Activity: Inactive  . Days of Exercise per Week: 0 days  . Minutes of Exercise per Session: 0 min  Stress: No Stress Concern Present  . Feeling of Stress : Not at all  Social Connections: Not Isolated  . Frequency of Communication with Friends and Family: More than three times a week  . Frequency of Social Gatherings with Friends and Family: Twice a week  . Attends Religious Services: More than 4 times per year  . Active Member of Clubs or Organizations: Yes  . Attends Archivist Meetings: More than 4 times per year  . Marital Status: Married    Outpatient Encounter Medications as of 07/14/2019  Medication Sig  . amLODipine (NORVASC) 5 MG tablet TAKE 1 TABLET BY MOUTH EVERY DAY IN THE EVENING  . Cholecalciferol (VITAMIN D PO) Take by mouth 2 (two) times daily. Pt unsure of dosage.   . hydrochlorothiazide (MICROZIDE) 12.5 MG capsule TAKE 1 CAPSULE BY MOUTH EVERY DAY  . Multiple Vitamins-Minerals (PRESERVISION/LUTEIN) CAPS Take by mouth 2 (two) times daily. Reported on 09/15/2015  . omeprazole (PRILOSEC) 20 MG capsule TAKE 1 CAPSULE BY MOUTH EVERY DAY IN THE MORNING  . telmisartan (MICARDIS) 80 MG tablet Please specify directions, refills and quantity (Patient taking differently: Take 80 mg by mouth daily. )  . vitamin E 400 UNIT capsule Take 400 Units by mouth daily. Reported on 09/15/2015  . ranitidine (ZANTAC) 150 MG tablet TAKE 1 TABLET (150 MG TOTAL) BY MOUTH 2 (TWO) TIMES DAILY. (Patient not taking: Reported on 07/14/2019)   No facility-administered encounter medications on file as of 07/14/2019.    Activities of Daily Living In your present state of health, do you have any difficulty performing the following activities: 07/14/2019   Hearing? N  Vision? Y  Comment Due to blurred vision in the right eye. Apt to evaluate is scheduled.  Difficulty concentrating or making decisions? N  Walking or climbing stairs? Y  Comment Due to back pain and tiredness.  Dressing or bathing? N  Doing errands, shopping? N  Preparing Food and eating ? N  Using the Toilet? N  In the past six months, have you accidently leaked urine? N  Do you have problems with loss of bowel control? N  Managing your Medications? N  Managing your Finances? N  Housekeeping or managing your Housekeeping? N  Some recent data might be hidden    Patient Care Team: Jerrol Banana., MD as PCP - General (Family Medicine) Earnestine Leys, MD as Consulting Physician (  Specialist) Teodoro Spray, MD as Consulting Physician (Cardiology) Rhea Bleacher as Physician Assistant (Gastroenterology) Magdalen Spatz, NP as Nurse Practitioner (Pulmonary Disease)    Assessment:   This is a routine wellness examination for Safiyah.  Exercise Activities and Dietary recommendations Current Exercise Habits: The patient does not participate in regular exercise at present, Exercise limited by: None identified  Goals    . Exercise 3x per week (30 min per time)     Recommend to start walking at least 3 times a week for 30 minutes at a time.       Fall Risk: Fall Risk  07/14/2019 07/09/2018 06/06/2017 05/10/2016 02/23/2015  Falls in the past year? 0 1 No No Yes  Number falls in past yr: 0 1 - - 1  Injury with Fall? 0 0 - - No  Risk for fall due to : - Impaired balance/gait - - -  Follow up - Falls prevention discussed - - -    FALL RISK PREVENTION PERTAINING TO THE HOME:  Any stairs in or around the home? Yes  If so, are there any without handrails? No   Home free of loose throw rugs in walkways, pet beds, electrical cords, etc? Yes  Adequate lighting in your home to reduce risk of falls? Yes   ASSISTIVE DEVICES UTILIZED TO PREVENT FALLS:  Life  alert? No  Use of a cane, walker or w/c? Yes  Grab bars in the bathroom? No  Shower chair or bench in shower? Yes  Elevated toilet seat or a handicapped toilet? Yes    TIMED UP AND GO:  Was the test performed? No .    Depression Screen PHQ 2/9 Scores 07/14/2019 07/14/2019 07/09/2018 07/09/2018  PHQ - 2 Score 0 0 0 0  PHQ- 9 Score - - 3 -     Cognitive Function: Declined today.      6CIT Screen 05/10/2016  What Year? 0 points  What month? 0 points  What time? 0 points  Count back from 20 0 points  Months in reverse 0 points  Repeat phrase 4 points  Total Score 4    Immunization History  Administered Date(s) Administered  . Fluad Quad(high Dose 65+) 04/15/2019  . Influenza, High Dose Seasonal PF 05/10/2016, 06/06/2017, 03/07/2018  . Influenza-Unspecified 04/09/2015  . Pneumococcal Conjugate-13 05/10/2016  . Pneumococcal Polysaccharide-23 03/06/2012  . Td 01/23/2003    Qualifies for Shingles Vaccine? Yes . Due for Shingrix. Pt has been advised to call insurance company to determine out of pocket expense. Advised may also receive vaccine at local pharmacy or Health Dept. Verbalized acceptance and understanding.  Tdap: Although this vaccine is not a covered service during a Wellness Exam, does the patient still wish to receive this vaccine today?  No . Advised may receive this vaccine at local pharmacy or Health Dept. Aware to provide a copy of the vaccination record if obtained from local pharmacy or Health Dept. Verbalized acceptance and understanding.  Flu Vaccine: Up to date  Pneumococcal Vaccine: Completed series  Screening Tests Health Maintenance  Topic Date Due  . TETANUS/TDAP  07/13/2020 (Originally 01/22/2013)  . COLONOSCOPY  03/31/2020  . DEXA SCAN  03/14/2021  . INFLUENZA VACCINE  Completed  . Hepatitis C Screening  Completed  . PNA vac Low Risk Adult  Completed    Cancer Screenings:  Colorectal Screening: Completed 04/01/15. Repeat every 5 years.    Mammogram: Completed 06/23/19. Repeat every 1-2 years as advised.   Bone Density:  Completed 03/14/16. Results reflect OSTEOPENIA. Repeat every 5 years.   Lung Cancer Screening: (Low Dose CT Chest recommended if Age 71-80 years, 30 pack-year currently smoking OR have quit w/in 15years.) does not qualify.   Additional Screening:  Hepatitis C Screening: Up to date  Vision Screening: Recommended annual ophthalmology exams for early detection of glaucoma and other disorders of the eye.  Dental Screening: Recommended annual dental exams for proper oral hygiene  Community Resource Referral:  CRR required this visit?  No       Plan:  I have personally reviewed and addressed the Medicare Annual Wellness questionnaire and have noted the following in the patient's chart:  A. Medical and social history B. Use of alcohol, tobacco or illicit drugs  C. Current medications and supplements D. Functional ability and status E.  Nutritional status F.  Physical activity G. Advance directives H. List of other physicians I.  Hospitalizations, surgeries, and ER visits in previous 12 months J.  Rudy such as hearing and vision if needed, cognitive and depression L. Referrals and appointments   In addition, I have reviewed and discussed with patient certain preventive protocols, quality metrics, and best practice recommendations. A written personalized care plan for preventive services as well as general preventive health recommendations were provided to patient. Nurse Health Advisor  Signed,    Cherron Blitzer [place], [place]  [id number] Nurse Health Advisor   Nurse Notes: None.

## 2019-07-14 ENCOUNTER — Ambulatory Visit (INDEPENDENT_AMBULATORY_CARE_PROVIDER_SITE_OTHER): Payer: Medicare PPO

## 2019-07-14 ENCOUNTER — Other Ambulatory Visit: Payer: Self-pay

## 2019-07-14 DIAGNOSIS — Z0000 Encounter for general adult medical examination without abnormal findings: Secondary | ICD-10-CM

## 2019-07-14 NOTE — Patient Instructions (Signed)
Erin Middleton , Thank you for taking time to come for your Medicare Wellness Visit. I appreciate your ongoing commitment to your health goals. Please review the following plan we discussed and let me know if I can assist you in the future.   Screening recommendations/referrals: Colonoscopy: Up to date, due 03/2020 Mammogram: Up to date, due 06/2021 Bone Density: Up to date, due 03/2021 Recommended yearly ophthalmology/optometry visit for glaucoma screening and checkup Recommended yearly dental visit for hygiene and checkup  Vaccinations: Influenza vaccine: Up to date Pneumococcal vaccine: Completed series Tdap vaccine: Pt declines today.  Shingles vaccine: Pt declines today.     Advanced directives: Please bring a copy of your POA (Power of Attorney) and/or Living Will to your next appointment.   Conditions/risks identified: Recommend to start some form of exercise 3 days a week for 30 minutes at a time.   Next appointment: 07/30/19 @ 2:20 PM with Dr Rosanna [name]. Declined scheduling an AWV for 2022 at this time.    Preventive Care 76 Years and Older, Female Preventive care refers to lifestyle choices and visits with your health care provider that can promote health and wellness. What does preventive care include?  A yearly physical exam. This is also called an annual well check.  Dental exams once or twice a year.  Routine eye exams. Ask your health care provider how often you should have your eyes checked.  Personal lifestyle choices, including:  Daily care of your teeth and gums.  Regular physical activity.  Eating a healthy diet.  Avoiding tobacco and drug use.  Limiting alcohol use.  Practicing safe sex.  Taking low-dose aspirin every day.  Taking vitamin and mineral supplements as recommended by your health care provider. What happens during an annual well check? The services and screenings done by your health care provider during your annual well check will depend on your  age, overall health, lifestyle risk factors, and family history of disease. Counseling  Your health care provider may ask you questions about your:  Alcohol use.  Tobacco use.  Drug use.  Emotional well-being.  Home and relationship well-being.  Sexual activity.  Eating habits.  History of falls.  Memory and ability to understand (cognition).  Work and work Statistician.  Reproductive health. Screening  You may have the following tests or measurements:  Height, weight, and BMI.  Blood pressure.  Lipid and cholesterol levels. These may be checked every 5 years, or more frequently if you are over 76 years old.  Skin check.  Lung cancer screening. You may have this screening every year starting at age 76 if you have a 30-pack-year history of smoking and currently smoke or have quit within the past 15 years.  Fecal occult blood test (FOBT) of the stool. You may have this test every year starting at age 76.  Flexible sigmoidoscopy or colonoscopy. You may have a sigmoidoscopy every 5 years or a colonoscopy every 10 years starting at age 76.  Hepatitis C blood test.  Hepatitis B blood test.  Sexually transmitted disease (STD) testing.  Diabetes screening. This is done by checking your blood sugar (glucose) after you have not eaten for a while (fasting). You may have this done every 1-3 years.  Bone density scan. This is done to screen for osteoporosis. You may have this done starting at age 76.  Mammogram. This may be done every 1-2 years. Talk to your health care provider about how often you should have regular mammograms. Talk with your health  care provider about your test results, treatment options, and if necessary, the need for more tests. Vaccines  Your health care provider may recommend certain vaccines, such as:  Influenza vaccine. This is recommended every year.  Tetanus, diphtheria, and acellular pertussis (Tdap, Td) vaccine. You may need a Td booster  every 10 years.  Zoster vaccine. You may need this after age 76.  Pneumococcal 13-valent conjugate (PCV13) vaccine. One dose is recommended after age 3.  Pneumococcal polysaccharide (PPSV23) vaccine. One dose is recommended after age 62. Talk to your health care provider about which screenings and vaccines you need and how often you need them. This information is not intended to replace advice given to you by your health care provider. Make sure you discuss any questions you have with your health care provider. Document Released: 07/16/2015 Document Revised: 03/08/2016 Document Reviewed: 04/20/2015 Elsevier Interactive Patient Education  2017 [place] Prevention in the Home Falls can cause injuries. They can happen to people of all ages. There are many things you can do to make your home safe and to help prevent falls. What can I do on the outside of my home?  Regularly fix the edges of walkways and driveways and fix any cracks.  Remove anything that might make you trip as you walk through a door, such as a raised step or threshold.  Trim any bushes or trees on the path to your home.  Use bright outdoor lighting.  Clear any walking paths of anything that might make someone trip, such as rocks or tools.  Regularly check to see if handrails are loose or broken. Make sure that both sides of any steps have handrails.  Any raised decks and porches should have guardrails on the edges.  Have any leaves, snow, or ice cleared regularly.  Use sand or salt on walking paths during winter.  Clean up any spills in your garage right away. This includes oil or grease spills. What can I do in the bathroom?  Use night lights.  Install grab bars by the toilet and in the tub and shower. Do not use towel bars as grab bars.  Use non-skid mats or decals in the tub or shower.  If you need to sit down in the shower, use a plastic, non-slip stool.  Keep the floor dry. Clean up any  water that spills on the floor as soon as it happens.  Remove soap buildup in the tub or shower regularly.  Attach bath mats securely with double-sided non-slip rug tape.  Do not have throw rugs and other things on the floor that can make you trip. What can I do in the bedroom?  Use night lights.  Make sure that you have a light by your bed that is easy to reach.  Do not use any sheets or blankets that are too big for your bed. They should not hang down onto the floor.  Have a firm chair that has side arms. You can use this for support while you get dressed.  Do not have throw rugs and other things on the floor that can make you trip. What can I do in the kitchen?  Clean up any spills right away.  Avoid walking on wet floors.  Keep items that you use a lot in easy-to-reach places.  If you need to reach something above you, use a strong step stool that has a grab bar.  Keep electrical cords out of the way.  Do not use floor  polish or wax that makes floors slippery. If you must use wax, use non-skid floor wax.  Do not have throw rugs and other things on the floor that can make you trip. What can I do with my stairs?  Do not leave any items on the stairs.  Make sure that there are handrails on both sides of the stairs and use them. Fix handrails that are broken or loose. Make sure that handrails are as long as the stairways.  Check any carpeting to make sure that it is firmly attached to the stairs. Fix any carpet that is loose or worn.  Avoid having throw rugs at the top or bottom of the stairs. If you do have throw rugs, attach them to the floor with carpet tape.  Make sure that you have a light switch at the top of the stairs and the bottom of the stairs. If you do not have them, ask someone to add them for you. What else can I do to help prevent falls?  Wear shoes that:  Do not have high heels.  Have rubber bottoms.  Are comfortable and fit you well.  Are closed  at the toe. Do not wear sandals.  If you use a stepladder:  Make sure that it is fully opened. Do not climb a closed stepladder.  Make sure that both sides of the stepladder are locked into place.  Ask someone to hold it for you, if possible.  Clearly mark and make sure that you can see:  Any grab bars or handrails.  First and last steps.  Where the edge of each step is.  Use tools that help you move around (mobility aids) if they are needed. These include:  Canes.  Walkers.  Scooters.  Crutches.  Turn on the lights when you go into a dark area. Replace any light bulbs as soon as they burn out.  Set up your furniture so you have a clear path. Avoid moving your furniture around.  If any of your floors are uneven, fix them.  If there are any pets around you, be aware of where they are.  Review your medicines with your doctor. Some medicines can make you feel dizzy. This can increase your chance of falling. Ask your doctor what other things that you can do to help prevent falls. This information is not intended to replace advice given to you by your health care provider. Make sure you discuss any questions you have with your health care provider. Document Released: 04/15/2009 Document Revised: 11/25/2015 Document Reviewed: 07/24/2014 Elsevier Interactive Patient Education  2017 [employer].

## 2019-07-15 ENCOUNTER — Ambulatory Visit: Payer: Medicare Other

## 2019-07-15 ENCOUNTER — Encounter: Payer: Medicare Other | Admitting: Family Medicine

## 2019-07-21 ENCOUNTER — Ambulatory Visit: Payer: Medicare Other | Admitting: Acute Care

## 2019-07-21 ENCOUNTER — Encounter: Payer: Self-pay | Admitting: Acute Care

## 2019-07-21 ENCOUNTER — Other Ambulatory Visit: Payer: Self-pay

## 2019-07-21 VITALS — BP 122/70 | HR 96 | Temp 97.8°F | Ht 63.0 in | Wt 161.0 lb

## 2019-07-21 DIAGNOSIS — G4733 Obstructive sleep apnea (adult) (pediatric): Secondary | ICD-10-CM | POA: Diagnosis not present

## 2019-07-21 DIAGNOSIS — Z9989 Dependence on other enabling machines and devices: Secondary | ICD-10-CM

## 2019-07-21 DIAGNOSIS — I10 Essential (primary) hypertension: Secondary | ICD-10-CM

## 2019-07-21 DIAGNOSIS — I2721 Secondary pulmonary arterial hypertension: Secondary | ICD-10-CM

## 2019-07-21 DIAGNOSIS — K766 Portal hypertension: Secondary | ICD-10-CM | POA: Diagnosis not present

## 2019-07-21 NOTE — Progress Notes (Signed)
History of Present Illness Erin Middleton is a 74 y.o. female  never smoker with OSA,  heart murmur and pulmonary hypertension. She was followed by Dr. Leonidas Middleton. She will need reassigning to Dr. Mortimer Middleton.    07/21/2019 6 Month Follow up: Pt. Presents for 6 month follow up. She states she has been doing well. She states she has been doing better with  her CPAP machine. She started her CPAP therapy 11/2018. Her BP today is 122/70. She states she does have some shortness of breath with exertion. She states this is her baseline. She feels she is stable, and that this has not gotten any worse. She states she does have some fatigue with exertion also.  She does endorse some sensation of  Fullness/ swelling in her abdomen and chest. She sites it makes her bra feel tight. She did not mention this to her cardiologist at her last visit. She is not on lasix or following a low salt diet.  Last echo shows EF of 55% and normal systolic function, with  RVSP of  50.2 mmHg, which is an improvement ( 59 mmHg last echo). Her BP is well controlled. She denies any further dizziness. She did have a carotid doppler study done, but the results are not visible in the system.  She has 77% compliance with her CPAP machine. She sleeps about 6-7 hours per night. Has good sleep hygiene. She has all her necessary equipment. She does not need supplies. She feels she is doing a better job getting used to the devise. She denies any fever, chest pain, orthopnea or hemoptysis.   Test Results: Echocardiogram on 02/03/19 revealed normal LV systolic function with an EF estimated greater than 55% with moderate TR, mild MR, and mild AR. Aortic valve was partially mobile. Stress test on 12/23/18 revealed no evidence of ischemia. RVSP is 50.2 mmHg, which has improved from 59 MM Hg  Down Load Air sense 10 AutoSet Minimum pressure 5 cm H2O to max pressure 20 cm H2O Usage days 23 of 30 or 77% Greater than 4 hours 22 days or 73%  less than 4 hours 1  day or 3% Average usage days used 5 hours and 6 minutes Median pressure 11.6 cm H2O AHI 1.1 Central 0.2 Obstructive 0.1  Data 08/01/18 echocardiogram: LVEF 55-60 %.  Normal left ventricular systolic function.  Mild LVH.  Left atrium size normal.  Mild AR, MR, PR.  Moderate-severe tricuspid regurgitation.  Moderate PAH.  RVSP estimate 59 mmHg 08/19/18 overnight oximetry: time < 89%: 11.2 mins. Lowest SpO2 74%. 12 desat events per hour 09/19/18: BNP 81. D-dimer 729 10/03/18 V/Q scan: Patchy matching bilateral nonsegmental ventilation and perfusion defects, possibly related to obstructive lung disease. Low probability study for pulmonary embolus 10/03/18 BLE venous US: No DVT noted in either lower extremity 10/03/18: Normal spirometry, normal lung volumes, DLCO 11.98 (62% predicted).  DLCO/VA 76% predicted 11/07/18 Home sleep study: Moderate OSA. AHI 23/hr. Recommended CPAP AutoSet 5-20 cm H2O 06/15-07/14/20 CPAP compliance: 25/30 nights. Median pressure 10.8 cm H2O.  AHI 0.8 events/hour 02/2019 Echo>> LVEF [id number] normal LV systolic function, with moderate TR, mild MR, and mild AR. Aortic valve was partially mobile. Stress test on 12/23/18 revealed no evidence of ischemia 07/21/2019>> CPAP Compliance>> 23/30 nights, Median Pressure 11.6 cm H2O, AHI 1.1 events/hr  CBC Latest Ref Rng & Units 07/10/2018 03/07/2018 06/06/2017  WBC 3.4 - 10.8 x10E3/uL 6.5 5.8 7.5  Hemoglobin 11.1 - 15.9 g/dL 13.7 13.6 13.5  Hematocrit 34.0 -  46.6 % 41.5 40.9 38.9  Platelets 150 - 450 x10E3/uL 251 249 244    BMP Latest Ref Rng & Units 09/02/2018 07/10/2018 03/07/2018  Glucose 65 - 99 mg/dL 115(H) 127(H) 142(H)  BUN 8 - 27 mg/dL 37(H) 27 23  Creatinine 0.57 - 1.00 mg/dL 1.18(H) 1.23(H) 0.95  BUN/Creat Ratio 12 - 28 31(H) 22 24  Sodium 134 - 144 mmol/L 140 139 142  Potassium 3.5 - 5.2 mmol/L 4.4 4.2 4.2  Chloride 96 - 106 mmol/L 106 100 102  CO2 20 - 29 mmol/L 21 22 23   Calcium 8.7 - 10.3 mg/dL 9.3 9.4 9.7    BNP     Component Value Date/Time   BNP 81.0 09/19/2018 1120    ProBNP No results found for: PROBNP  PFT No results found for: FEV1PRE, FEV1POST, FVCPRE, FVCPOST, TLC, DLCOUNC, PREFEV1FVCRT, PSTFEV1FVCRT  MM 3D SCREEN BREAST UNI RIGHT  Result Date: 06/23/2019 CLINICAL DATA:  Screening. EXAM: DIGITAL SCREENING UNILATERAL RIGHT MAMMOGRAM WITH CAD AND TOMO COMPARISON:  Previous exam(s). ACR Breast Density Category c: The breast tissue is heterogeneously dense, which may obscure small masses. FINDINGS: There are no findings suspicious for malignancy. Images were processed with CAD. IMPRESSION: No mammographic evidence of malignancy. A result letter of this screening mammogram will be mailed directly to the patient. RECOMMENDATION: Screening mammogram in one year. (Code:SM-B-01Y) BI-RADS CATEGORY  1: Negative. Electronically Signed   By: Erin Middleton M.D.   On: 06/23/2019 15:19     Past medical hx Past Medical History:  Diagnosis Date  . Arthritis   . Breast cancer ([place]) 1985   left breast cancer - chemotherapy  . Cancer (HCC)    Breast  . GERD (gastroesophageal reflux disease)   . Headache    migraines h/o  . Hypertension   . Personal history of chemotherapy      Social History   Tobacco Use  . Smoking status: Never Smoker  . Smokeless tobacco: Never Used  Substance Use Topics  . Alcohol use: No    Alcohol/week: 0.0 standard drinks  . Drug use: No    Ms.Erin Middleton reports that she has never smoked. She has never used smokeless tobacco. She reports that she does not drink alcohol or use drugs.  Tobacco Cessation: Never smoker   Past surgical hx, Family hx, Social hx all reviewed.  Current Outpatient Medications on File Prior to Visit  Medication Sig  . amLODipine (NORVASC) 5 MG tablet TAKE 1 TABLET BY MOUTH EVERY DAY IN THE EVENING  . Cholecalciferol (VITAMIN D PO) Take by mouth 2 (two) times daily. Pt unsure of dosage.   . hydrochlorothiazide (MICROZIDE) 12.5 MG capsule TAKE 1  CAPSULE BY MOUTH EVERY DAY  . Multiple Vitamins-Minerals (PRESERVISION/LUTEIN) CAPS Take by mouth 2 (two) times daily. Reported on 09/15/2015  . omeprazole (PRILOSEC) 20 MG capsule TAKE 1 CAPSULE BY MOUTH EVERY DAY IN THE MORNING  . telmisartan (MICARDIS) 80 MG tablet Please specify directions, refills and quantity (Patient taking differently: Take 80 mg by mouth daily. )  . vitamin E 400 UNIT capsule Take 400 Units by mouth daily. Reported on 09/15/2015   No current facility-administered medications on file prior to visit.     No Known Allergies  Review Of Systems:  Constitutional:   No  weight loss, night sweats,  Fevers, chills, +fatigue with exertion, , no  lassitude.  HEENT:   No headaches,  Difficulty swallowing,  Tooth/dental problems, or  Sore throat,  No sneezing, itching, ear ache, nasal congestion, post nasal drip,   CV:  No chest pain,  Orthopnea, PND, swelling in lower extremities, anasarca, dizziness, palpitations, syncope.   GI  No heartburn, indigestion, abdominal pain, nausea, vomiting, diarrhea, change in bowel habits, loss of appetite, bloody stools, + abdominal fullness.   Resp: + shortness of breath with exertion less at rest.  No excess mucus, no productive cough,  No non-productive cough,  No coughing up of blood.  No change in color of mucus.  No wheezing.  No chest wall deformity  Skin: no rash or lesions.  GU: no dysuria, change in color of urine, no urgency or frequency.  No flank pain, no hematuria   MS:  No joint pain or swelling.  No decreased range of motion.  No back pain.  Psych:  No change in mood or affect. No depression or anxiety.  No memory loss.   Vital Signs BP 122/70 (BP Location: Right Arm, Patient Position: Sitting, Cuff Size: Large)   Pulse 96   Temp 97.8 F (36.6 C) (Temporal)   Ht 5\' 3"  (1.6 m)   Wt 161 lb (73 kg)   SpO2 100% Comment: on ra  BMI 28.52 kg/m    Physical Exam:  General- No distress,  A&Ox3,  pleasant ENT: No sinus tenderness, TM clear, pale nasal mucosa, no oral exudate,no post nasal drip, no LAN, no thyromegaly or goiter noted Cardiac: S1, S2, regular rate and rhythm, + murmur Chest: No wheeze/ rales/ dullness; no accessory muscle use, no nasal flaring, no sternal retractions Abd.: Soft Non-tender, ND BS + Ext: No clubbing cyanosis, peripheral edema Neuro:  normal strength, MAE x 4, A&O x 3 Skin: No rashes, warm and dry, no lesions Psych: normal mood and behavior   Assessment/Plan  Hypertension Well controlled on current regimen Have asked patient to follow a low salt diet as she has sensation of abdominal fullness she describes as swelling Follow up with cardiology, and PCP.  OSA on CPAP Compliance is 77% AHI is 1.1 Good Sleep hygiene Does wake up once per night to use the rest room Plan: Continue on CPAP at bedtime. You appear to be benefiting from the treatment  Goal is to wear for at least 6 hours each night for maximal clinical benefit. Continue to work on weight loss, as the link between excess weight  and sleep apnea is well established.   Remember to establish a good bedtime routine, and work on sleep hygiene.  Limit daytime naps , avoid stimulants such as caffeine and nicotine close to bedtime, exercise daily to promote sleep quality, avoid heavy , spicy, fried , or [place] foods before bed. Ensure adequate exposure to natural light during the day,establish a relaxing bedtime routine with a pleasant sleep environment ( Bedroom between 60 and 67 degrees, turn off bright lights , TV or device screens screens , consider black out curtains or white noise machines) Do not drive if sleepy. Remember to clean mask, tubing, filter, and reservoir once weekly with soapy water.  Follow up with Dr. Mortimer Middleton  In 6 months or before as needed.    [place] Last Echo 02/2019 EF 55% RVSP 50 mm Hg Plan Follow up with cards Serial Echo per cards  40 minutes of care was provided  today  Magdalen Spatz, NP 07/21/2019  10:11 AM

## 2019-07-21 NOTE — Assessment & Plan Note (Signed)
Well controlled on current regimen Have asked patient to follow a low salt diet as she has sensation of abdominal fullness she describes as swelling Follow up with cardiology, and PCP.

## 2019-07-21 NOTE — Patient Instructions (Addendum)
It is great to meet you today. Keep up the good work with your CPAP machine. See if you can wear it every night.  Continue on CPAP at bedtime. You appear to be benefiting from the treatment  Goal is to wear for at least 6 hours each night for maximal clinical benefit. Continue to work on weight loss, as the link between excess weight  and sleep apnea is well established.   Remember to establish a good bedtime routine, and work on sleep hygiene.  Limit daytime naps , avoid stimulants such as caffeine and nicotine close to bedtime, exercise daily to promote sleep quality, avoid heavy , spicy, fried , or [place] foods before bed. Ensure adequate exposure to natural light during the day,establish a relaxing bedtime routine with a pleasant sleep environment ( Bedroom between 60 and 67 degrees, turn off bright lights , TV or device screens screens , consider black out curtains or white noise machines) Do not drive if sleepy. Remember to clean mask, tubing, filter, and reservoir once weekly with soapy water.  Follow up with Dr. Mortimer Fries    In 6 months  or before as needed.   Please contact office for sooner follow up if symptoms do not improve or worsen or seek emergency care  Follow up with Cards and Primary care as scheduled re fullness on abdomen Remember to follow a low salt diet  Please contact office for sooner follow up if symptoms do not improve or worsen or seek emergency care

## 2019-07-21 NOTE — Assessment & Plan Note (Addendum)
Compliance is 77% AHI is 1.1 Good Sleep hygiene Does wake up once per night to use the rest room Plan: Continue on CPAP at bedtime. You appear to be benefiting from the treatment  Goal is to wear for at least 6 hours each night for maximal clinical benefit. Continue to work on weight loss, as the link between excess weight  and sleep apnea is well established.   Remember to establish a good bedtime routine, and work on sleep hygiene.  Limit daytime naps , avoid stimulants such as caffeine and nicotine close to bedtime, exercise daily to promote sleep quality, avoid heavy , spicy, fried , or [place] foods before bed. Ensure adequate exposure to natural light during the day,establish a relaxing bedtime routine with a pleasant sleep environment ( Bedroom between 60 and 67 degrees, turn off bright lights , TV or device screens screens , consider black out curtains or white noise machines) Do not drive if sleepy. Remember to clean mask, tubing, filter, and reservoir once weekly with soapy water.  Follow up with Dr. Mortimer Fries  In 6 months or before as needed.

## 2019-07-25 NOTE — Progress Notes (Signed)
Patient: Erin Middleton, Female    DOB: Jan 03, 1946, 74 y.o.   MRN: [id number] Visit Date: 07/30/2019  Today's Provider: Wilhemena Durie, MD   Chief Complaint  Patient presents with  . Annual Exam   Subjective:     Patient had AWV with [facility] on 07/14/2019.   Complete Physical Erin Middleton is a 74 y.o. female. She feels well. She reports exercising some. She reports she is sleeping fairly well.Slowing down some.  -----------------------------------------------------------  Colonoscopy: 04/01/2015 Mammogram: 06/23/2019  Review of Systems  Constitutional: Positive for fatigue.  Eyes: Positive for visual disturbance.  Respiratory: Positive for chest tightness.   Cardiovascular: Negative.   Endocrine: Negative.   Musculoskeletal: Positive for back pain and neck pain.  Skin: Positive for rash.  Allergic/Immunologic: Negative.   Neurological: Positive for dizziness.  Psychiatric/Behavioral: Negative.     Social History   Socioeconomic History  . Marital status: Married    Spouse name: Not on file  . Number of children: 2  . Years of education: Not on file  . Highest education level: Some college, no degree  Occupational History  . Occupation: retired  Tobacco Use  . Smoking status: Never Smoker  . Smokeless tobacco: Never Used  Substance and Sexual Activity  . Alcohol use: No    Alcohol/week: 0.0 standard drinks  . Drug use: No  . Sexual activity: Never  Other Topics Concern  . Not on file  Social History Narrative  . Not on file   Social Determinants of Health   Financial Resource Strain: Low Risk   . Difficulty of Paying Living Expenses: Not hard at all  Food Insecurity: No Food Insecurity  . Worried About Charity fundraiser in the Last Year: Never true  . Ran Out of Food in the Last Year: Never true  Transportation Needs: No Transportation Needs  . Lack of Transportation (Medical): No  . Lack of Transportation (Non-Medical): No  Physical  Activity: Inactive  . Days of Exercise per Week: 0 days  . Minutes of Exercise per Session: 0 min  Stress: No Stress Concern Present  . Feeling of Stress : Not at all  Social Connections: Not Isolated  . Frequency of Communication with Friends and Family: More than three times a week  . Frequency of Social Gatherings with Friends and Family: Twice a week  . Attends Religious Services: More than 4 times per year  . Active Member of Clubs or Organizations: Yes  . Attends Archivist Meetings: More than 4 times per year  . Marital Status: Married  Human resources officer Violence: Not At Risk  . Fear of Current or Ex-Partner: No  . Emotionally Abused: No  . Physically Abused: No  . Sexually Abused: No    Past Medical History:  Diagnosis Date  . Arthritis   . Breast cancer ([place]) 1985   left breast cancer - chemotherapy  . Cancer (HCC)    Breast  . GERD (gastroesophageal reflux disease)   . Headache    migraines h/o  . Hypertension   . Personal history of chemotherapy      Patient Active Problem List   Diagnosis Date Noted  . OSA on CPAP 07/21/2019  . 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 Hypertension 01/18/2015  . Breast CA ([place]) 11/04/2014  . Acid reflux 11/04/2014  . Blood pressure elevated 11/04/2014  . Plantar fasciitis 11/04/2014  . H/O ear disorder 05/19/2014  . Breast cancer, female (  [place]) 05/19/2014  . Dupuytren's contracture of foot 05/19/2014  . Avitaminosis D 09/21/2008  . 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 History of colon polyps 07/19/2006    Past Surgical History:  Procedure Laterality Date  . COLONOSCOPY WITH PROPOFOL N/A 04/01/2015   Procedure: COLONOSCOPY WITH PROPOFOL;  Surgeon: Hulen Luster, MD;  Location: [facility] ENDOSCOPY;  Service: Gastroenterology;  Laterality: N/A;  . DILATION AND CURETTAGE OF UTERUS    . MASTECTOMY Left   . NECK SURGERY     neck deformity-straighten out neck as a child  . PALATE / UVULA BIOPSY / EXCISION    . TOTAL KNEE  ARTHROPLASTY Left 09/22/2015   Procedure: TOTAL KNEE ARTHROPLASTY;  Surgeon: Earnestine Leys, MD;  Location: [facility] ORS;  Service: Orthopedics;  Laterality: Left;  . US CAROTID DOPPLER BILATERAL ([place] HX)      Her family history includes Dementia in her mother; Diabetes in her brother, brother, and mother; Heart disease in her father; Lung cancer in her father; Stroke in her brother and mother; Throat cancer in her brother. There is no history of Breast cancer.   Current Outpatient Medications:  .  amLODipine (NORVASC) 5 MG tablet, TAKE 1 TABLET BY MOUTH EVERY DAY IN THE EVENING, Disp: 90 tablet, Rfl: 3 .  Cholecalciferol (VITAMIN D PO), Take by mouth 2 (two) times daily. Pt unsure of dosage. , Disp: , Rfl:  .  hydrochlorothiazide (MICROZIDE) 12.5 MG capsule, TAKE 1 CAPSULE BY MOUTH EVERY DAY, Disp: 90 capsule, Rfl: 0 .  Multiple Vitamins-Minerals (PRESERVISION/LUTEIN) CAPS, Take by mouth 2 (two) times daily. Reported on 09/15/2015, Disp: , Rfl:  .  omeprazole (PRILOSEC) 20 MG capsule, TAKE 1 CAPSULE BY MOUTH EVERY DAY IN THE MORNING, Disp: 90 capsule, Rfl: 4 .  telmisartan (MICARDIS) 80 MG tablet, Please specify directions, refills and quantity (Patient taking differently: Take 80 mg by mouth daily. ), Disp: 90 tablet, Rfl: 3 .  vitamin E 400 UNIT capsule, Take 400 Units by mouth daily. Reported on 09/15/2015, Disp: , Rfl:   Patient Care Team: Jerrol Banana., MD as PCP - General (Family Medicine) Earnestine Leys, MD as Consulting Physician (Specialist) Ubaldo Glassing Javier Docker, MD as Consulting Physician (Cardiology) Avie Arenas, PA-C as Physician Assistant (Gastroenterology) Magdalen Spatz, NP as Nurse Practitioner (Pulmonary Disease)     Objective:    Vitals: BP 124/73 (BP Location: Right Arm, Patient Position: Sitting, Cuff Size: Large)   Pulse 64   Temp (!) 96.8 F (36 C) (Other (Comment))   Resp 18   Ht 5\' 3"  (1.6 m)   Wt 159 lb (72.1 kg)   SpO2 96%   BMI 28.17 kg/m    Physical Exam Vitals reviewed. Exam conducted with a chaperone present.  Constitutional:      Appearance: Normal appearance. She is normal weight.  HENT:     Head: Normocephalic and atraumatic.     Right Ear: Tympanic membrane normal.     Left Ear: Tympanic membrane normal.     Nose: Nose normal.     Mouth/Throat:     Mouth: Mucous membranes are moist.     Pharynx: Oropharynx is clear.  Eyes:     Extraocular Movements: Extraocular movements intact.     Conjunctiva/sclera: Conjunctivae normal.     Pupils: Pupils are equal, round, and reactive to light.  Cardiovascular:     Rate and Rhythm: Normal rate and regular rhythm.     Pulses: Normal pulses.     Heart sounds: Murmur present.  Comments: 2/6 murmur at LUSB. Pulmonary:     Effort: Pulmonary effort is normal.     Breath sounds: Normal breath sounds.  Chest:     Breasts:        Right: Normal. No mass.        Left: Tenderness present.     Comments: Mild terness in fat pad lateral/inferior to old mastectomy site. Abdominal:     General: Abdomen is flat. Bowel sounds are normal.     Palpations: Abdomen is soft.  Musculoskeletal:        General: Normal range of motion.     Cervical back: Normal range of motion and neck supple.  Skin:    General: Skin is warm and dry.  Neurological:     General: No focal deficit present.     Mental Status: She is alert and oriented to person, place, and time. Mental status is at baseline.     Motor: No weakness.     Gait: Gait normal.  Psychiatric:        Mood and Affect: Mood normal.        Behavior: Behavior normal.        Thought Content: Thought content normal.        Judgment: Judgment normal.     Activities of Daily Living In your present state of health, do you have any difficulty performing the following activities: 07/14/2019  Hearing? N  Vision? Y  Comment Due to blurred vision in the right eye. Apt to evaluate is scheduled.  Difficulty concentrating or making  decisions? N  Walking or climbing stairs? Y  Comment Due to back pain and tiredness.  Dressing or bathing? N  Doing errands, shopping? N  Preparing Food and eating ? N  Using the Toilet? N  In the past six months, have you accidently leaked urine? N  Do you have problems with loss of bowel control? N  Managing your Medications? N  Managing your Finances? N  Housekeeping or managing your Housekeeping? N  Some recent data might be hidden    Fall Risk Assessment Fall Risk  07/14/2019 07/09/2018 06/06/2017 05/10/2016 02/23/2015  Falls in the past year? 0 1 No No Yes  Number falls in past yr: 0 1 - - 1  Injury with Fall? 0 0 - - No  Risk for fall due to : - Impaired balance/gait - - -  Follow up - Falls prevention discussed - - -     Depression Screen PHQ 2/9 Scores 07/14/2019 07/14/2019 07/09/2018 07/09/2018  PHQ - 2 Score 0 0 0 0  PHQ- 9 Score - - 3 -    6CIT Screen 05/10/2016  What Year? 0 points  What month? 0 points  What time? 0 points  Count back from 20 0 points  Months in reverse 0 points  Repeat phrase 4 points  Total Score 4       Assessment & Plan:    Annual Physical Reviewed patient's Family Medical History Reviewed and updated list of patient's medical providers Assessment of cognitive impairment was done Assessed patient's functional ability Established a written schedule for health screening [place] Completed and Reviewed  Exercise Activities and Dietary recommendations Goals    . Exercise 3x per week (30 min per time)     Recommend to start walking at least 3 times a week for 30 minutes at a time.       Immunization History  Administered Date(s) Administered  . Fluad  Quad(high Dose 65+) 04/15/2019  . Influenza, High Dose Seasonal PF 05/10/2016, 06/06/2017, 03/07/2018  . Influenza-Unspecified 04/09/2015  . PFIZER SARS-COV-2 Vaccination 07/26/2019  . Pneumococcal Conjugate-13 05/10/2016  . Pneumococcal Polysaccharide-23 03/06/2012   . Td 01/23/2003    Health Maintenance  Topic Date Due  . TETANUS/TDAP  07/13/2020 (Originally 01/22/2013)  . COLONOSCOPY  03/31/2020  . DEXA SCAN  03/14/2021  . INFLUENZA VACCINE  Completed  . Hepatitis C Screening  Completed  . PNA vac Low Risk Adult  Completed     Discussed health benefits of physical activity, and encouraged her to engage in regular exercise appropriate for her age and condition.    --------------------------------------------------------------------  1. Annual physical exam  - CBC w/Diff/Platelet - Comprehensive Metabolic Panel (CMET) - Lipid panel - TSH - Hemoglobin A1C  2. Essential hypertension  - CBC w/Diff/Platelet - Comprehensive Metabolic Panel (CMET) - Lipid panel - TSH - Hemoglobin A1C  3. Pre-diabetes  - CBC w/Diff/Platelet - Comprehensive Metabolic Panel (CMET) - Lipid panel - TSH - Hemoglobin A1C  4. Breast tenderness in female, left//sp left mastectomy for breast cancer Can use heating and ice on tender spot for 3 days. Follow up in 3 months to recheck.  Normal exam.   I,Deiondra Denley,acting as a scribe for Wilhemena Durie, MD.,have documented all relevant documentation on the behalf of Kiannah Menth, MD,as directed by  Wilhemena Durie, MD while in the presence of Wilhemena Durie, MD.    Wilhemena Durie, MD  [facility]

## 2019-07-26 ENCOUNTER — Ambulatory Visit: Payer: Medicare PPO | Attending: Internal Medicine

## 2019-07-26 DIAGNOSIS — Z23 Encounter for immunization: Secondary | ICD-10-CM | POA: Insufficient documentation

## 2019-07-26 NOTE — Progress Notes (Signed)
   Covid-19 Vaccination Clinic  Name:  Erin Middleton    MRN: [id number] DOB: 1945-07-17  07/26/2019  [name] was observed post Covid-19 immunization for 15 minutes without incidence. She was provided with Vaccine Information Sheet and instruction to access the V-Safe system.   [name] was instructed to call 911 with any severe reactions post vaccine: Marland Kitchen Difficulty breathing  . Swelling of your face and throat  . A fast heartbeat  . A bad rash all over your body  . Dizziness and weakness    Immunizations Administered    Name Date Dose VIS Date Route   Pfizer COVID-19 Vaccine 07/26/2019 12:01 PM 0.3 mL 06/13/2019 Intramuscular   Manufacturer: [place]   Lot: [id number]   [place]: [id number]

## 2019-07-30 ENCOUNTER — Other Ambulatory Visit: Payer: Self-pay

## 2019-07-30 ENCOUNTER — Ambulatory Visit (INDEPENDENT_AMBULATORY_CARE_PROVIDER_SITE_OTHER): Payer: Medicare PPO | Admitting: Family Medicine

## 2019-07-30 ENCOUNTER — Encounter: Payer: Self-pay | Admitting: Family Medicine

## 2019-07-30 VITALS — BP 124/73 | HR 64 | Temp 96.8°F | Resp 18 | Ht 63.0 in | Wt 159.0 lb

## 2019-07-30 DIAGNOSIS — I10 Essential (primary) hypertension: Secondary | ICD-10-CM

## 2019-07-30 DIAGNOSIS — Z0000 Encounter for general adult medical examination without abnormal findings: Secondary | ICD-10-CM

## 2019-07-30 DIAGNOSIS — R7303 Prediabetes: Secondary | ICD-10-CM | POA: Diagnosis not present

## 2019-07-30 DIAGNOSIS — N644 Mastodynia: Secondary | ICD-10-CM | POA: Diagnosis not present

## 2019-07-30 NOTE — Patient Instructions (Signed)
Breast tenderness in female Can use heating and ice on tender spot for 3 days. Follow up in 3 months to recheck.

## 2019-07-31 LAB — CBC WITH DIFFERENTIAL/PLATELET
Basophils Absolute: 0 10*3/uL (ref 0.0–0.2)
Basos: 0 %
EOS (ABSOLUTE): 0.3 10*3/uL (ref 0.0–0.4)
Eos: 3 %
Hematocrit: 41.8 % (ref 34.0–46.6)
Hemoglobin: 13.9 g/dL (ref 11.1–15.9)
Immature Grans (Abs): 0 10*3/uL (ref 0.0–0.1)
Immature Granulocytes: 0 %
Lymphocytes Absolute: 2.3 10*3/uL (ref 0.7–3.1)
Lymphs: 30 %
MCH: 31.3 pg (ref 26.6–33.0)
MCHC: 33.3 g/dL (ref 31.5–35.7)
MCV: 94 fL (ref 79–97)
Monocytes Absolute: 0.5 10*3/uL (ref 0.1–0.9)
Monocytes: 7 %
Neutrophils Absolute: 4.6 10*3/uL (ref 1.4–7.0)
Neutrophils: 60 %
Platelets: 247 10*3/uL (ref 150–450)
RBC: 4.44 x10E6/uL (ref 3.77–5.28)
RDW: 12.2 % (ref 11.7–15.4)
WBC: 7.8 10*3/uL (ref 3.4–10.8)

## 2019-07-31 LAB — COMPREHENSIVE METABOLIC PANEL
ALT: 22 IU/L (ref 0–32)
AST: 20 IU/L (ref 0–40)
Albumin/Globulin Ratio: 1.6 (ref 1.2–2.2)
Albumin: 4.5 g/dL (ref 3.7–4.7)
Alkaline Phosphatase: 115 IU/L (ref 39–117)
BUN/Creatinine Ratio: 24 (ref 12–28)
BUN: 24 mg/dL (ref 8–27)
Bilirubin Total: 0.4 mg/dL (ref 0.0–1.2)
CO2: 23 mmol/L (ref 20–29)
Calcium: 9.5 mg/dL (ref 8.7–10.3)
Chloride: 102 mmol/L (ref 96–106)
Creatinine, Ser: 0.98 mg/dL (ref 0.57–1.00)
GFR calc Af Amer: 65 mL/min/{1.73_m2} (ref >59)
GFR calc non Af Amer: 57 mL/min/{1.73_m2} — ABNORMAL LOW (ref >59)
Globulin, Total: 2.9 g/dL (ref 1.5–4.5)
Glucose: 94 mg/dL (ref 65–99)
Potassium: 4.1 mmol/L (ref 3.5–5.2)
Sodium: 139 mmol/L (ref 134–144)
Total Protein: 7.4 g/dL (ref 6.0–8.5)

## 2019-07-31 LAB — LIPID PANEL
Chol/HDL Ratio: 3.7 ratio (ref 0.0–4.4)
Cholesterol, Total: 208 mg/dL — ABNORMAL HIGH (ref 100–199)
HDL: 56 mg/dL (ref >39)
LDL Chol Calc (NIH): 129 mg/dL — ABNORMAL HIGH (ref 0–99)
Triglycerides: 127 mg/dL (ref 0–149)
VLDL Cholesterol Cal: 23 mg/dL (ref 5–40)

## 2019-07-31 LAB — TSH: TSH: 2.92 u[IU]/mL (ref 0.450–4.500)

## 2019-07-31 LAB — HEMOGLOBIN A1C
Est. average glucose Bld gHb Est-mCnc: 140 mg/dL
Hgb A1c MFr Bld: 6.5 % — ABNORMAL HIGH (ref 4.8–5.6)

## 2019-08-04 ENCOUNTER — Telehealth: Payer: Self-pay

## 2019-08-04 NOTE — Telephone Encounter (Signed)
Left detailed vm for patient regarding lab results/

## 2019-08-16 ENCOUNTER — Ambulatory Visit: Payer: Medicare PPO

## 2019-08-18 ENCOUNTER — Ambulatory Visit: Payer: Medicare PPO | Attending: Internal Medicine

## 2019-08-18 DIAGNOSIS — Z23 Encounter for immunization: Secondary | ICD-10-CM

## 2019-08-18 NOTE — Progress Notes (Signed)
   Covid-19 Vaccination Clinic  Name:  Erin Middleton    MRN: [id number] DOB: 03/12/1946  08/18/2019  [name] was observed post Covid-19 immunization for 15 minutes without incidence. She was provided with Vaccine Information Sheet and instruction to access the V-Safe system.   [name] was instructed to call 911 with any severe reactions post vaccine: Marland Kitchen Difficulty breathing  . Swelling of your face and throat  . A fast heartbeat  . A bad rash all over your body  . Dizziness and weakness    Immunizations Administered    Name Date Dose VIS Date Route   Pfizer COVID-19 Vaccine 08/18/2019 12:19 PM 0.3 mL 06/13/2019 Intramuscular   Manufacturer: [place]   Lot: [id number]   [place]: [id number]

## 2019-09-26 ENCOUNTER — Other Ambulatory Visit: Payer: Self-pay | Admitting: Family Medicine

## 2019-09-27 DIAGNOSIS — G4733 Obstructive sleep apnea (adult) (pediatric): Secondary | ICD-10-CM | POA: Diagnosis not present

## 2019-10-24 NOTE — Progress Notes (Signed)
Established patient visit   Patient: Erin Middleton   DOB: January 07, 1946   74 y.o. Female  MRN: [id number] Visit Date: 10/28/2019  Today's healthcare provider: Wilhemena Durie, MD   Chief Complaint  Patient presents with  . Follow-up  . Hypertension   Subjective    HPI  Hypertension, follow-up  BP Readings from Last 3 Encounters:  10/28/19 119/75  07/30/19 124/73  07/21/19 122/70   She was last seen for hypertension 3 months ago.  BP at that visit was 07/30/1971. Management since that visit includes; labs checked showing-stable. She reports good compliance with treatment. She is not having side effects. none She is not exercising. She is adherent to low salt diet.   Outside blood pressures are not checking.  She does not smoke.  Use of agents associated with hypertension: none.   --------------------------------------------------------------------   Breast tenderness in female, left//sp left mastectomy for breast cancer From 07/30/2019-Can use heating and ice on tender spot for 3 days. Follow up in 3 months to recheck.  Normal exam.  Patient states she is still having tenderness in left rib area, under breast. It is not changed and there is no skin problem and no warmth or drainage. She has no associated symptoms such as shortness of breath or diaphoresis.  OSA She is now using CPAP but does not really like it. She does not notice a difference but is trying to get used to using it regularly. She is using it nightly.     Medications: Outpatient Medications Prior to Visit  Medication Sig  . amLODipine (NORVASC) 5 MG tablet TAKE 1 TABLET BY MOUTH EVERY DAY IN THE EVENING  . Cholecalciferol (VITAMIN D PO) Take by mouth 2 (two) times daily. Pt unsure of dosage.   . hydrochlorothiazide (MICROZIDE) 12.5 MG capsule TAKE 1 CAPSULE BY MOUTH EVERY DAY  . Multiple Vitamins-Minerals (PRESERVISION/LUTEIN) CAPS Take by mouth 2 (two) times daily. Reported on 09/15/2015  .  omeprazole (PRILOSEC) 20 MG capsule TAKE 1 CAPSULE BY MOUTH EVERY DAY IN THE MORNING  . telmisartan (MICARDIS) 80 MG tablet Please specify directions, refills and quantity (Patient taking differently: Take 80 mg by mouth daily. )  . vitamin E 400 UNIT capsule Take 400 Units by mouth daily. Reported on 09/15/2015   No facility-administered medications prior to visit.    Review of Systems  Constitutional: Negative for appetite change, chills, fatigue and fever.  HENT: Negative.   Eyes: Negative.   Respiratory: Negative for chest tightness and shortness of breath.   Cardiovascular: Negative for chest pain and palpitations.  Gastrointestinal: Negative for abdominal pain, nausea and vomiting.  Musculoskeletal: Positive for arthralgias and back pain.  Allergic/Immunologic: Negative.   Neurological: Negative for dizziness and weakness.  Hematological: Negative.   Psychiatric/Behavioral: Negative.         Objective    BP 119/75 (BP Location: Left Arm, Patient Position: Sitting, Cuff Size: Large)   Pulse 66   Temp (!) 96.9 F (36.1 C) (Other (Comment))   Resp 16   Ht 5\' 3"  (1.6 m)   Wt 166 lb (75.3 kg)   SpO2 94%   BMI 29.41 kg/m  BP Readings from Last 3 Encounters:  10/28/19 119/75  07/30/19 124/73  07/21/19 122/70   Wt Readings from Last 3 Encounters:  10/28/19 166 lb (75.3 kg)  07/30/19 159 lb (72.1 kg)  07/21/19 161 lb (73 kg)      Physical Exam Vitals reviewed. Exam conducted with a chaperone present.  Constitutional:      Appearance: Normal appearance. She is normal weight.  HENT:     Head: Normocephalic and atraumatic.     Right Ear: Tympanic membrane normal.     Left Ear: Tympanic membrane normal.     Nose: Nose normal.     Mouth/Throat:     Mouth: Mucous membranes are moist.     Pharynx: Oropharynx is clear.  Eyes:     Extraocular Movements: Extraocular movements intact.     Conjunctiva/sclera: Conjunctivae normal.     Pupils: Pupils are equal, round, and  reactive to light.  Cardiovascular:     Rate and Rhythm: Normal rate and regular rhythm.     Pulses: Normal pulses.     Heart sounds: Murmur present.     Comments: 2/6 murmur at LUSB. Pulmonary:     Effort: Pulmonary effort is normal.     Breath sounds: Normal breath sounds.  Chest:     Breasts:        Right: Normal. No mass.        Left: Tenderness present.     Comments: Mild terness in fat pad lateral/inferior to old mastectomy site. Abdominal:     General: Abdomen is flat. Bowel sounds are normal.     Palpations: Abdomen is soft.  Musculoskeletal:     Cervical back: Normal range of motion and neck supple.     Comments: She has chest wall tenderness under the lipoma/fat pad. No crepitance and it is mild  Skin:    General: Skin is warm and dry.  Neurological:     General: No focal deficit present.     Mental Status: She is alert and oriented to person, place, and time. Mental status is at baseline.     Motor: No weakness.     Gait: Gait normal.  Psychiatric:        Mood and Affect: Mood normal.        Behavior: Behavior normal.        Thought Content: Thought content normal.        Judgment: Judgment normal.       Results for orders placed or performed in visit on 10/28/19  POCT glycosylated hemoglobin (Hb A1C)  Result Value Ref Range   Hemoglobin A1C 5.9 (A) 4.0 - 5.6 %   Est. average glucose Bld gHb Est-mCnc 123     Assessment & Plan    1. Essential hypertension Controlled.  2. Pre-diabetes  - POCT glycosylated hemoglobin (Hb A1C) 5.9 today.  3. Breast tenderness in female Is status post left mastectomy  4. Chest wall pain Try naproxen 375 mg two times daily.  - naproxen (NAPROSYN) 375 MG tablet; Take 1 tablet (375 mg total) by mouth 2 (two) times daily with a meal.  Dispense: 60 tablet; Refill: 1   5. OSA on CPAP Patient using CPAP. Encouraged her to continue to work on using it all night long.  6. Status post total knee replacement, unspecified  laterality   7. Malignant neoplasm of left female breast, unspecified estrogen receptor status, unspecified site of breast ([place]) 1990s  8. Avitaminosis D      Return in about 4 months (around 02/27/2020).       I, Wilhemena Durie, MD, have reviewed all documentation for this visit. The documentation on 10/31/19 for the exam, diagnosis, procedures, and orders are all accurate and complete.   Dmitriy Gair Cranford Mon, MD  [facility] [phone] (phone) [phone] (fax)  [facility]

## 2019-10-28 ENCOUNTER — Encounter: Payer: Self-pay | Admitting: Family Medicine

## 2019-10-28 ENCOUNTER — Ambulatory Visit (INDEPENDENT_AMBULATORY_CARE_PROVIDER_SITE_OTHER): Payer: Medicare PPO | Admitting: Family Medicine

## 2019-10-28 ENCOUNTER — Other Ambulatory Visit: Payer: Self-pay

## 2019-10-28 VITALS — BP 119/75 | HR 66 | Temp 96.9°F | Resp 16 | Ht 63.0 in | Wt 166.0 lb

## 2019-10-28 DIAGNOSIS — E559 Vitamin D deficiency, unspecified: Secondary | ICD-10-CM

## 2019-10-28 DIAGNOSIS — C50912 Malignant neoplasm of unspecified site of left female breast: Secondary | ICD-10-CM | POA: Diagnosis not present

## 2019-10-28 DIAGNOSIS — I10 Essential (primary) hypertension: Secondary | ICD-10-CM

## 2019-10-28 DIAGNOSIS — R7303 Prediabetes: Secondary | ICD-10-CM

## 2019-10-28 DIAGNOSIS — R0789 Other chest pain: Secondary | ICD-10-CM

## 2019-10-28 DIAGNOSIS — Z96659 Presence of unspecified artificial knee joint: Secondary | ICD-10-CM

## 2019-10-28 DIAGNOSIS — Z9989 Dependence on other enabling machines and devices: Secondary | ICD-10-CM

## 2019-10-28 DIAGNOSIS — G4733 Obstructive sleep apnea (adult) (pediatric): Secondary | ICD-10-CM

## 2019-10-28 DIAGNOSIS — N644 Mastodynia: Secondary | ICD-10-CM | POA: Diagnosis not present

## 2019-10-28 LAB — POCT GLYCOSYLATED HEMOGLOBIN (HGB A1C)
Est. average glucose Bld gHb Est-mCnc: 123
Hemoglobin A1C: 5.9 % — AB (ref 4.0–5.6)

## 2019-10-28 MED ORDER — NAPROXEN 375 MG PO TABS: 375.0000 mg | ORAL_TABLET | Freq: Two times a day (BID) | ORAL | 1 refills | Status: DC

## 2019-11-27 DIAGNOSIS — G4733 Obstructive sleep apnea (adult) (pediatric): Secondary | ICD-10-CM | POA: Diagnosis not present

## 2019-12-04 ENCOUNTER — Other Ambulatory Visit: Payer: Self-pay | Admitting: Family Medicine

## 2019-12-11 DIAGNOSIS — I272 Pulmonary hypertension, unspecified: Secondary | ICD-10-CM | POA: Diagnosis not present

## 2019-12-11 DIAGNOSIS — I6522 Occlusion and stenosis of left carotid artery: Secondary | ICD-10-CM | POA: Diagnosis not present

## 2019-12-11 DIAGNOSIS — G4733 Obstructive sleep apnea (adult) (pediatric): Secondary | ICD-10-CM | POA: Diagnosis not present

## 2019-12-11 DIAGNOSIS — I10 Essential (primary) hypertension: Secondary | ICD-10-CM | POA: Diagnosis not present

## 2019-12-11 DIAGNOSIS — I071 Rheumatic tricuspid insufficiency: Secondary | ICD-10-CM | POA: Diagnosis not present

## 2019-12-11 DIAGNOSIS — R002 Palpitations: Secondary | ICD-10-CM | POA: Diagnosis not present

## 2019-12-18 DIAGNOSIS — H353131 Nonexudative age-related macular degeneration, bilateral, early dry stage: Secondary | ICD-10-CM | POA: Diagnosis not present

## 2019-12-24 ENCOUNTER — Other Ambulatory Visit: Payer: Self-pay

## 2019-12-24 ENCOUNTER — Encounter (INDEPENDENT_AMBULATORY_CARE_PROVIDER_SITE_OTHER): Payer: Medicare PPO | Admitting: Ophthalmology

## 2019-12-24 DIAGNOSIS — H35033 Hypertensive retinopathy, bilateral: Secondary | ICD-10-CM | POA: Diagnosis not present

## 2019-12-24 DIAGNOSIS — H353132 Nonexudative age-related macular degeneration, bilateral, intermediate dry stage: Secondary | ICD-10-CM

## 2019-12-24 DIAGNOSIS — I10 Essential (primary) hypertension: Secondary | ICD-10-CM | POA: Diagnosis not present

## 2019-12-24 DIAGNOSIS — H43813 Vitreous degeneration, bilateral: Secondary | ICD-10-CM

## 2019-12-26 ENCOUNTER — Other Ambulatory Visit: Payer: Self-pay | Admitting: Family Medicine

## 2019-12-28 DIAGNOSIS — G4733 Obstructive sleep apnea (adult) (pediatric): Secondary | ICD-10-CM | POA: Diagnosis not present

## 2020-01-20 ENCOUNTER — Encounter (INDEPENDENT_AMBULATORY_CARE_PROVIDER_SITE_OTHER): Payer: Medicare PPO | Admitting: Ophthalmology

## 2020-01-20 ENCOUNTER — Other Ambulatory Visit: Payer: Self-pay

## 2020-01-20 DIAGNOSIS — H353132 Nonexudative age-related macular degeneration, bilateral, intermediate dry stage: Secondary | ICD-10-CM | POA: Diagnosis not present

## 2020-01-20 DIAGNOSIS — H43813 Vitreous degeneration, bilateral: Secondary | ICD-10-CM

## 2020-01-20 DIAGNOSIS — H35033 Hypertensive retinopathy, bilateral: Secondary | ICD-10-CM

## 2020-01-20 DIAGNOSIS — I10 Essential (primary) hypertension: Secondary | ICD-10-CM | POA: Diagnosis not present

## 2020-01-23 ENCOUNTER — Other Ambulatory Visit: Payer: Self-pay

## 2020-01-23 ENCOUNTER — Encounter: Payer: Self-pay | Admitting: Pulmonary Disease

## 2020-01-23 ENCOUNTER — Ambulatory Visit: Payer: Medicare PPO | Admitting: Pulmonary Disease

## 2020-01-23 VITALS — BP 116/72 | HR 74 | Temp 97.6°F | Ht 63.0 in | Wt 165.2 lb

## 2020-01-23 DIAGNOSIS — G4733 Obstructive sleep apnea (adult) (pediatric): Secondary | ICD-10-CM | POA: Diagnosis not present

## 2020-01-23 DIAGNOSIS — Z9989 Dependence on other enabling machines and devices: Secondary | ICD-10-CM | POA: Diagnosis not present

## 2020-01-23 DIAGNOSIS — I2721 Secondary pulmonary arterial hypertension: Secondary | ICD-10-CM | POA: Diagnosis not present

## 2020-01-23 NOTE — Assessment & Plan Note (Signed)
Plan: Continue CPAP therapy Encouraged increased CPAP use Follow-up in 6 months

## 2020-01-23 NOTE — Progress Notes (Signed)
@Patient  ID: Erin Middleton, female    DOB: 09-07-1945, 74 y.o.   MRN: [id number]  Chief Complaint  Patient presents with  . Follow-up    shortness of breath exertion    Referring provider: Jerrol Banana.,*  HPI:  74 year old female never smoker followed in our office for obstructive sleep apnea  PMH: History of breast cancer, Smoker/ Smoking History: Never smoker Maintenance: None Pt of: Needs [place] pulmonologist  01/23/2020  - Visit   74 year old female never smoker followed in our office here for obstructive sleep apnea.  Patient was last seen in our office in January/2021 by SG NP.  At that office visit was recommended the patient establish care with Dr. Juliann Mule.  Patient is also followed by cardiology with serial echocardiograms.  Last echocardiogram was in August/2020 that showed an ejection fraction of 55% as well as right ventricular systolic pressure of 50.  Patient CPAP compliance report today shows adequate compliance.  See compliance report listed below:  12/23/2019-01/21/2020-CPAP compliance report-22 out of last 30 days used, 16 of those days greater than 4 hours, average usage 4 hours and 58 minutes, APAP setting 5-20, AHI 1.3  Patient's home sleep study which was done in May/2020 showed moderate obstructive sleep apnea with an AHI of 22.6, SaO2 low 66%  Patient reports that overall she is been doing well since last being seen.  She still does have some baseline dyspnea.  She reports that she will see cardiology again in December/2021 and sees her primary care in August.  She has no official issues with using CPAP therapy.    Questionaires / Pulmonary Flowsheets:   ACT:  No flowsheet data found.  MMRC: No flowsheet data found.  Epworth:  Results of the Epworth flowsheet 10/21/2018  Sitting and reading 1  Watching TV 1  Sitting, inactive in a public place (e.g. a theatre or a meeting) 0  As a passenger in a car for an hour without a break 0  Lying  down to rest in the afternoon when circumstances permit 1  Sitting and talking to someone 0  Sitting quietly after a lunch without alcohol 0  In a car, while stopped for a few minutes in traffic 0  Total score 3    Tests:   FENO:  No results found for: NITRICOXIDE  PFT: No flowsheet data found.  WALK:  No flowsheet data found.  Imaging: No results found.  Lab Results:  CBC    Component Value Date/Time   WBC 7.8 07/30/2019 1535   WBC 7.5 06/06/2017 1120   RBC 4.44 07/30/2019 1535   RBC 4.22 06/06/2017 1120   HGB 13.9 07/30/2019 1535   HCT 41.8 07/30/2019 1535   PLT 247 07/30/2019 1535   MCV 94 07/30/2019 1535   MCH 31.3 07/30/2019 1535   MCH 32.0 06/06/2017 1120   MCHC 33.3 07/30/2019 1535   MCHC 34.7 06/06/2017 1120   RDW 12.2 07/30/2019 1535   LYMPHSABS 2.3 07/30/2019 1535   MONOABS 0.5 09/08/2015 1427   EOSABS 0.3 07/30/2019 1535   BASOSABS 0.0 07/30/2019 1535    BMET    Component Value Date/Time   NA 139 07/30/2019 1535   K 4.1 07/30/2019 1535   CL 102 07/30/2019 1535   CO2 23 07/30/2019 1535   GLUCOSE 94 07/30/2019 1535   GLUCOSE 113 (H) 06/06/2017 1120   BUN 24 07/30/2019 1535   CREATININE 0.98 07/30/2019 1535   CREATININE 0.94 (H) 06/06/2017 1120   CALCIUM  9.5 07/30/2019 1535   GFRNONAA 57 (L) 07/30/2019 1535   GFRNONAA 60 06/06/2017 1120   GFRAA 65 07/30/2019 1535   GFRAA 70 06/06/2017 1120    BNP    Component Value Date/Time   BNP 81.0 09/19/2018 1120    ProBNP No results found for: PROBNP  Specialty Problems      Pulmonary Problems   OSA on CPAP    08/19/18 overnight oximetry: time < 89%: 11.2 mins. Lowest SpO2 74%. 12 desat events per hour 11/07/18 Home sleep study: Moderate OSA. AHI 23/hr. Recommended CPAP AutoSet 5-20 cm H2O          No Known Allergies  Immunization History  Administered Date(s) Administered  . Fluad Quad(high Dose 65+) 04/15/2019  . Influenza, High Dose Seasonal PF 05/10/2016, 06/06/2017,  03/07/2018  . Influenza-Unspecified 04/09/2015  . PFIZER SARS-COV-2 Vaccination 07/26/2019, 08/18/2019  . Pneumococcal Conjugate-13 05/10/2016  . Pneumococcal Polysaccharide-23 03/06/2012  . Td 01/23/2003    Past Medical History:  Diagnosis Date  . Arthritis   . Breast cancer ([place]) 1985   left breast cancer - chemotherapy  . Cancer (HCC)    Breast  . GERD (gastroesophageal reflux disease)   . Headache    migraines h/o  . Hypertension   . Personal history of chemotherapy     Tobacco History: Social History   Tobacco Use  Smoking Status Never Smoker  Smokeless Tobacco Never Used   Counseling given: Yes   Continue to not smoke  Outpatient Encounter Medications as of 01/23/2020  Medication Sig  . amLODipine (NORVASC) 5 MG tablet TAKE 1 TABLET BY MOUTH EVERY DAY IN THE EVENING  . Cholecalciferol (VITAMIN D PO) Take by mouth 2 (two) times daily. Pt unsure of dosage.   . hydrochlorothiazide (MICROZIDE) 12.5 MG capsule TAKE 1 CAPSULE BY MOUTH EVERY DAY  . Multiple Vitamins-Minerals (PRESERVISION/LUTEIN) CAPS Take by mouth 2 (two) times daily. Reported on 09/15/2015  . naproxen (NAPROSYN) 375 MG tablet Take 1 tablet (375 mg total) by mouth 2 (two) times daily with a meal.  . omeprazole (PRILOSEC) 20 MG capsule TAKE 1 CAPSULE BY MOUTH EVERY DAY IN THE MORNING  . simvastatin (ZOCOR) 10 MG tablet   . telmisartan (MICARDIS) 80 MG tablet TAKE 1 TABLET BY MOUTH DAILY  . vitamin E 400 UNIT capsule Take 400 Units by mouth daily. Reported on 09/15/2015   No facility-administered encounter medications on file as of 01/23/2020.     Review of Systems  Review of Systems  Constitutional: Positive for fatigue. Negative for activity change and fever.  HENT: Negative for sinus pressure, sinus pain and sore throat.   Respiratory: Positive for shortness of breath. Negative for cough and wheezing.   Cardiovascular: Negative for chest pain and palpitations.  Gastrointestinal: Negative for  diarrhea, nausea and vomiting.  Musculoskeletal: Negative for arthralgias.  Neurological: Negative for dizziness.  Psychiatric/Behavioral: Negative for sleep disturbance. The patient is not nervous/anxious.      Physical Exam  BP 116/72 (BP Location: Right Arm, Cuff Size: Normal)   Pulse 74   Temp 97.6 F (36.4 C) (Temporal)   Ht 5\' 3"  (1.6 m)   Wt 165 lb 3.2 oz (74.9 kg)   SpO2 97% Comment: Room air  BMI 29.26 kg/m   Wt Readings from Last 5 Encounters:  01/23/20 165 lb 3.2 oz (74.9 kg)  10/28/19 166 lb (75.3 kg)  07/30/19 159 lb (72.1 kg)  07/21/19 161 lb (73 kg)  01/17/19 168 lb 9.6 oz (76.5 kg)  BMI Readings from Last 5 Encounters:  01/23/20 29.26 kg/m  10/28/19 29.41 kg/m  07/30/19 28.17 kg/m  07/21/19 28.52 kg/m  01/17/19 29.87 kg/m     Physical Exam Vitals and nursing note reviewed.  Constitutional:      General: She is not in acute distress.    Appearance: Normal appearance.  HENT:     Head: Normocephalic and atraumatic.     Right Ear: External ear normal.     Left Ear: External ear normal.     Nose: Nose normal. No congestion.     Mouth/Throat:     Mouth: Mucous membranes are moist.     Pharynx: Oropharynx is clear.  Eyes:     Pupils: Pupils are equal, round, and reactive to light.  Cardiovascular:     Rate and Rhythm: Normal rate and regular rhythm.     Pulses: Normal pulses.     Heart sounds: Normal heart sounds. No murmur heard.   Pulmonary:     Effort: Pulmonary effort is normal. No respiratory distress.     Breath sounds: Normal breath sounds. No decreased air movement. No decreased breath sounds, wheezing or rales.  Abdominal:     General: Abdomen is flat. Bowel sounds are normal.     Palpations: Abdomen is soft.  Musculoskeletal:     Cervical back: Normal range of motion.     Right lower leg: Edema (Trace) present.     Left lower leg: Edema (Trace) present.  Skin:    General: Skin is warm and dry.     Capillary Refill: Capillary  refill takes less than 2 seconds.  Neurological:     General: No focal deficit present.     Mental Status: She is alert and oriented to person, place, and time. Mental status is at baseline.     Gait: Gait normal.  Psychiatric:        Mood and Affect: Mood normal.        Behavior: Behavior normal.        Thought Content: Thought content normal.        Judgment: Judgment normal.       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  Emphasized importance of compliance with CPAP therapy  Plan: Continue follow-up with cardiology  OSA on CPAP Plan: Continue CPAP therapy Encouraged increased CPAP use Follow-up in 6 months    Return in about 6 months (around 07/25/2020), or if symptoms worsen or fail to improve, for [facility] - Dr. Mortimer Fries, [place].   Lauraine Rinne, NP 01/23/2020   This appointment required 32 minutes of patient care (this includes precharting, chart review, review of results, face-to-face care, etc.).

## 2020-01-23 NOTE — Patient Instructions (Addendum)
You were seen today by Lauraine Rinne, NP  for:   1. OSA on CPAP  We recommend that you continue using your CPAP daily >>>Keep up the hard work using your device >>> Goal should be wearing this for the entire night that you are sleeping, at least 4 to 6 hours  Remember:  . Do not drive or operate heavy machinery if tired or drowsy.  . Please notify the supply company and office if you are unable to use your device regularly due to missing supplies or machine being broken.  . Work on maintaining a healthy weight and following your recommended nutrition plan  . Maintain proper daily exercise and movement  . Maintaining proper use of your device can also help improve management of other chronic illnesses such as: Blood pressure, blood sugars, and weight management.   BiPAP/ CPAP Cleaning:  >>>Clean weekly, with Dawn soap, and bottle brush.  Set up to air dry. >>> Wipe mask out daily with wet wipe or towelette  2. PAH (pulmonary artery hypertension) ([place])  Keep follow up with Cardiology   Follow Up:    Return in about 6 months (around 07/25/2020), or if symptoms worsen or fail to improve, for [facility] - Dr. Mortimer Fries, [place].   Please do your part to reduce the spread of COVID-19:      Reduce your risk of any infection  and COVID19 by using the similar precautions used for avoiding the common cold or flu:  Marland Kitchen Wash your hands often with soap and warm water for at least 20 seconds.  If soap and water are not readily available, use an alcohol-based hand sanitizer with at least 60% alcohol.  . If coughing or sneezing, cover your mouth and nose by coughing or sneezing into the elbow areas of your shirt or coat, into a tissue or into your sleeve (not your hands). Langley Gauss A MASK when in public  . Avoid shaking hands with others and consider head nods or verbal greetings only. . Avoid touching your eyes, nose, or mouth with unwashed hands.  . Avoid close contact with people who are  sick. . Avoid places or events with large numbers of people in one location, like concerts or sporting events. . If you have some symptoms but not all symptoms, continue to monitor at home and seek medical attention if your symptoms worsen. . If you are having a medical emergency, call 911.   [place] / e-Visit: [email or web address]         [facility]: [facility]: [phone]                   [facility]: [phone]     It is flu season:   >>> Best ways to protect herself from the flu: Receive the yearly flu vaccine, practice good hand hygiene washing with soap and also using hand sanitizer when available, eat a nutritious meals, get adequate rest, hydrate appropriately   Please contact the office if your symptoms worsen or you have concerns that you are not improving.   Thank you for choosing [facility] for your healthcare, and for allowing [place] to partner with you on your healthcare journey. I am thankful to be able to provide care to you today.   Wyn Quaker FNP-C

## 2020-01-23 NOTE — Assessment & Plan Note (Signed)
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  Emphasized importance of compliance with CPAP therapy  Plan: Continue follow-up with cardiology

## 2020-01-27 DIAGNOSIS — G4733 Obstructive sleep apnea (adult) (pediatric): Secondary | ICD-10-CM | POA: Diagnosis not present

## 2020-02-26 NOTE — Progress Notes (Signed)
Established patient visit   Patient: Erin Middleton   DOB: 1946-04-04   74 y.o. Female  MRN: [id number] Visit Date: 03/01/2020  Today's healthcare provider: Wilhemena Durie, MD   Chief Complaint  Patient presents with  . Hypertension  . Pre-Diabetes   Subjective    HPI  Patient overall is doing well and feels well. Hypertension, follow-up  BP Readings from Last 3 Encounters:  03/01/20 130/76  01/23/20 116/72  10/28/19 119/75   Wt Readings from Last 3 Encounters:  03/01/20 170 lb 12.8 oz (77.5 kg)  01/23/20 165 lb 3.2 oz (74.9 kg)  10/28/19 166 lb (75.3 kg)     She was last seen for hypertension 4 months ago.  BP at that visit was 119/75. Management since that visit includes; controlled. She reports excellent compliance with treatment. She is not having side effects.  She is not exercising. She is adherent to low salt diet.   Outside blood pressures are not being checked.  She does not smoke.  Use of agents associated with hypertension: none.   ---------------------------------------------------------------------------------------------------   Pre-diabetes From 10/28/2019-POCT glycosylated hemoglobin (Hb A1C) 5.9 today.  Chest wall pain From 10/28/2019-Try naproxen 375 mg two times daily.   Patient Active Problem List   Diagnosis Date Noted  . PAH (pulmonary artery hypertension) ([place]) 01/23/2020  . OSA on CPAP 07/21/2019  . 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 Hypertension 01/18/2015  . Breast CA ([place]) 11/04/2014  . Acid reflux 11/04/2014  . Blood pressure elevated 11/04/2014  . Plantar fasciitis 11/04/2014  . H/O ear disorder 05/19/2014  . Breast cancer, female ([place]) 05/19/2014  . Dupuytren's contracture of foot 05/19/2014  . Avitaminosis D 09/21/2008  . 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 History of colon polyps 07/19/2006   Past Medical History:  Diagnosis Date  . Arthritis   . Breast cancer ([place]) 1985   left breast cancer -  chemotherapy  . Cancer (HCC)    Breast  . GERD (gastroesophageal reflux disease)   . Headache    migraines h/o  . Hypertension   . Personal history of chemotherapy    Social History   Socioeconomic History  . Marital status: Married    Spouse name: Not on file  . Number of children: 2  . Years of education: Not on file  . Highest education level: Some college, no degree  Occupational History  . Occupation: retired  Tobacco Use  . Smoking status: Never Smoker  . Smokeless tobacco: Never Used  Vaping Use  . Vaping Use: Never used  Substance and Sexual Activity  . Alcohol use: No    Alcohol/week: 0.0 standard drinks  . Drug use: No  . Sexual activity: Never  Other Topics Concern  . Not on file  Social History Narrative  . Not on file   Social Determinants of Health   Financial Resource Strain: Low Risk   . Difficulty of Paying Living Expenses: Not hard at all  Food Insecurity: No Food Insecurity  . Worried About Charity fundraiser in the Last Year: Never true  . Ran Out of Food in the Last Year: Never true  Transportation Needs: No Transportation Needs  . Lack of Transportation (Medical): No  . Lack of Transportation (Non-Medical): No  Physical Activity: Inactive  . Days of Exercise per Week: 0 days  . Minutes of Exercise per Session: 0 min  Stress: No Stress Concern Present  . Feeling of Stress : Not at all  Social Connections: Socially Integrated  . Frequency of Communication with Friends and Family: More than three times a week  . Frequency of Social Gatherings with Friends and Family: Twice a week  . Attends Religious Services: More than 4 times per year  . Active Member of Clubs or Organizations: Yes  . Attends Archivist Meetings: More than 4 times per year  . Marital Status: Married  Human resources officer Violence: Not At Risk  . Fear of Current or Ex-Partner: No  . Emotionally Abused: No  . Physically Abused: No  . Sexually Abused: No   No  Known Allergies     Medications: Outpatient Medications Prior to Visit  Medication Sig  . amLODipine (NORVASC) 5 MG tablet TAKE 1 TABLET BY MOUTH EVERY DAY IN THE EVENING  . Cholecalciferol (VITAMIN D PO) Take by mouth 2 (two) times daily. Pt unsure of dosage.   . hydrochlorothiazide (MICROZIDE) 12.5 MG capsule TAKE 1 CAPSULE BY MOUTH EVERY DAY  . Multiple Vitamins-Minerals (PRESERVISION/LUTEIN) CAPS Take by mouth 2 (two) times daily. Reported on 09/15/2015  . naproxen (NAPROSYN) 375 MG tablet Take 1 tablet (375 mg total) by mouth 2 (two) times daily with a meal.  . omeprazole (PRILOSEC) 20 MG capsule TAKE 1 CAPSULE BY MOUTH EVERY DAY IN THE MORNING  . simvastatin (ZOCOR) 10 MG tablet   . telmisartan (MICARDIS) 80 MG tablet TAKE 1 TABLET BY MOUTH DAILY  . vitamin E 400 UNIT capsule Take 400 Units by mouth daily. Reported on 09/15/2015   No facility-administered medications prior to visit.    Review of Systems  Last lipids Lab Results  Component Value Date   CHOL 208 (H) 07/30/2019   HDL 56 07/30/2019   LDLCALC 129 (H) 07/30/2019   TRIG 127 07/30/2019   CHOLHDL 3.7 07/30/2019   Last hemoglobin A1c Lab Results  Component Value Date   HGBA1C 5.9 (A) 10/28/2019      Objective    BP 130/76   Pulse 61   Temp 98.2 F (36.8 C) (Oral)   Resp 16   Wt 170 lb 12.8 oz (77.5 kg)   BMI 30.26 kg/m  BP Readings from Last 3 Encounters:  03/01/20 130/76  01/23/20 116/72  10/28/19 119/75   Wt Readings from Last 3 Encounters:  03/01/20 170 lb 12.8 oz (77.5 kg)  01/23/20 165 lb 3.2 oz (74.9 kg)  10/28/19 166 lb (75.3 kg)      Physical Exam Vitals reviewed. Exam conducted with a chaperone present.  Constitutional:      Appearance: Normal appearance. She is normal weight.  HENT:     Head: Normocephalic and atraumatic.     Right Ear: Tympanic membrane normal.     Left Ear: Tympanic membrane normal.     Nose: Nose normal.     Mouth/Throat:     Mouth: Mucous membranes are  moist.     Pharynx: Oropharynx is clear.  Eyes:     Extraocular Movements: Extraocular movements intact.     Conjunctiva/sclera: Conjunctivae normal.     Pupils: Pupils are equal, round, and reactive to light.  Cardiovascular:     Rate and Rhythm: Normal rate and regular rhythm.     Pulses: Normal pulses.     Heart sounds: Murmur heard.      Comments: 2/6 murmur at LUSB. Pulmonary:     Effort: Pulmonary effort is normal.     Breath sounds: Normal breath sounds.  Chest:     Breasts:  Right: No mass.   Abdominal:     General: Abdomen is flat. Bowel sounds are normal.     Palpations: Abdomen is soft.  Musculoskeletal:     Cervical back: Normal range of motion and neck supple.  Skin:    General: Skin is warm and dry.  Neurological:     General: No focal deficit present.     Mental Status: She is alert and oriented to person, place, and time. Mental status is at baseline.     Motor: No weakness.     Gait: Gait normal.  Psychiatric:        Mood and Affect: Mood normal.        Behavior: Behavior normal.        Thought Content: Thought content normal.        Judgment: Judgment normal.       No results found for any visits on 03/01/20.  Assessment & Plan     1. Controlled type 2 diabetes mellitus without complication, without long-term current use of insulin ([place]) Patient now diabetic with A1c of 6.5.  Start metformin daily and will write her for a glucometer.  Return to clinic 4 months - metFORMIN (GLUCOPHAGE) 500 MG tablet; Take 1 tablet (500 mg total) by mouth daily with supper.  Dispense: 90 tablet; Refill: 3  2. Essential hypertension   3. Pre-diabetes  - POCT HgB A1C  4. OSA on CPAP   5. History of left breast cancer    No follow-ups on file.         Tawnya Pujol Cranford Mon, MD  [facility] [phone] (phone) [phone] (fax)  [place]

## 2020-02-27 DIAGNOSIS — G4733 Obstructive sleep apnea (adult) (pediatric): Secondary | ICD-10-CM | POA: Diagnosis not present

## 2020-03-01 ENCOUNTER — Other Ambulatory Visit: Payer: Self-pay

## 2020-03-01 ENCOUNTER — Ambulatory Visit: Payer: Medicare PPO | Admitting: Family Medicine

## 2020-03-01 ENCOUNTER — Encounter: Payer: Self-pay | Admitting: Family Medicine

## 2020-03-01 ENCOUNTER — Telehealth: Payer: Self-pay | Admitting: *Deleted

## 2020-03-01 VITALS — BP 130/76 | HR 61 | Temp 98.2°F | Resp 16 | Wt 170.8 lb

## 2020-03-01 DIAGNOSIS — I10 Essential (primary) hypertension: Secondary | ICD-10-CM

## 2020-03-01 DIAGNOSIS — E119 Type 2 diabetes mellitus without complications: Secondary | ICD-10-CM | POA: Diagnosis not present

## 2020-03-01 DIAGNOSIS — G4733 Obstructive sleep apnea (adult) (pediatric): Secondary | ICD-10-CM | POA: Diagnosis not present

## 2020-03-01 DIAGNOSIS — Z853 Personal history of malignant neoplasm of breast: Secondary | ICD-10-CM | POA: Diagnosis not present

## 2020-03-01 DIAGNOSIS — R7303 Prediabetes: Secondary | ICD-10-CM

## 2020-03-01 DIAGNOSIS — Z9989 Dependence on other enabling machines and devices: Secondary | ICD-10-CM | POA: Diagnosis not present

## 2020-03-01 MED ORDER — BLOOD GLUCOSE METER KIT: PACK | 0 refills | Status: AC

## 2020-03-01 MED ORDER — BLOOD GLUCOSE METER KIT: PACK | 0 refills | Status: DC

## 2020-03-01 MED ORDER — METFORMIN HCL 500 MG PO TABS: 500.0000 mg | ORAL_TABLET | Freq: Every day | ORAL | 3 refills | Status: DC

## 2020-03-01 NOTE — Patient Instructions (Signed)

## 2020-03-01 NOTE — Telephone Encounter (Signed)
Copied from [place] [id number]. Topic: General - Other >> Mar 01, 2020  3:56 PM Keene Breath wrote: Reason for CRM: Patient called to ask the nurse to call regarding her glucose meter kit.  She stated the pharmacy told her that they needed more information and she did not know exactly what they needed.  CB# [phone]

## 2020-03-01 NOTE — Telephone Encounter (Signed)
Please advise 

## 2020-03-03 MED ORDER — ROSUVASTATIN CALCIUM 10 MG PO TABS: 10.0000 mg | ORAL_TABLET | Freq: Every day | ORAL | 3 refills | Status: DC

## 2020-03-09 LAB — POCT GLYCOSYLATED HEMOGLOBIN (HGB A1C)
Est. average glucose Bld gHb Est-mCnc: 140
Hemoglobin A1C: 6.5 % — AB (ref 4.0–5.6)

## 2020-03-11 ENCOUNTER — Telehealth: Payer: Self-pay | Admitting: Family Medicine

## 2020-03-11 NOTE — Telephone Encounter (Signed)
Pt called stating that she has questions regarding medications she has been taking. She states that she is looking for clarification and is not wanting to have any medication interactions. She is requesting to have a call back to discuss. Please advise.      [id number] - [place], [place] - [address]  [address] [place] [address]  Phone: [phone] Fax: [phone]  Hours: Not open 24 hours

## 2020-03-11 NOTE — Telephone Encounter (Signed)
Patient advised to take crestor for cholesterol instead of the zocor.

## 2020-03-17 ENCOUNTER — Other Ambulatory Visit: Payer: Self-pay

## 2020-03-17 ENCOUNTER — Encounter (INDEPENDENT_AMBULATORY_CARE_PROVIDER_SITE_OTHER): Payer: Medicare PPO | Admitting: Ophthalmology

## 2020-03-17 DIAGNOSIS — H353132 Nonexudative age-related macular degeneration, bilateral, intermediate dry stage: Secondary | ICD-10-CM | POA: Diagnosis not present

## 2020-03-17 DIAGNOSIS — H35033 Hypertensive retinopathy, bilateral: Secondary | ICD-10-CM

## 2020-03-17 DIAGNOSIS — I10 Essential (primary) hypertension: Secondary | ICD-10-CM | POA: Diagnosis not present

## 2020-03-17 DIAGNOSIS — H43813 Vitreous degeneration, bilateral: Secondary | ICD-10-CM | POA: Diagnosis not present

## 2020-03-22 ENCOUNTER — Other Ambulatory Visit: Payer: Self-pay | Admitting: Family Medicine

## 2020-03-29 DIAGNOSIS — G4733 Obstructive sleep apnea (adult) (pediatric): Secondary | ICD-10-CM | POA: Diagnosis not present

## 2020-04-23 DIAGNOSIS — M1711 Unilateral primary osteoarthritis, right knee: Secondary | ICD-10-CM | POA: Diagnosis not present

## 2020-04-28 DIAGNOSIS — G4733 Obstructive sleep apnea (adult) (pediatric): Secondary | ICD-10-CM | POA: Diagnosis not present

## 2020-05-06 DIAGNOSIS — M25661 Stiffness of right knee, not elsewhere classified: Secondary | ICD-10-CM | POA: Diagnosis not present

## 2020-05-06 DIAGNOSIS — M25561 Pain in right knee: Secondary | ICD-10-CM | POA: Diagnosis not present

## 2020-05-10 ENCOUNTER — Other Ambulatory Visit: Payer: Self-pay | Admitting: Family Medicine

## 2020-05-10 DIAGNOSIS — I10 Essential (primary) hypertension: Secondary | ICD-10-CM

## 2020-05-13 DIAGNOSIS — M25661 Stiffness of right knee, not elsewhere classified: Secondary | ICD-10-CM | POA: Diagnosis not present

## 2020-05-13 DIAGNOSIS — M25561 Pain in right knee: Secondary | ICD-10-CM | POA: Diagnosis not present

## 2020-05-14 ENCOUNTER — Other Ambulatory Visit: Payer: Self-pay | Admitting: Family Medicine

## 2020-05-14 DIAGNOSIS — Z1231 Encounter for screening mammogram for malignant neoplasm of breast: Secondary | ICD-10-CM

## 2020-05-19 ENCOUNTER — Other Ambulatory Visit: Payer: Self-pay

## 2020-05-19 ENCOUNTER — Ambulatory Visit: Payer: Medicare PPO

## 2020-05-19 ENCOUNTER — Ambulatory Visit (INDEPENDENT_AMBULATORY_CARE_PROVIDER_SITE_OTHER): Payer: Medicare PPO

## 2020-05-19 DIAGNOSIS — Z23 Encounter for immunization: Secondary | ICD-10-CM

## 2020-05-20 DIAGNOSIS — M25561 Pain in right knee: Secondary | ICD-10-CM | POA: Diagnosis not present

## 2020-05-20 DIAGNOSIS — M25661 Stiffness of right knee, not elsewhere classified: Secondary | ICD-10-CM | POA: Diagnosis not present

## 2020-05-22 ENCOUNTER — Other Ambulatory Visit: Payer: Self-pay | Admitting: Family Medicine

## 2020-05-25 DIAGNOSIS — M25561 Pain in right knee: Secondary | ICD-10-CM | POA: Diagnosis not present

## 2020-05-25 DIAGNOSIS — M25661 Stiffness of right knee, not elsewhere classified: Secondary | ICD-10-CM | POA: Diagnosis not present

## 2020-05-29 DIAGNOSIS — G4733 Obstructive sleep apnea (adult) (pediatric): Secondary | ICD-10-CM | POA: Diagnosis not present

## 2020-06-01 ENCOUNTER — Ambulatory Visit: Payer: Medicare PPO | Attending: Internal Medicine

## 2020-06-01 ENCOUNTER — Other Ambulatory Visit: Payer: Self-pay | Admitting: Internal Medicine

## 2020-06-01 DIAGNOSIS — Z23 Encounter for immunization: Secondary | ICD-10-CM

## 2020-06-01 NOTE — Progress Notes (Signed)
   Covid-19 Vaccination Clinic  Name:  Erin Middleton    MRN: [id number] DOB: [birth year]  06/01/2020  [name] was observed post Covid-19 immunization for 15 minutes without incident. She was provided with Vaccine Information Sheet and instruction to access the V-Safe system.   [name] was instructed to call 911 with any severe reactions post vaccine: Marland Kitchen Difficulty breathing  . Swelling of face and throat  . A fast heartbeat  . A bad rash all over body  . Dizziness and weakness   Immunizations Administered    Name Date Dose VIS Date Route   Pfizer COVID-19 Vaccine 06/01/2020 10:13 AM 0.3 mL 04/21/2020 Intramuscular   Manufacturer: [employer]   Lot: [id number]   [place]: [id number]

## 2020-06-03 DIAGNOSIS — M25561 Pain in right knee: Secondary | ICD-10-CM | POA: Diagnosis not present

## 2020-06-03 DIAGNOSIS — M25661 Stiffness of right knee, not elsewhere classified: Secondary | ICD-10-CM | POA: Diagnosis not present

## 2020-06-07 DIAGNOSIS — R002 Palpitations: Secondary | ICD-10-CM | POA: Diagnosis not present

## 2020-06-07 DIAGNOSIS — R000 Tachycardia, unspecified: Secondary | ICD-10-CM | POA: Diagnosis not present

## 2020-06-07 DIAGNOSIS — G4733 Obstructive sleep apnea (adult) (pediatric): Secondary | ICD-10-CM | POA: Diagnosis not present

## 2020-06-07 DIAGNOSIS — I071 Rheumatic tricuspid insufficiency: Secondary | ICD-10-CM | POA: Diagnosis not present

## 2020-06-07 DIAGNOSIS — I2721 Secondary pulmonary arterial hypertension: Secondary | ICD-10-CM | POA: Diagnosis not present

## 2020-06-07 DIAGNOSIS — I6522 Occlusion and stenosis of left carotid artery: Secondary | ICD-10-CM | POA: Diagnosis not present

## 2020-06-07 DIAGNOSIS — I272 Pulmonary hypertension, unspecified: Secondary | ICD-10-CM | POA: Diagnosis not present

## 2020-06-07 DIAGNOSIS — I10 Essential (primary) hypertension: Secondary | ICD-10-CM | POA: Diagnosis not present

## 2020-06-07 DIAGNOSIS — R011 Cardiac murmur, unspecified: Secondary | ICD-10-CM | POA: Diagnosis not present

## 2020-06-17 ENCOUNTER — Other Ambulatory Visit: Payer: Self-pay | Admitting: Family Medicine

## 2020-06-27 DIAGNOSIS — Z20822 Contact with and (suspected) exposure to covid-19: Secondary | ICD-10-CM | POA: Diagnosis not present

## 2020-06-28 DIAGNOSIS — G4733 Obstructive sleep apnea (adult) (pediatric): Secondary | ICD-10-CM | POA: Diagnosis not present

## 2020-07-02 ENCOUNTER — Other Ambulatory Visit: Payer: Self-pay | Admitting: Family Medicine

## 2020-07-02 DIAGNOSIS — K219 Gastro-esophageal reflux disease without esophagitis: Secondary | ICD-10-CM

## 2020-07-02 NOTE — Telephone Encounter (Signed)
Requested medications are due for refill today yes  Requested medications are on the active medication list yes  Last refill 10/4  Last visit 03/2018  Future visit scheduled 08/2020  Notes to clinic Failed protocol due to no valid visit within 6  months, does have upcoming visit scheduled.

## 2020-07-06 DIAGNOSIS — I071 Rheumatic tricuspid insufficiency: Secondary | ICD-10-CM | POA: Diagnosis not present

## 2020-07-06 DIAGNOSIS — R011 Cardiac murmur, unspecified: Secondary | ICD-10-CM | POA: Diagnosis not present

## 2020-07-09 ENCOUNTER — Telehealth: Payer: Self-pay | Admitting: Family Medicine

## 2020-07-09 NOTE — Telephone Encounter (Signed)
Copied from [place] [phone]. Topic: Medicare AWV >> Jul 09, 2020 12:10 PM Cher Nakai R wrote: Reason for CRM:   Left message for patient to call back and schedule Medicare Annual Wellness Visit (AWV) in office.   If not able to come in office, please offer to do virtually.   Last AWV 07/14/2019  Please schedule at anytime with [facility] Health Advisor.  If any questions, please contact me at [phone]

## 2020-07-12 ENCOUNTER — Encounter: Payer: Self-pay | Admitting: Family Medicine

## 2020-07-12 ENCOUNTER — Ambulatory Visit
Admission: RE | Admit: 2020-07-12 | Discharge: 2020-07-12 | Disposition: A | Discharge status: Home / Self Care | Payer: Medicare PPO | Source: Ambulatory Visit | Attending: Family Medicine | Admitting: Family Medicine

## 2020-07-12 ENCOUNTER — Other Ambulatory Visit: Payer: Self-pay

## 2020-07-12 DIAGNOSIS — Z1231 Encounter for screening mammogram for malignant neoplasm of breast: Secondary | ICD-10-CM | POA: Diagnosis not present

## 2020-07-21 ENCOUNTER — Encounter (INDEPENDENT_AMBULATORY_CARE_PROVIDER_SITE_OTHER): Payer: Medicare PPO | Admitting: Ophthalmology

## 2020-07-21 ENCOUNTER — Other Ambulatory Visit: Payer: Self-pay

## 2020-07-21 DIAGNOSIS — H353132 Nonexudative age-related macular degeneration, bilateral, intermediate dry stage: Secondary | ICD-10-CM | POA: Diagnosis not present

## 2020-07-21 DIAGNOSIS — I10 Essential (primary) hypertension: Secondary | ICD-10-CM

## 2020-07-21 DIAGNOSIS — H35033 Hypertensive retinopathy, bilateral: Secondary | ICD-10-CM | POA: Diagnosis not present

## 2020-07-21 DIAGNOSIS — H43813 Vitreous degeneration, bilateral: Secondary | ICD-10-CM | POA: Diagnosis not present

## 2020-07-29 DIAGNOSIS — G4733 Obstructive sleep apnea (adult) (pediatric): Secondary | ICD-10-CM | POA: Diagnosis not present

## 2020-08-03 ENCOUNTER — Ambulatory Visit (INDEPENDENT_AMBULATORY_CARE_PROVIDER_SITE_OTHER): Payer: Medicare PPO | Admitting: Family Medicine

## 2020-08-03 ENCOUNTER — Other Ambulatory Visit: Payer: Self-pay

## 2020-08-03 ENCOUNTER — Encounter: Payer: Self-pay | Admitting: Family Medicine

## 2020-08-03 VITALS — BP 112/56 | HR 69 | Temp 97.7°F | Resp 16 | Ht 63.0 in | Wt 164.0 lb

## 2020-08-03 DIAGNOSIS — Z9989 Dependence on other enabling machines and devices: Secondary | ICD-10-CM | POA: Diagnosis not present

## 2020-08-03 DIAGNOSIS — E7800 Pure hypercholesterolemia, unspecified: Secondary | ICD-10-CM

## 2020-08-03 DIAGNOSIS — Z0000 Encounter for general adult medical examination without abnormal findings: Secondary | ICD-10-CM

## 2020-08-03 DIAGNOSIS — G4733 Obstructive sleep apnea (adult) (pediatric): Secondary | ICD-10-CM | POA: Diagnosis not present

## 2020-08-03 DIAGNOSIS — E119 Type 2 diabetes mellitus without complications: Secondary | ICD-10-CM

## 2020-08-03 DIAGNOSIS — Z853 Personal history of malignant neoplasm of breast: Secondary | ICD-10-CM

## 2020-08-03 DIAGNOSIS — I10 Essential (primary) hypertension: Secondary | ICD-10-CM

## 2020-08-03 NOTE — Progress Notes (Signed)
Annual Wellness Visit     Patient: Erin Middleton, Female    DOB: 11-20-1945, 75 y.o.   MRN: [id number] Visit Date: 08/03/2020  Today's Provider: Wilhemena Durie, MD   Chief Complaint  Patient presents with  . Annual Exam   Subjective    Erin Middleton is a 75 y.o. female who presents today for her Annual Wellness Visit.  She reports consuming a general diet. The patient does not participate in regular exercise at present. She generally feels fairly well. She reports sleeping fairly well. She does not have additional problems to discuss today.  Her husband died over Christmas from Covid related illness and heart disease.  She is coping with this. HPI  Diabetes Mellitus Type II, Follow-up  Lab Results  Component Value Date   HGBA1C 6.5 (A) 03/09/2020   HGBA1C 5.9 (A) 10/28/2019   HGBA1C 6.5 (H) 07/30/2019   Wt Readings from Last 3 Encounters:  08/03/20 164 lb (74.4 kg)  03/01/20 170 lb 12.8 oz (77.5 kg)  01/23/20 165 lb 3.2 oz (74.9 kg)   Last seen for diabetes 6 months ago.  Management since then includes starting on metformin. She reports good compliance with treatment. She is not having side effects.  Symptoms: Yes fatigue No foot ulcerations  No appetite changes No nausea  No paresthesia of the feet  No polydipsia  No polyuria No visual disturbances   No vomiting     Home blood sugar records: fasting range: 140s  Episodes of hypoglycemia? No    Current insulin regiment: none Most Recent Eye Exam: due. F/B Dr. Zigmund Daniel for macular degeneration.  Current exercise: no regular exercise Current diet habits: well balanced  Pertinent Labs: Lab Results  Component Value Date   CHOL 208 (H) 07/30/2019   HDL 56 07/30/2019   LDLCALC 129 (H) 07/30/2019   TRIG 127 07/30/2019   CHOLHDL 3.7 07/30/2019   Lab Results  Component Value Date   NA 139 07/30/2019   K 4.1 07/30/2019   CREATININE 0.98 07/30/2019   GFRNONAA 57 (L) 07/30/2019   GFRAA 65  07/30/2019   GLUCOSE 94 07/30/2019          Medications: Outpatient Medications Prior to Visit  Medication Sig  . ACCU-CHEK GUIDE test strip CHECK BLOOD SUGAR EVERY DAY  . amLODipine (NORVASC) 5 MG tablet TAKE 1 TABLET BY MOUTH EVERY DAY IN THE EVENING  . blood glucose meter kit and supplies Dispense based on patient and insurance preference. Check blood sugar once a day (FOR ICD-10 E10.9, E11.9).  Marland Kitchen Cholecalciferol (VITAMIN D PO) Take by mouth 2 (two) times daily. Pt unsure of dosage.  . hydrochlorothiazide (MICROZIDE) 12.5 MG capsule TAKE 1 CAPSULE BY MOUTH EVERY DAY  . metFORMIN (GLUCOPHAGE) 500 MG tablet Take 1 tablet (500 mg total) by mouth daily with supper.  . Multiple Vitamins-Minerals (PRESERVISION/LUTEIN) CAPS Take by mouth 2 (two) times daily. Reported on 09/15/2015  . naproxen (NAPROSYN) 375 MG tablet Take 1 tablet (375 mg total) by mouth 2 (two) times daily with a meal.  . omeprazole (PRILOSEC) 20 MG capsule TAKE 1 CAPSULE BY MOUTH EVERY DAY IN THE MORNING  . telmisartan (MICARDIS) 80 MG tablet TAKE 1 TABLET BY MOUTH DAILY  . vitamin E 400 UNIT capsule Take 400 Units by mouth daily. Reported on 09/15/2015   No facility-administered medications prior to visit.    No Known Allergies  Patient Care Team: Jerrol Banana., MD as PCP - General (  Family Medicine) Earnestine Leys, MD as Consulting Physician (Specialist) Ubaldo Glassing Javier Docker, MD as Consulting Physician (Cardiology) Rhea Bleacher as Physician Assistant (Gastroenterology) Magdalen Spatz, NP as Nurse Practitioner (Pulmonary Disease)  Review of Systems  All other systems reviewed and are negative.      Objective    Vitals: BP (!) 112/56   Pulse 69   Temp 97.7 F (36.5 C)   Resp 16   Ht $R'5\' 3"'PZ$  (1.6 m)   Wt 164 lb (74.4 kg)   BMI 29.05 kg/m  BP Readings from Last 3 Encounters:  08/03/20 (!) 112/56  03/01/20 130/76  01/23/20 116/72   Wt Readings from Last 3 Encounters:  08/03/20 164 lb (74.4  kg)  03/01/20 170 lb 12.8 oz (77.5 kg)  01/23/20 165 lb 3.2 oz (74.9 kg)      Physical Exam Vitals reviewed. Exam conducted with a chaperone present.  Constitutional:      Appearance: Normal appearance. She is normal weight.  HENT:     Head: Normocephalic and atraumatic.     Right Ear: Tympanic membrane normal.     Left Ear: Tympanic membrane normal.     Nose: Nose normal.     Mouth/Throat:     Mouth: Mucous membranes are moist.     Pharynx: Oropharynx is clear.  Eyes:     Extraocular Movements: Extraocular movements intact.     Conjunctiva/sclera: Conjunctivae normal.     Pupils: Pupils are equal, round, and reactive to light.  Cardiovascular:     Rate and Rhythm: Normal rate and regular rhythm.     Pulses: Normal pulses.     Heart sounds: Murmur heard.      Comments: 2/6 murmur at LUSB. Pulmonary:     Effort: Pulmonary effort is normal.     Breath sounds: Normal breath sounds.  Chest:  Breasts:     Right: No mass.    Abdominal:     General: Abdomen is flat. Bowel sounds are normal.     Palpations: Abdomen is soft.  Musculoskeletal:     Cervical back: Normal range of motion and neck supple.  Skin:    General: Skin is warm and dry.  Neurological:     General: No focal deficit present.     Mental Status: She is alert and oriented to person, place, and time.     Motor: No weakness.     Gait: Gait normal.     Comments: She has normal diabetic foot exam.  Psychiatric:        Mood and Affect: Mood normal.        Behavior: Behavior normal.        Thought Content: Thought content normal.        Judgment: Judgment normal.      Most recent functional status assessment: In your present state of health, do you have any difficulty performing the following activities: 08/03/2020  Hearing? N  Vision? Y  Difficulty concentrating or making decisions? N  Walking or climbing stairs? N  Dressing or bathing? N  Doing errands, shopping? N  Some recent data might be hidden    Most recent fall risk assessment: Fall Risk  08/03/2020  Falls in the past year? 0  Number falls in past yr: 0  Injury with Fall? 0  Risk for fall due to : No Fall Risks  Follow up Falls evaluation completed    Most recent depression screenings: PHQ 2/9 Scores 08/03/2020 07/14/2019  PHQ - 2  Score 2 0  PHQ- 9 Score 6 -   Most recent cognitive screening: 6CIT Screen 05/10/2016  What Year? 0 points  What month? 0 points  What time? 0 points  Count back from 20 0 points  Months in reverse 0 points  Repeat phrase 4 points  Total Score 4   Most recent Audit-C alcohol use screening Alcohol Use Disorder Test (AUDIT) 08/03/2020  1. How often do you have a drink containing alcohol? 0  2. How many drinks containing alcohol do you have on a typical day when you are drinking? 0  3. How often do you have six or more drinks on one occasion? 0  AUDIT-C Score 0  Alcohol Brief Interventions/Follow-up AUDIT Score <7 follow-up not indicated   A score of 3 or more in women, and 4 or more in men indicates increased risk for alcohol abuse, EXCEPT if all of the points are from question 1   No results found for any visits on 08/03/20.  Assessment & Plan     Annual wellness visit done today including the all of the following: Reviewed patient's Family Medical History Reviewed and updated list of patient's medical providers Assessment of cognitive impairment was done Assessed patient's functional ability Established a written schedule for health screening [place] Completed and Reviewed  Exercise Activities and Dietary recommendations Goals    . Exercise 3x per week (30 min per time)     Recommend to start walking at least 3 times a week for 30 minutes at a time.       Immunization History  Administered Date(s) Administered  . Fluad Quad(high Dose 65+) 04/15/2019, 05/19/2020  . Influenza, High Dose Seasonal PF 05/10/2016, 06/06/2017, 03/07/2018  . Influenza-Unspecified  04/09/2015  . PFIZER(Purple Top)SARS-COV-2 Vaccination 07/26/2019, 08/18/2019, 06/01/2020  . Pneumococcal Conjugate-13 05/10/2016  . Pneumococcal Polysaccharide-23 03/06/2012  . Td 01/23/2003    Health Maintenance  Topic Date Due  . Samul Dada  01/22/2013  . COLONOSCOPY (Pts 45-33yrs Insurance coverage will need to be confirmed)  03/31/2020  . COVID-19 Vaccine (4 - Booster for [place] series) 11/29/2020  . DEXA SCAN  03/14/2021  . INFLUENZA VACCINE  Completed  . Hepatitis C Screening  Completed  . PNA vac Low Risk Adult  Completed     Discussed health benefits of physical activity, and encouraged her to engage in regular exercise appropriate for her age and condition.    1. Encounter for Medicare annual wellness exam Physical this summer  2. Controlled type 2 diabetes mellitus without complication, without long-term current use of insulin (HCC)  - Hemoglobin A1c  3. Essential hypertension  - CBC w/Diff/Platelet - Comprehensive Metabolic Panel (CMET)  4. Pure hypercholesterolemia  - Lipid panel - TSH - Comprehensive Metabolic Panel (CMET)  5. OSA on CPAP   6. History of breast cancer    No follow-ups on file.        Erin Middleton Mon, MD  [facility] [phone] (phone) [phone] (fax)  [place]

## 2020-08-04 LAB — COMPREHENSIVE METABOLIC PANEL
ALT: 19 IU/L (ref 0–32)
AST: 18 IU/L (ref 0–40)
Albumin/Globulin Ratio: 1.5 (ref 1.2–2.2)
Albumin: 4.3 g/dL (ref 3.7–4.7)
Alkaline Phosphatase: 95 IU/L (ref 44–121)
BUN/Creatinine Ratio: 24 (ref 12–28)
BUN: 23 mg/dL (ref 8–27)
Bilirubin Total: 0.4 mg/dL (ref 0.0–1.2)
CO2: 23 mmol/L (ref 20–29)
Calcium: 9.1 mg/dL (ref 8.7–10.3)
Chloride: 104 mmol/L (ref 96–106)
Creatinine, Ser: 0.97 mg/dL (ref 0.57–1.00)
GFR calc Af Amer: 66 mL/min/{1.73_m2} (ref >59)
GFR calc non Af Amer: 57 mL/min/{1.73_m2} — ABNORMAL LOW (ref >59)
Globulin, Total: 2.8 g/dL (ref 1.5–4.5)
Glucose: 139 mg/dL — ABNORMAL HIGH (ref 65–99)
Potassium: 4.2 mmol/L (ref 3.5–5.2)
Sodium: 140 mmol/L (ref 134–144)
Total Protein: 7.1 g/dL (ref 6.0–8.5)

## 2020-08-04 LAB — CBC WITH DIFFERENTIAL/PLATELET
Basophils Absolute: 0 10*3/uL (ref 0.0–0.2)
Basos: 0 %
EOS (ABSOLUTE): 0.4 10*3/uL (ref 0.0–0.4)
Eos: 6 %
Hematocrit: 39.5 % (ref 34.0–46.6)
Hemoglobin: 13.1 g/dL (ref 11.1–15.9)
Immature Grans (Abs): 0 10*3/uL (ref 0.0–0.1)
Immature Granulocytes: 0 %
Lymphocytes Absolute: 1.8 10*3/uL (ref 0.7–3.1)
Lymphs: 24 %
MCH: 31.4 pg (ref 26.6–33.0)
MCHC: 33.2 g/dL (ref 31.5–35.7)
MCV: 95 fL (ref 79–97)
Monocytes Absolute: 0.4 10*3/uL (ref 0.1–0.9)
Monocytes: 5 %
Neutrophils Absolute: 4.8 10*3/uL (ref 1.4–7.0)
Neutrophils: 65 %
Platelets: 212 10*3/uL (ref 150–450)
RBC: 4.17 x10E6/uL (ref 3.77–5.28)
RDW: 12.2 % (ref 11.7–15.4)
WBC: 7.5 10*3/uL (ref 3.4–10.8)

## 2020-08-04 LAB — TSH: TSH: 3.39 u[IU]/mL (ref 0.450–4.500)

## 2020-08-04 LAB — LIPID PANEL
Chol/HDL Ratio: 2.1 ratio (ref 0.0–4.4)
Cholesterol, Total: 120 mg/dL (ref 100–199)
HDL: 57 mg/dL (ref >39)
LDL Chol Calc (NIH): 47 mg/dL (ref 0–99)
Triglycerides: 81 mg/dL (ref 0–149)
VLDL Cholesterol Cal: 16 mg/dL (ref 5–40)

## 2020-08-04 LAB — HEMOGLOBIN A1C
Est. average glucose Bld gHb Est-mCnc: 143 mg/dL
Hgb A1c MFr Bld: 6.6 % — ABNORMAL HIGH (ref 4.8–5.6)

## 2020-08-24 ENCOUNTER — Other Ambulatory Visit: Payer: Self-pay | Admitting: Family Medicine

## 2020-09-03 DIAGNOSIS — K219 Gastro-esophageal reflux disease without esophagitis: Secondary | ICD-10-CM | POA: Diagnosis not present

## 2020-09-03 DIAGNOSIS — Z86010 Personal history of colonic polyps: Secondary | ICD-10-CM | POA: Diagnosis not present

## 2020-09-03 DIAGNOSIS — K521 Toxic gastroenteritis and colitis: Secondary | ICD-10-CM | POA: Diagnosis not present

## 2020-09-06 DIAGNOSIS — I10 Essential (primary) hypertension: Secondary | ICD-10-CM | POA: Diagnosis not present

## 2020-09-06 DIAGNOSIS — I071 Rheumatic tricuspid insufficiency: Secondary | ICD-10-CM | POA: Diagnosis not present

## 2020-09-06 DIAGNOSIS — Z9989 Dependence on other enabling machines and devices: Secondary | ICD-10-CM | POA: Diagnosis not present

## 2020-09-06 DIAGNOSIS — I272 Pulmonary hypertension, unspecified: Secondary | ICD-10-CM | POA: Diagnosis not present

## 2020-09-06 DIAGNOSIS — R011 Cardiac murmur, unspecified: Secondary | ICD-10-CM | POA: Diagnosis not present

## 2020-09-06 DIAGNOSIS — R002 Palpitations: Secondary | ICD-10-CM | POA: Diagnosis not present

## 2020-09-06 DIAGNOSIS — G4733 Obstructive sleep apnea (adult) (pediatric): Secondary | ICD-10-CM | POA: Diagnosis not present

## 2020-09-16 ENCOUNTER — Other Ambulatory Visit: Payer: Self-pay | Admitting: Family Medicine

## 2020-09-16 NOTE — Telephone Encounter (Signed)
Requested Prescriptions  Pending Prescriptions Disp Refills  . hydrochlorothiazide (MICROZIDE) 12.5 MG capsule [Pharmacy Med Name: HYDROCHLOROTHIAZIDE 12.5 MG CP] 90 capsule 1    Sig: TAKE 1 CAPSULE BY MOUTH EVERY DAY     Cardiovascular: Diuretics - Thiazide Passed - 09/16/2020  1:48 AM      Passed - Ca in normal range and within 360 days    Calcium  Date Value Ref Range Status  08/03/2020 9.1 8.7 - 10.3 mg/dL Final   Calcium, Ion  Date Value Ref Range Status  02/10/2016 5.5 4.5 - 5.6 mg/dL Final         Passed - Cr in normal range and within 360 days    Creat  Date Value Ref Range Status  06/06/2017 0.94 (H) 0.60 - 0.93 mg/dL Final    Comment:    For patients >64 years of age, the reference limit for Creatinine is approximately 13% higher for people identified as African-American. .    Creatinine, Ser  Date Value Ref Range Status  08/03/2020 0.97 0.57 - 1.00 mg/dL Final         Passed - K in normal range and within 360 days    Potassium  Date Value Ref Range Status  08/03/2020 4.2 3.5 - 5.2 mmol/L Final         Passed - Na in normal range and within 360 days    Sodium  Date Value Ref Range Status  08/03/2020 140 134 - 144 mmol/L Final         Passed - Last BP in normal range    BP Readings from Last 1 Encounters:  08/03/20 (!) 112/56         Passed - Valid encounter within last 6 months    Recent Outpatient Visits          1 month ago Encounter for [employer] annual wellness exam   [facility] Jerrol Banana., MD   6 months ago Essential hypertension   [facility] Jerrol Banana., MD   10 months ago Essential hypertension   [facility] Jerrol Banana., MD   1 year ago Annual physical exam   [facility] Jerrol Banana., MD   2 years ago Essential hypertension   [facility] Jerrol Banana., MD      Future Appointments            In 4 months  Jerrol Banana., MD [facility] [place]

## 2020-10-12 ENCOUNTER — Other Ambulatory Visit: Payer: Self-pay | Admitting: Family Medicine

## 2020-10-12 NOTE — Telephone Encounter (Signed)
Future visit in 3 months  

## 2020-11-09 ENCOUNTER — Ambulatory Visit: Payer: Medicare PPO | Admitting: Anesthesiology

## 2020-11-09 ENCOUNTER — Encounter
Admission: RE | Disposition: A | Discharge status: Home / Self Care | Payer: Self-pay | Source: Home / Self Care | Attending: Gastroenterology

## 2020-11-09 ENCOUNTER — Encounter: Payer: Self-pay | Admitting: Anesthesiology

## 2020-11-09 ENCOUNTER — Other Ambulatory Visit: Payer: Self-pay

## 2020-11-09 ENCOUNTER — Ambulatory Visit
Admission: RE | Admit: 2020-11-09 | Discharge: 2020-11-09 | Disposition: A | Discharge status: Home / Self Care | Payer: Medicare PPO | Attending: Gastroenterology | Admitting: Gastroenterology

## 2020-11-09 DIAGNOSIS — I10 Essential (primary) hypertension: Secondary | ICD-10-CM | POA: Insufficient documentation

## 2020-11-09 DIAGNOSIS — Z9221 Personal history of antineoplastic chemotherapy: Secondary | ICD-10-CM | POA: Diagnosis not present

## 2020-11-09 DIAGNOSIS — Z1211 Encounter for screening for malignant neoplasm of colon: Secondary | ICD-10-CM | POA: Insufficient documentation

## 2020-11-09 DIAGNOSIS — Z791 Long term (current) use of non-steroidal anti-inflammatories (NSAID): Secondary | ICD-10-CM | POA: Diagnosis not present

## 2020-11-09 DIAGNOSIS — Z79899 Other long term (current) drug therapy: Secondary | ICD-10-CM | POA: Diagnosis not present

## 2020-11-09 DIAGNOSIS — Z86010 Personal history of colonic polyps: Secondary | ICD-10-CM | POA: Diagnosis not present

## 2020-11-09 DIAGNOSIS — Z23 Encounter for immunization: Secondary | ICD-10-CM | POA: Insufficient documentation

## 2020-11-09 DIAGNOSIS — D124 Benign neoplasm of descending colon: Secondary | ICD-10-CM | POA: Insufficient documentation

## 2020-11-09 DIAGNOSIS — K635 Polyp of colon: Secondary | ICD-10-CM | POA: Diagnosis not present

## 2020-11-09 DIAGNOSIS — E785 Hyperlipidemia, unspecified: Secondary | ICD-10-CM | POA: Insufficient documentation

## 2020-11-09 DIAGNOSIS — K219 Gastro-esophageal reflux disease without esophagitis: Secondary | ICD-10-CM | POA: Diagnosis not present

## 2020-11-09 DIAGNOSIS — Z7984 Long term (current) use of oral hypoglycemic drugs: Secondary | ICD-10-CM | POA: Insufficient documentation

## 2020-11-09 DIAGNOSIS — Z853 Personal history of malignant neoplasm of breast: Secondary | ICD-10-CM | POA: Diagnosis not present

## 2020-11-09 DIAGNOSIS — K5730 Diverticulosis of large intestine without perforation or abscess without bleeding: Secondary | ICD-10-CM | POA: Diagnosis not present

## 2020-11-09 HISTORY — PX: COLONOSCOPY WITH PROPOFOL: SHX5780

## 2020-11-09 LAB — GLUCOSE, CAPILLARY: Glucose-Capillary: 142 mg/dL — ABNORMAL HIGH (ref 70–99)

## 2020-11-09 SURGERY — COLONOSCOPY WITH PROPOFOL
Anesthesia: General

## 2020-11-09 MED ORDER — PROPOFOL 500 MG/50ML IV EMUL
INTRAVENOUS | Status: DC | PRN
  Administered 2020-11-09: 120 ug/kg/min via INTRAVENOUS

## 2020-11-09 MED ORDER — PROPOFOL 500 MG/50ML IV EMUL
INTRAVENOUS | Status: AC
  Filled 2020-11-09: qty 50

## 2020-11-09 MED ORDER — SODIUM CHLORIDE 0.9 % IV SOLN: INTRAVENOUS | Status: DC

## 2020-11-09 NOTE — Anesthesia Preprocedure Evaluation (Addendum)
Anesthesia Evaluation  Patient identified by MRN, date of birth, ID band Patient awake    Reviewed: Allergy & Precautions, H&P , NPO status , Patient's Chart, lab work & pertinent test results, reviewed documented beta blocker date and time   History of Anesthesia Complications Negative for: history of anesthetic complications  Airway Mallampati: II  TM Distance: >3 FB Neck ROM: full    Dental no notable dental hx. (+) Teeth Intact   Pulmonary sleep apnea ,    Pulmonary exam normal breath sounds clear to auscultation       Cardiovascular Exercise Tolerance: Good hypertension, On Medications (-) angina(-) CAD, (-) Past MI, (-) Cardiac Stents and (-) CABG Normal cardiovascular exam(-) dysrhythmias (-) Valvular Problems/Murmurs Rhythm:regular Rate:Normal     Neuro/Psych  Headaches, negative psych ROS   GI/Hepatic Neg liver ROS, GERD  Controlled and Medicated,  Endo/Other  negative endocrine ROS  Renal/GU negative Renal ROS  negative genitourinary   Musculoskeletal  (+) Arthritis , Osteoarthritis,    Abdominal   Peds negative pediatric ROS (+)  Hematology negative hematology ROS (+)   Anesthesia Other Findings Past Medical History:   Arthritis                                                    Cancer                                                         Comment:Breast   GERD (gastroesophageal reflux disease)                       Hypertension                                                 Reproductive/Obstetrics negative OB ROS                           Anesthesia Physical  Anesthesia Plan  ASA: II  Anesthesia Plan: General   Post-op Pain Management:    Induction:   PONV Risk Score and Plan: Propofol infusion and TIVA  Airway Management Planned: Nasal Cannula  Additional Equipment:   Intra-op Plan:   Post-operative Plan:   Informed Consent: I have reviewed the patients  History and Physical, chart, labs and discussed the procedure including the risks, benefits and alternatives for the proposed anesthesia with the patient or authorized representative who has indicated his/her understanding and acceptance.     Dental Advisory Given  Plan Discussed with: Anesthesiologist, CRNA and Surgeon  Anesthesia Plan Comments:         Anesthesia Quick Evaluation

## 2020-11-09 NOTE — Op Note (Signed)
[facility] Gastroenterology Patient Name: Erin Middleton Procedure Date: 11/09/2020 10:30 AM MRN: [id number] Account #: [id number] Date of Birth: 1946/01/13 Admit Type: Outpatient Age: 75 Room: [facility] ENDO ROOM 1 Gender: Female Note Status: Finalized Procedure:             Colonoscopy Indications:           High risk colon cancer surveillance: Personal history                         of colonic polyps Providers:             Andrey Farmer MD, MD Referring MD:          Janine Ores. Rosanna [name], MD (Referring MD) Medicines:             Monitored Anesthesia Care Complications:         No immediate complications. Estimated blood loss:                         Minimal. Procedure:             Pre-Anesthesia Assessment:                        - Prior to the procedure, a History and Physical was                         performed, and patient medications and allergies were                         reviewed. The patient is competent. The risks and                         benefits of the procedure and the sedation options and                         risks were discussed with the patient. All questions                         were answered and informed consent was obtained.                         Patient identification and proposed procedure were                         verified by the physician, the nurse, the anesthetist                         and the technician in the endoscopy suite. Mental                         Status Examination: alert and oriented. Airway                         Examination: normal oropharyngeal airway and neck                         mobility. Respiratory Examination: clear to  auscultation. CV Examination: normal. Prophylactic                         Antibiotics: The patient does not require prophylactic                         antibiotics. Prior Anticoagulants: The patient has                         taken no previous  anticoagulant or antiplatelet                         agents. ASA Grade Assessment: II - A patient with mild                         systemic disease. After reviewing the risks and                         benefits, the patient was deemed in satisfactory                         condition to undergo the procedure. The anesthesia                         plan was to use monitored anesthesia care (MAC).                         Immediately prior to administration of medications,                         the patient was re-assessed for adequacy to receive                         sedatives. The heart rate, respiratory rate, oxygen                         saturations, blood pressure, adequacy of pulmonary                         ventilation, and response to care were monitored                         throughout the procedure. The physical status of the                         patient was re-assessed after the procedure.                        After obtaining informed consent, the colonoscope was                         passed under direct vision. Throughout the procedure,                         the patient's blood pressure, pulse, and oxygen                         saturations were monitored continuously. The  Colonoscope was introduced through the anus and                         advanced to the the cecum, identified by appendiceal                         orifice and ileocecal valve. The colonoscopy was                         performed without difficulty. The patient tolerated                         the procedure well. The quality of the bowel                         preparation was good. Findings:      The perianal and digital rectal examinations were normal.      A 1 mm polyp was found in the descending colon. The polyp was sessile.       The polyp was removed with a jumbo cold forceps. Resection and retrieval       were complete. Estimated blood loss was minimal.       Many small-mouthed diverticula were found in the sigmoid colon.      The exam was otherwise without abnormality on direct and retroflexion       views. Impression:            - One 1 mm polyp in the descending colon, removed with                         a jumbo cold forceps. Resected and retrieved.                        - Diverticulosis in the sigmoid colon.                        - The examination was otherwise normal on direct and                         retroflexion views. Recommendation:        - Discharge patient to home.                        - Resume previous diet.                        - Continue present medications.                        - Await pathology results.                        - Repeat colonoscopy is not recommended due to current                         age (77 years or older) for surveillance.                        - Return to referring physician as previously  scheduled. Procedure Code(s):     --- Professional ---                        [phone], Colonoscopy, flexible; with biopsy, single or                         multiple Diagnosis Code(s):     --- Professional ---                        Z86.010, Personal history of colonic polyps                        K63.5, Polyp of colon                        K57.30, Diverticulosis of large intestine without                         perforation or abscess without bleeding CPT copyright 2019 American Medical Association. All rights reserved. The codes documented in this report are preliminary and upon coder review may  be revised to meet current compliance requirements. Andrey Farmer MD, MD 11/09/2020 11:09:18 AM Number of Addenda: 0 Note Initiated On: 11/09/2020 10:30 AM Scope Withdrawal Time: 0 hours 6 minutes 37 seconds  Total Procedure Duration: 0 hours 12 minutes 52 seconds  Estimated Blood Loss:  Estimated blood loss was minimal.      [facility]

## 2020-11-09 NOTE — Transfer of Care (Signed)
Immediate Anesthesia Transfer of Care Note  Patient: Erin Middleton  Procedure(s) Performed: COLONOSCOPY WITH PROPOFOL (N/A )  Patient Location: PACU  Anesthesia Type:General  Level of Consciousness: awake and sedated  Airway & Oxygen Therapy: Patient Spontanous Breathing and Patient connected to nasal cannula oxygen  Post-op Assessment: Report given to RN and Post -op Vital signs reviewed and stable  Post vital signs: Reviewed and stable  Last Vitals:  Vitals Value Taken Time  BP    Temp    Pulse    Resp    SpO2      Last Pain:  Vitals:   11/09/20 1032  TempSrc: Temporal  PainSc: 0-No pain         Complications: No complications documented.

## 2020-11-09 NOTE — Anesthesia Postprocedure Evaluation (Signed)
Anesthesia Post Note  Patient: ETSUKO DIEROLF  Procedure(s) Performed: COLONOSCOPY WITH PROPOFOL (N/A )  Patient location during evaluation: Endoscopy Anesthesia Type: General Level of consciousness: awake and alert and oriented Pain management: pain level controlled Vital Signs Assessment: post-procedure vital signs reviewed and stable Respiratory status: spontaneous breathing Cardiovascular status: blood pressure returned to baseline Anesthetic complications: no   No complications documented.   Last Vitals:  Vitals:   11/09/20 1032 11/09/20 1110  BP: 132/70 (!) 133/58  Pulse: 72 79  Resp: 16 (!) 21  Temp: (!) 36.3 C (!) 36.4 C  SpO2: 99% 95%    Last Pain:  Vitals:   11/09/20 1140  TempSrc:   PainSc: 0-No pain                 Aviah Sorci

## 2020-11-09 NOTE — Interval H&P Note (Signed)
History and Physical Interval Note:  11/09/2020 10:50 AM  Erin Middleton  has presented today for surgery, with the diagnosis of P HX POLYPS.  The various methods of treatment have been discussed with the patient and family. After consideration of risks, benefits and other options for treatment, the patient has consented to  Procedure(s): COLONOSCOPY WITH PROPOFOL (N/A) as a surgical intervention.  The patient's history has been reviewed, patient examined, no change in status, stable for surgery.  I have reviewed the patient's chart and labs.  Questions were answered to the patient's satisfaction.     Lesly Rubenstein  Ok to proceed with colonoscopy

## 2020-11-09 NOTE — H&P (Signed)
Outpatient short stay form Pre-procedure 11/09/2020 10:45 AM Erin Middleton, MPH  Primary Physician: Dr. Rosanna Middleton  Reason for visit:  Personal History of polyps  History of present illness:   75 y/o lady with history of hypertension, hyperlipidemia, and GERD with a small TA on previous colonoscopies. No family history of GI malignancies. No abdominal surgeries and no blood thinners.    Current Facility-Administered Medications:  .  0.9 %  sodium chloride infusion, , Intravenous, Continuous, Paz Fuentes, Hilton Cork, Middleton  Medications Prior to Admission  Medication Sig Dispense Refill Last Dose  . amLODipine (NORVASC) 5 MG tablet TAKE 1 TABLET BY MOUTH EVERY DAY IN THE EVENING 90 tablet 2 11/08/2020 at Unknown time  . hydrochlorothiazide (MICROZIDE) 12.5 MG capsule TAKE 1 CAPSULE BY MOUTH EVERY DAY 90 capsule 1 11/08/2020 at Unknown time  . Multiple Vitamins-Minerals (PRESERVISION/LUTEIN) CAPS Take by mouth 2 (two) times daily. Reported on 09/15/2015   11/08/2020 at Unknown time  . omeprazole (PRILOSEC) 20 MG capsule TAKE 1 CAPSULE BY MOUTH EVERY DAY IN THE MORNING 90 capsule 4 11/08/2020 at Unknown time  . rosuvastatin (CRESTOR) 10 MG tablet Take 10 mg by mouth daily.   11/08/2020 at Unknown time  . telmisartan (MICARDIS) 80 MG tablet TAKE 1 TABLET BY MOUTH DAILY 90 tablet 0 11/08/2020 at Unknown time  . ACCU-CHEK GUIDE test strip CHECK BLOOD SUGAR EVERY DAY 100 strip 2   . Accu-Chek Softclix Lancets lancets CHECK BLOOD SUGARL ONCE DAILY 100 each 0   . blood glucose meter kit and supplies Dispense based on patient and insurance preference. Check blood sugar once a day (FOR ICD-10 E10.9, E11.9). 1 each 0   . Cholecalciferol (VITAMIN D PO) Take by mouth 2 (two) times daily. Pt unsure of dosage.     Marland Kitchen COVID-19 mRNA vaccine, Pfizer, 30 MCG/0.3ML injection USE AS DIRECTED .3 mL 0   . metFORMIN (GLUCOPHAGE) 500 MG tablet Take 1 tablet (500 mg total) by mouth daily with supper. 90 tablet 3 11/04/2020  . naproxen  (NAPROSYN) 375 MG tablet Take 1 tablet (375 mg total) by mouth 2 (two) times daily with a meal. 60 tablet 1   . vitamin E 400 UNIT capsule Take 400 Units by mouth daily. Reported on 09/15/2015        Not on File   Past Medical History:  Diagnosis Date  . Arthritis   . Breast cancer ([place]) 1985   left breast cancer - chemotherapy  . Cancer ([facility])    Breast  . GERD (gastroesophageal reflux disease)   . Headache    migraines h/o  . Hypertension   . Personal history of chemotherapy     Review of systems:  Otherwise negative.    Physical Exam  Gen: Alert, oriented. Appears stated age.  HEENT: PERRLA. Lungs: No respiratory distress CV: RRR Abd: soft, benign, no masses Ext: No edema    Planned procedures: Proceed with colonoscopy. The patient understands the nature of the planned procedure, indications, risks, alternatives and potential complications including but not limited to bleeding, infection, perforation, damage to internal organs and possible oversedation/side effects from anesthesia. The patient agrees and gives consent to proceed.  Please refer to procedure notes for findings, recommendations and patient disposition/instructions.     Erin Middleton, MPH Gastroenterology 11/09/2020  10:45 AM

## 2020-11-09 NOTE — Anesthesia Procedure Notes (Signed)
Performed by: Cook-Martin, Alexea Blase Pre-anesthesia Checklist: Patient identified, Emergency Drugs available, Suction available, Patient being monitored and Timeout performed Patient Re-evaluated:Patient Re-evaluated prior to induction Oxygen Delivery Method: Nasal cannula Preoxygenation: Pre-oxygenation with 100% oxygen Induction Type: IV induction Airway Equipment and Method: Bite block Placement Confirmation: positive ETCO2 and CO2 detector       

## 2020-11-10 ENCOUNTER — Encounter: Payer: Self-pay | Admitting: Gastroenterology

## 2020-11-10 LAB — SURGICAL PATHOLOGY

## 2020-11-18 ENCOUNTER — Encounter (INDEPENDENT_AMBULATORY_CARE_PROVIDER_SITE_OTHER): Payer: Medicare PPO | Admitting: Ophthalmology

## 2020-11-18 ENCOUNTER — Other Ambulatory Visit: Payer: Self-pay

## 2020-11-18 DIAGNOSIS — H353132 Nonexudative age-related macular degeneration, bilateral, intermediate dry stage: Secondary | ICD-10-CM

## 2020-11-18 DIAGNOSIS — I10 Essential (primary) hypertension: Secondary | ICD-10-CM

## 2020-11-18 DIAGNOSIS — H43813 Vitreous degeneration, bilateral: Secondary | ICD-10-CM

## 2020-11-18 DIAGNOSIS — H35033 Hypertensive retinopathy, bilateral: Secondary | ICD-10-CM

## 2020-12-01 DIAGNOSIS — I10 Essential (primary) hypertension: Secondary | ICD-10-CM | POA: Diagnosis not present

## 2020-12-01 DIAGNOSIS — R011 Cardiac murmur, unspecified: Secondary | ICD-10-CM | POA: Diagnosis not present

## 2020-12-01 DIAGNOSIS — I272 Pulmonary hypertension, unspecified: Secondary | ICD-10-CM | POA: Diagnosis not present

## 2020-12-01 DIAGNOSIS — R002 Palpitations: Secondary | ICD-10-CM | POA: Diagnosis not present

## 2020-12-01 DIAGNOSIS — R0789 Other chest pain: Secondary | ICD-10-CM | POA: Diagnosis not present

## 2020-12-01 DIAGNOSIS — Z9989 Dependence on other enabling machines and devices: Secondary | ICD-10-CM | POA: Diagnosis not present

## 2020-12-01 DIAGNOSIS — G4733 Obstructive sleep apnea (adult) (pediatric): Secondary | ICD-10-CM | POA: Diagnosis not present

## 2020-12-21 ENCOUNTER — Observation Stay
Admission: EM | Admit: 2020-12-21 | Discharge: 2020-12-22 | Disposition: A | Discharge status: Home / Self Care | Payer: Medicare PPO | Attending: Obstetrics and Gynecology | Admitting: Obstetrics and Gynecology

## 2020-12-21 ENCOUNTER — Ambulatory Visit: Payer: Medicare PPO | Admitting: Internal Medicine

## 2020-12-21 ENCOUNTER — Observation Stay: Payer: Medicare PPO

## 2020-12-21 ENCOUNTER — Emergency Department: Payer: Medicare PPO

## 2020-12-21 ENCOUNTER — Observation Stay
Admit: 2020-12-21 | Discharge: 2020-12-21 | Disposition: A | Discharge status: Home / Self Care | Payer: Medicare PPO | Attending: Internal Medicine | Admitting: Internal Medicine

## 2020-12-21 ENCOUNTER — Other Ambulatory Visit: Payer: Self-pay

## 2020-12-21 ENCOUNTER — Encounter: Payer: Self-pay | Admitting: Internal Medicine

## 2020-12-21 VITALS — BP 126/78 | HR 69 | Temp 97.5°F | Ht 63.0 in | Wt 160.8 lb

## 2020-12-21 DIAGNOSIS — R2241 Localized swelling, mass and lump, right lower limb: Secondary | ICD-10-CM | POA: Insufficient documentation

## 2020-12-21 DIAGNOSIS — E119 Type 2 diabetes mellitus without complications: Secondary | ICD-10-CM | POA: Diagnosis not present

## 2020-12-21 DIAGNOSIS — Z9989 Dependence on other enabling machines and devices: Secondary | ICD-10-CM | POA: Diagnosis not present

## 2020-12-21 DIAGNOSIS — M7989 Other specified soft tissue disorders: Secondary | ICD-10-CM | POA: Diagnosis not present

## 2020-12-21 DIAGNOSIS — T671XXA Heat syncope, initial encounter: Secondary | ICD-10-CM | POA: Diagnosis not present

## 2020-12-21 DIAGNOSIS — Q231 Congenital insufficiency of aortic valve: Secondary | ICD-10-CM | POA: Insufficient documentation

## 2020-12-21 DIAGNOSIS — I2721 Secondary pulmonary arterial hypertension: Secondary | ICD-10-CM | POA: Diagnosis not present

## 2020-12-21 DIAGNOSIS — Z20822 Contact with and (suspected) exposure to covid-19: Secondary | ICD-10-CM | POA: Diagnosis not present

## 2020-12-21 DIAGNOSIS — M722 Plantar fascial fibromatosis: Secondary | ICD-10-CM | POA: Diagnosis present

## 2020-12-21 DIAGNOSIS — G4733 Obstructive sleep apnea (adult) (pediatric): Secondary | ICD-10-CM | POA: Diagnosis not present

## 2020-12-21 DIAGNOSIS — K219 Gastro-esophageal reflux disease without esophagitis: Secondary | ICD-10-CM

## 2020-12-21 DIAGNOSIS — Z79899 Other long term (current) drug therapy: Secondary | ICD-10-CM | POA: Diagnosis not present

## 2020-12-21 DIAGNOSIS — Z96652 Presence of left artificial knee joint: Secondary | ICD-10-CM | POA: Insufficient documentation

## 2020-12-21 DIAGNOSIS — F4321 Adjustment disorder with depressed mood: Secondary | ICD-10-CM

## 2020-12-21 DIAGNOSIS — R55 Syncope and collapse: Secondary | ICD-10-CM | POA: Diagnosis not present

## 2020-12-21 DIAGNOSIS — Z7984 Long term (current) use of oral hypoglycemic drugs: Secondary | ICD-10-CM | POA: Insufficient documentation

## 2020-12-21 DIAGNOSIS — I10 Essential (primary) hypertension: Secondary | ICD-10-CM | POA: Diagnosis not present

## 2020-12-21 DIAGNOSIS — Z853 Personal history of malignant neoplasm of breast: Secondary | ICD-10-CM | POA: Insufficient documentation

## 2020-12-21 LAB — BASIC METABOLIC PANEL
Anion gap: 9 (ref 5–15)
BUN: 30 mg/dL — ABNORMAL HIGH (ref 8–23)
CO2: 24 mmol/L (ref 22–32)
Calcium: 9.4 mg/dL (ref 8.9–10.3)
Chloride: 104 mmol/L (ref 98–111)
Creatinine, Ser: 0.85 mg/dL (ref 0.44–1.00)
GFR, Estimated: >60 mL/min (ref >60)
Glucose, Bld: 155 mg/dL — ABNORMAL HIGH (ref 70–99)
Potassium: 3.9 mmol/L (ref 3.5–5.1)
Sodium: 137 mmol/L (ref 135–145)

## 2020-12-21 LAB — URINALYSIS, COMPLETE (UACMP) WITH MICROSCOPIC
Bilirubin Urine: NEGATIVE
Glucose, UA: NEGATIVE mg/dL
Hgb urine dipstick: NEGATIVE
Ketones, ur: NEGATIVE mg/dL
Leukocytes,Ua: NEGATIVE
Nitrite: NEGATIVE
Protein, ur: NEGATIVE mg/dL
Specific Gravity, Urine: 1.013 (ref 1.005–1.030)
pH: 5 (ref 5.0–8.0)

## 2020-12-21 LAB — CBC
HCT: 40.7 % (ref 36.0–46.0)
Hemoglobin: 13.9 g/dL (ref 12.0–15.0)
MCH: 31.7 pg (ref 26.0–34.0)
MCHC: 34.2 g/dL (ref 30.0–36.0)
MCV: 92.9 fL (ref 80.0–100.0)
Platelets: 211 10*3/uL (ref 150–400)
RBC: 4.38 MIL/uL (ref 3.87–5.11)
RDW: 13.1 % (ref 11.5–15.5)
WBC: 8.8 10*3/uL (ref 4.0–10.5)
nRBC: 0 % (ref 0.0–0.2)

## 2020-12-21 LAB — RESP PANEL BY RT-PCR (FLU A&B, COVID) ARPGX2
Influenza A by PCR: NEGATIVE
Influenza B by PCR: NEGATIVE
SARS Coronavirus 2 by RT PCR: NEGATIVE

## 2020-12-21 LAB — TROPONIN I (HIGH SENSITIVITY)
Troponin I (High Sensitivity): 7 ng/L (ref <18)
Troponin I (High Sensitivity): 7 ng/L (ref <18)

## 2020-12-21 LAB — VITAMIN B12: Vitamin B-12: 367 pg/mL (ref 180–914)

## 2020-12-21 LAB — D-DIMER, QUANTITATIVE: D-Dimer, Quant: 0.35 ug/mL-FEU (ref 0.00–0.50)

## 2020-12-21 LAB — TSH: TSH: 4.267 u[IU]/mL (ref 0.350–4.500)

## 2020-12-21 MED ORDER — IRBESARTAN 150 MG PO TABS
300.0000 mg | ORAL_TABLET | Freq: Every day | ORAL | Status: DC
  Filled 2020-12-21: qty 2

## 2020-12-21 MED ORDER — AMLODIPINE BESYLATE 5 MG PO TABS
5.0000 mg | ORAL_TABLET | Freq: Every day | ORAL | Status: DC
  Administered 2020-12-21: 5 mg via ORAL
  Filled 2020-12-21: qty 1

## 2020-12-21 MED ORDER — SODIUM CHLORIDE 0.9% FLUSH
3.0000 mL | Freq: Two times a day (BID) | INTRAVENOUS | Status: DC
  Administered 2020-12-22: 3 mL via INTRAVENOUS

## 2020-12-21 MED ORDER — ONDANSETRON HCL 4 MG PO TABS: 4.0000 mg | ORAL_TABLET | Freq: Four times a day (QID) | ORAL | Status: DC | PRN

## 2020-12-21 MED ORDER — ENOXAPARIN SODIUM 40 MG/0.4ML IJ SOSY
40.0000 mg | PREFILLED_SYRINGE | INTRAMUSCULAR | Status: DC
  Administered 2020-12-21: 40 mg via SUBCUTANEOUS
  Filled 2020-12-21: qty 0.4

## 2020-12-21 MED ORDER — PANTOPRAZOLE SODIUM 40 MG PO TBEC
40.0000 mg | DELAYED_RELEASE_TABLET | Freq: Every day | ORAL | Status: DC
  Administered 2020-12-21 – 2020-12-22 (×2): 40 mg via ORAL
  Filled 2020-12-21 (×2): qty 1

## 2020-12-21 MED ORDER — HYDROCHLOROTHIAZIDE 12.5 MG PO CAPS: 12.5000 mg | ORAL_CAPSULE | Freq: Every day | ORAL | Status: DC

## 2020-12-21 MED ORDER — ROSUVASTATIN CALCIUM 20 MG PO TABS
10.0000 mg | ORAL_TABLET | Freq: Every day | ORAL | Status: DC
  Administered 2020-12-21: 10 mg via ORAL
  Filled 2020-12-21: qty 1

## 2020-12-21 MED ORDER — METFORMIN HCL 500 MG PO TABS
500.0000 mg | ORAL_TABLET | Freq: Every day | ORAL | Status: DC
  Administered 2020-12-21: 500 mg via ORAL
  Filled 2020-12-21: qty 1

## 2020-12-21 MED ORDER — ONDANSETRON HCL 4 MG/2ML IJ SOLN: 4.0000 mg | Freq: Four times a day (QID) | INTRAMUSCULAR | Status: DC | PRN

## 2020-12-21 MED ORDER — ACETAMINOPHEN 650 MG RE SUPP
650.0000 mg | Freq: Four times a day (QID) | RECTAL | Status: DC | PRN
Start: 2020-12-21 — End: 2020-12-22

## 2020-12-21 MED ORDER — ACETAMINOPHEN 325 MG PO TABS: 650.0000 mg | ORAL_TABLET | Freq: Four times a day (QID) | ORAL | Status: DC | PRN

## 2020-12-21 NOTE — ED Notes (Signed)
Patient transported to X-ray 

## 2020-12-21 NOTE — ED Notes (Signed)
Patient is resting comfortably. Daughter at bedside. Patient updated on POC. Patient awaiting inpatient admission.

## 2020-12-21 NOTE — ED Notes (Signed)
Rainbow sent to lab at this time.  

## 2020-12-21 NOTE — ED Notes (Signed)
Pt presents to the ED for a syncopal episode yesterday. States that she was walking with a pile of clothes and just went out. Pt states she did not feel any right before the episode but felt weak immediately after. Pt states she denies head injury and unknown how long she was unconscious. Pt thinks maybe a few seconds. On arrival, pt is ambulatory and alert and oriented. Denies any injury after the fall able to move all extremities freely.

## 2020-12-21 NOTE — Progress Notes (Signed)
@Patient  ID: Erin Middleton, female    DOB: 01-24-1946, 75 y.o.   MRN: [id number]  Chief Complaint  Patient presents with   Follow-up    OSA    HPI:  75 year old female never smoker followed in our office for obstructive sleep apnea   12/21/2020  - Visit  Patient diagnosed with severe sleep apnea Serial echocardiogram showed EF 55% with right ventricular systolic pressure 50 Patient sees cardiology and was supposed to get assessment for stress test next week  However patient had syncopal episode yesterday without any warning Patient states she is not feeling well Patient was advised to go to the ER yesterday but did not and refused    No evidence of heart failure at this time No evidence or signs of infection at this time No respiratory distress No fevers, chills, nausea, vomiting, diarrhea No evidence of lower extremity edema No evidence hemoptysis   Patient CPAP compliance report today shows adequate compliance.  See compliance report listed below:  12/23/2019-01/21/2020-CPAP compliance report-22 out of last 30 days used, 16 of those days greater than 4 hours, average usage 4 hours and 58 minutes, APAP setting 5-20, AHI 1.3  10/2020 Compliance report 97% for days 9% for greater than 4 hours AHI reduced to 1.2 previous AHI of 22     Patient's home sleep study which was done in May/2020 showed moderate obstructive sleep apnea with an AHI of 22.6, SaO2 low 66%   CBC    Component Value Date/Time   WBC 7.5 08/03/2020 1120   WBC 7.5 06/06/2017 1120   RBC 4.17 08/03/2020 1120   RBC 4.22 06/06/2017 1120   HGB 13.1 08/03/2020 1120   HCT 39.5 08/03/2020 1120   PLT 212 08/03/2020 1120   MCV 95 08/03/2020 1120   MCH 31.4 08/03/2020 1120   MCH 32.0 06/06/2017 1120   MCHC 33.2 08/03/2020 1120   MCHC 34.7 06/06/2017 1120   RDW 12.2 08/03/2020 1120   LYMPHSABS 1.8 08/03/2020 1120   MONOABS 0.5 09/08/2015 1427   EOSABS 0.4 08/03/2020 1120   BASOSABS 0.0 08/03/2020 1120     BMET    Component Value Date/Time   NA 140 08/03/2020 1120   K 4.2 08/03/2020 1120   CL 104 08/03/2020 1120   CO2 23 08/03/2020 1120   GLUCOSE 139 (H) 08/03/2020 1120   GLUCOSE 113 (H) 06/06/2017 1120   BUN 23 08/03/2020 1120   CREATININE 0.97 08/03/2020 1120   CREATININE 0.94 (H) 06/06/2017 1120   CALCIUM 9.1 08/03/2020 1120   GFRNONAA 57 (L) 08/03/2020 1120   GFRNONAA 60 06/06/2017 1120   GFRAA 66 08/03/2020 1120   GFRAA 70 06/06/2017 1120    BNP    Component Value Date/Time   BNP 81.0 09/19/2018 1120      No Known Allergies  Immunization History  Administered Date(s) Administered   Fluad Quad(high Dose 65+) 04/15/2019, 05/19/2020   Influenza, High Dose Seasonal PF 05/10/2016, 06/06/2017, 03/07/2018   Influenza-Unspecified 04/09/2015   PFIZER(Purple Top)SARS-COV-2 Vaccination 07/26/2019, 08/18/2019, 06/01/2020   Pneumococcal Conjugate-13 05/10/2016   Pneumococcal Polysaccharide-23 03/06/2012   Td 01/23/2003    Past Medical History:  Diagnosis Date   Arthritis    Breast cancer ([place]) 1985   left breast cancer - chemotherapy   Cancer ([place])    Breast   GERD (gastroesophageal reflux disease)    Headache    migraines h/o   Hypertension    Personal history of chemotherapy     Review of Systems  Review of Systems  Constitutional:  Positive for fatigue. Negative for activity change and fever.  HENT:  Negative for sinus pressure, sinus pain and sore throat.   Respiratory:  Positive for shortness of breath. Negative for cough and wheezing.   Cardiovascular:  Negative for chest pain and palpitations.  Gastrointestinal:  Negative for diarrhea, nausea and vomiting.  Musculoskeletal:  Negative for arthralgias.  Neurological:  Positive for syncope. Negative for dizziness.  Psychiatric/Behavioral:  Negative for sleep disturbance. The patient is not nervous/anxious.     BP 126/78 (BP Location: Left Arm, Patient Position: Sitting, Cuff Size: Normal)   Pulse 69    Temp (!) 97.5 F (36.4 C) (Oral)   Ht 5\' 3"  (1.6 m)   Wt 160 lb 12.8 oz (72.9 kg)   SpO2 99%   BMI 28.48 kg/m    Physical Examination:   General Appearance: No distress  Neuro:without focal findings,  speech normal,  HEENT: PERRLA, EOM intact.   Pulmonary: normal breath sounds, No wheezing.  CardiovascularNormal S1,S2.  No m/r/g.   Abdomen: Benign, Soft, non-tender. Renal:  No costovertebral tenderness  GU:  Not performed at this time. Endoc: No evident thyromegaly Skin:   warm, no rashes, no ecchymosis  Extremities: normal, no cyanosis, clubbing. PSYCHIATRIC: Mood, affect within normal limits.   ALL OTHER ROS ARE NEGATIVE     Assessment & Plan:   75 year old pleasant white female seen today for follow-up assessment for OSA Compliance report reviewed with patient patient has excellent compliance report patient uses benefits from CPAP therapy auto CPAP 5 to 20 cm of water pressure AHI is reduced to 1.2 previous AHI was 22  Patient did have syncopal episode yesterday and she states she is not feeling well She had seen her cardiologist Dr. Ubaldo Glassing in the past She was advised to go to the ER and SHE REFUSED  I HAVE RECOMMENDED THAT PATIENT GO TO ER NOW FOR FURTHER ASSESSMENT  I HAVE DISCUSSED CASE WITH DR Corry ALSO RECOMMEND ER ASSESSMENT AND EVALUATION   PAH (pulmonary artery hypertension) ([place]) Previously seen on prior echocardiograms Most likely related to sleep apnea   MEDICATION ADJUSTMENTS/LABS AND TESTS ORDERED:  CURRENT MEDICATIONS REVIEWED AT LENGTH WITH PATIENT TODAY Continue CPAP as prescribed Go to ER for further evaluation for syncopal episode  Patient/Family are satisfied with Plan of action and management. All questions answered  Follow up 6 months  Total time spent 35 mins  Corrin Parker, M.D.  [facility]  Medical Director [facility] Cardio-Pulmonary Department

## 2020-12-21 NOTE — ED Notes (Signed)
Patient transported to CT 

## 2020-12-21 NOTE — H&P (Signed)
History and Physical   Erin Middleton DHR:[id number] DOB: May 05, 1946 DOA: 12/21/2020  PCP: Jerrol Banana., MD  Outpatient Specialists: Dr. Haig Prophet, gastroenterology Patient coming from: Home  I have personally briefly reviewed patient's old medical records in [place].  Chief Concern: Syncope  HPI: Erin Middleton is a 75 y.o. female with medical history significant for hypertension, hyperlipidemia, non-insulin-dependent diabetes mellitus, GERD, pulmonary hypertension, on CPAP at night, presents emergency department for chief concerns of syncope.  She was walking with small amount of laundry in her hand and she passed out. Suddenly she felt off and knew something was going to happen and the next thing she knew she was laying on the floor. She woke up laying on her right side.   She endorses lost of consciousness however does not know how long she was out for.  She does not feel like she was out for very long.  She denies chest pain, shortness of breath, abdominal pain, nausea, vomiting, odynophagia. She denies fever, cough, headaches, swelling in her lower extremities.   She endorses intermittent dysphagia with meats and breath. She had not had this evaluated and has not had endoscopy.  She denies dysuria, polyuria, diarrhea.   She reports that several years ago, she had dizziness and no syncpe. This is the first time she expereimced passing out.   Social history: She lives by herself. She is widowed for 5 months. Her husband passed from MI from covid complications. She denies tobacco, etoh, recreational drug use. She is retired and previously worked as an Software engineer for [employer].  Vaccination history: She is vaccinated for covid-19, [employer], 3 doses  ROS: Constitutional: no weight change, no fever ENT/Mouth: no sore throat, no rhinorrhea Eyes: no eye pain, no vision changes Cardiovascular: no chest pain, no dyspnea,  no edema, no  palpitations Respiratory: no cough, no sputum, no wheezing Gastrointestinal: no nausea, no vomiting, no diarrhea, no constipation Genitourinary: no urinary incontinence, no dysuria, no hematuria Musculoskeletal: no arthralgias, no myalgias Skin: no skin lesions, no pruritus, Neuro: + weakness, + loss of consciousness, + syncope Psych: no anxiety, no depression, + decrease appetite Heme/Lymph: no bruising, no bleeding  ED Course: Discussed with EDP, patient requiring hospitalization for syncope.  Vitals was remarkable for temperature 97.5, respiration rate of 16, heart rate 9069, blood pressure 126/78, SPO2 of 97% on room air.  No medical intervention was initiated by EDP  Labs was remarkable for serum creatinine of 0.85, BUN 30, nonfasting blood glucose 155, COVID was negative, EGFR greater than 60, D-dimer was negative at 0.35. Bilateral lower extremity ultrasound to assess for DVT was also negative.  Assessment/Plan  Principal Problem:   Syncope Active Problems:   Acid reflux   Plantar fasciitis   Dupuytren's contracture of foot   Hypertension   OSA on CPAP   PAH (pulmonary artery hypertension) ([place])   Syncope Query grief reaction with recent death of her husband - Check thyroid TSH, B12, troponin x2, D-dimer was negative -CT of the head was read as no acute intracranial abnormality - MRI of the brain ordered - Echo ordered - Would recommend follow-up with cardiology for 14-day cardiac event monitor to assess for cardiac arrhythmia  Hypertension-amlodipine 5 mg daily, hydrochlorothiazide 12.5 mg daily, ARB  Hyperlipidemia-rosuvastatin 10 mg nightly  Non-insulin-dependent diabetes mellitus-metformin 500 mg daily  Pulmonary hypertension-CPAP nightly ordered  Chart reviewed.   Echocardiogram on 07/06/2020 was read as 55%, mild tricuspid regurgitation, trivial aortic regurgitation  DVT  prophylaxis: Enoxaparin 40 mg subcutaneous every 24 hours Code Status: Full  code Diet: Heart healthy/carb modified Family Communication: Updated daughter at bedside Disposition Plan: Pending clinical course Consults called: None at this time Admission status: MedSurg, observation, telemetry  Past Medical History:  Diagnosis Date   Arthritis    Breast cancer ([place]) 1985   left breast cancer - chemotherapy   Cancer ([place])    Breast   GERD (gastroesophageal reflux disease)    Headache    migraines h/o   Hypertension    Personal history of chemotherapy     Past Surgical History:  Procedure Laterality Date   COLONOSCOPY WITH PROPOFOL N/A 04/01/2015   Procedure: COLONOSCOPY WITH PROPOFOL;  Surgeon: Hulen Luster, MD;  Location: [facility] ENDOSCOPY;  Service: Gastroenterology;  Laterality: N/A;   COLONOSCOPY WITH PROPOFOL N/A 11/09/2020   Procedure: COLONOSCOPY WITH PROPOFOL;  Surgeon: Lesly Rubenstein, MD;  Location: [facility] ENDOSCOPY;  Service: Endoscopy;  Laterality: N/A;   DILATION AND CURETTAGE OF UTERUS     MASTECTOMY Left    NECK SURGERY     neck deformity-straighten out neck as a child   PALATE / UVULA BIOPSY / EXCISION     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 She has never used smokeless tobacco. She reports that she does not drink alcohol and does not use drugs.  No Known Allergies Family History  Problem Relation Age of Onset   Diabetes Mother    Stroke Mother    Dementia Mother    Lung cancer Father    Heart disease Father    Stroke Brother    Diabetes Brother    Throat cancer Brother    Diabetes Brother    Breast cancer Neg Hx    Family history: Family history reviewed and not pertinent  Prior to Admission medications   Medication Sig Start Date End Date Taking? Authorizing Provider  ACCU-CHEK GUIDE test strip CHECK  BLOOD SUGAR EVERY DAY 05/22/20   Jerrol Banana., MD  Accu-Chek Softclix Lancets lancets CHECK BLOOD SUGARL ONCE DAILY 08/24/20   Jerrol Banana., MD  amLODipine (NORVASC) 5 MG tablet TAKE 1 TABLET BY MOUTH EVERY DAY IN THE EVENING 05/10/20   Jerrol Banana., MD  blood glucose meter kit and supplies Dispense based on patient and insurance preference. Check blood sugar once a day (FOR ICD-10 E10.9, E11.9). 03/01/20   Jerrol Banana., MD  Cholecalciferol (VITAMIN D PO) Take by mouth 2 (two) times daily. Pt unsure of dosage.    [provider]  COVID-19 mRNA vaccine, Pfizer, 30 MCG/0.3ML injection USE AS DIRECTED Patient not taking: Reported on 12/21/2020 06/01/20 06/01/21  Carlyle Basques, MD  hydrochlorothiazide (MICROZIDE) 12.5 MG capsule TAKE 1 CAPSULE BY MOUTH EVERY DAY 09/16/20   Jerrol Banana., MD  metFORMIN (GLUCOPHAGE) 500 MG tablet Take 1 tablet (500 mg total) by mouth daily with supper. 03/01/20   Jerrol Banana., MD  Multiple Vitamins-Minerals (PRESERVISION/LUTEIN) CAPS Take by mouth 2 (two) times daily. Reported on 09/15/2015    [provider]  naproxen (NAPROSYN) 375 MG tablet Take 1 tablet (375 mg total) by mouth 2 (two) times daily with a meal. 10/28/19   Jerrol Banana., MD  omeprazole (PRILOSEC) 20 MG capsule TAKE 1 CAPSULE BY MOUTH EVERY DAY IN THE MORNING 07/05/20   Jerrol Banana., MD  rosuvastatin (CRESTOR) 10 MG tablet Take 10 mg by mouth daily.    [provider]  telmisartan (MICARDIS) 80 MG tablet TAKE 1 TABLET BY MOUTH DAILY 10/12/20   Jerrol Banana., MD  vitamin E 400 UNIT capsule Take 400 Units by mouth daily. Reported on 09/15/2015    [provider]   Physical Exam: Vitals:   12/21/20 1900 12/21/20 1930 12/21/20 2200 12/21/20 2230  BP: (!) 107/59 (!) 104/54 (!) 106/58 (!) 100/54  Pulse: 65 75 65 67  Resp: _0 Temp:      TempSrc:      SpO2: 97% 95% 94% 94%    Constitutional: appears age-appropriate, NAD, calm, comfortable Eyes: PERRL, lids and conjunctivae normal ENMT: Mucous membranes are moist. Posterior pharynx clear of any exudate or lesions. Age-appropriate dentition. Hearing appropriate Neck: normal, supple, no masses, no thyromegaly Respiratory: clear to auscultation bilaterally, no wheezing, no crackles. Normal respiratory effort. No accessory muscle use.  Cardiovascular: Regular rate and rhythm, no murmurs / rubs / gallops. No extremity edema. 2+ pedal pulses. No carotid bruits.  Abdomen: no tenderness, no masses palpated, no hepatosplenomegaly. Bowel sounds positive.  Musculoskeletal: no clubbing / cyanosis. No joint deformity upper and lower extremities. Good ROM, no contractures, no atrophy. Normal muscle tone.  Skin: no rashes, lesions, ulcers. No induration Neurologic: Sensation intact. Strength 5/5 in all 4.  Psychiatric: Normal judgment and insight. Alert and oriented x 3. Normal mood.   EKG: independently reviewed, showing sinus rhythm with rate of 61, qtc 448   Chest x-ray on Admission: I personally independently reviewed chest x-ray and agree with radiology reading  X-ray chest PA and lateral  Result Date: 12/21/2020 CLINICAL DATA:  Syncopal episode yesterday, initial encounter EXAM: CHEST - 2 VIEW COMPARISON:  10/03/2018 FINDINGS: Cardiac shadow is stable. Postsurgical changes in the left axilla are noted. Lungs are well aerated bilaterally with mild bibasilar scarring. No focal infiltrate or effusion is seen. No bony abnormality is noted. IMPRESSION: Mild bibasilar scarring stable from the previous exam Electronically Signed   By: Inez Catalina M.D.   On: 12/21/2020 16:27   CT HEAD WO CONTRAST  Result Date: 12/21/2020 CLINICAL DATA:  Recurrent syncope. EXAM: CT HEAD WITHOUT CONTRAST TECHNIQUE: Contiguous axial images were obtained from the base of the skull through the vertex without intravenous contrast. COMPARISON:  None.  FINDINGS: Brain: No evidence of acute infarction, hemorrhage, hydrocephalus, extra-axial collection or mass lesion/mass effect. Vascular: Negative for hyperdense vessel Skull: Negative Sinuses/Orbits: Negative Other: None IMPRESSION: Negative CT head Electronically Signed   By: Franchot Gallo M.D.   On: 12/21/2020 15:31   US Venous Img Lower Bilateral  Result Date: 12/21/2020 CLINICAL DATA:  75 year old female with new swelling of the right lower extremity EXAM: BILATERAL LOWER EXTREMITY VENOUS DOPPLER ULTRASOUND TECHNIQUE: Gray-scale sonography with graded compression, as well as color Doppler and duplex ultrasound were performed to evaluate the lower extremity deep venous systems from the level of the common femoral vein and including the common femoral, femoral, profunda femoral, popliteal and calf veins including the posterior tibial, peroneal and gastrocnemius veins when visible. The superficial great saphenous vein was also interrogated. Spectral Doppler was utilized to evaluate flow at rest and with distal augmentation maneuvers in the common femoral, femoral and popliteal veins. COMPARISON:  None. FINDINGS: RIGHT LOWER EXTREMITY Common Femoral  Vein: No evidence of thrombus. Normal compressibility, respiratory phasicity and response to augmentation. Saphenofemoral Junction: No evidence of thrombus. Normal compressibility and flow on color Doppler imaging. Profunda Femoral Vein: No evidence of thrombus. Normal compressibility and flow on color Doppler imaging. Femoral Vein: No evidence of thrombus. Normal compressibility, respiratory phasicity and response to augmentation. Popliteal Vein: No evidence of thrombus. Normal compressibility, respiratory phasicity and response to augmentation. Calf Veins: No evidence of thrombus. Normal compressibility and flow on color Doppler imaging. Superficial Great Saphenous Vein: No evidence of thrombus. Normal compressibility and flow on color Doppler imaging. Other  Findings:  None. LEFT LOWER EXTREMITY Common Femoral Vein: No evidence of thrombus. Normal compressibility, respiratory phasicity and response to augmentation. Saphenofemoral Junction: No evidence of thrombus. Normal compressibility and flow on color Doppler imaging. Profunda Femoral Vein: No evidence of thrombus. Normal compressibility and flow on color Doppler imaging. Femoral Vein: No evidence of thrombus. Normal compressibility, respiratory phasicity and response to augmentation. Popliteal Vein: No evidence of thrombus. Normal compressibility, respiratory phasicity and response to augmentation. Calf Veins: No evidence of thrombus. Normal compressibility and flow on color Doppler imaging. Superficial Great Saphenous Vein: No evidence of thrombus. Normal compressibility and flow on color Doppler imaging. Other Findings:  None. IMPRESSION: Sonographic survey of the bilateral lower extremity negative for DVT Electronically Signed   By: Corrie Mckusick D.O.   On: 12/21/2020 14:34    Labs on Admission: I have personally reviewed following labs  CBC: Recent Labs  Lab 12/21/20 1037  WBC 8.8  HGB 13.9  HCT 40.7  MCV 92.9  PLT 518   Basic Metabolic Panel: Recent Labs  Lab 12/21/20 1037  NA 137  K 3.9  CL 104  CO2 24  GLUCOSE 155*  BUN 30*  CREATININE 0.85  CALCIUM 9.4   GFR: Estimated Creatinine Clearance: 53 mL/min (by C-G formula based on SCr of 0.85 mg/dL).  Urine analysis:    Component Value Date/Time   COLORURINE YELLOW (A) 12/21/2020 1037   APPEARANCEUR CLEAR (A) 12/21/2020 1037   LABSPEC 1.013 12/21/2020 1037   PHURINE 5.0 12/21/2020 1037   GLUCOSEU NEGATIVE 12/21/2020 1037   HGBUR NEGATIVE 12/21/2020 1037   BILIRUBINUR NEGATIVE 12/21/2020 1037   KETONESUR NEGATIVE 12/21/2020 1037   PROTEINUR NEGATIVE 12/21/2020 1037   NITRITE NEGATIVE 12/21/2020 1037   LEUKOCYTESUR NEGATIVE 12/21/2020 1037   Dr. Tobie Poet [facility]  If 7PM-7AM, please contact overnight-coverage  provider If 7AM-7PM, please contact day coverage provider [email or web address]  12/22/2020, 12:52 AM

## 2020-12-21 NOTE — ED Provider Notes (Signed)
[facility] Emergency Department Provider Note ____________________________________________   Event Date/Time   First MD Initiated Contact with Patient 12/21/20 1030     (approximate)  I have reviewed the triage vital signs and the nursing notes.  HISTORY  Chief Complaint Loss of Consciousness   HPI Erin Middleton is a 75 y.o. femalewho presents to the ED for evaluation of syncope.   Chart review indicates history of HTN, breast cancer s/p chemotherapy and mastectomy.  Follows with cardiology due to moderate tricuspid insufficiency and mild pulmonary hypertension.  Has an upcoming functional stress test.  Patient presents to the ED, accompanied by her daughter, for evaluation of a syncopal episode that occurred yesterday.  Patient reports performing laundry, walking down the hall with some clothes from the dryer, when she suddenly syncopized and fell forward onto the clothes she was carrying.  Denies head trauma or syncope.  Denies significant trauma .   She reports a sensation that she calls "heartburn" with chest burning sensation over the past 2 days.  She reports that she was feeling the sensation prior to her syncopal episode, as well as feeling about 2 hours of this pain this morning.  Denies heartburn or chest pain right now.  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  And wearing a knee brace.   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   Hypertension 01/18/2015   Breast CA (HCC) 11/04/2014   Acid reflux 11/04/2014   Blood pressure elevated 11/04/2014   Plantar fasciitis 11/04/2014   H/O ear disorder 05/19/2014   Breast cancer, female (HCC) 05/19/2014   Dupuytren's contracture of foot 05/19/2014   Avitaminosis D 09/21/2008   Arthropathia 10/08/2006   History of colon polyps 07/19/2006    Past Surgical History:  Procedure Laterality Date   COLONOSCOPY WITH PROPOFOL N/A 04/01/2015   Procedure: COLONOSCOPY WITH PROPOFOL;  Surgeon: Wallace Cullens, MD;  Location: [facility] ENDOSCOPY;  Service: Gastroenterology;  Laterality: N/A;   COLONOSCOPY WITH PROPOFOL N/A 11/09/2020   Procedure: COLONOSCOPY WITH PROPOFOL;  Surgeon: Regis Bill, MD;  Location: [facility] ENDOSCOPY;  Service: Endoscopy;  Laterality: N/A;   DILATION AND CURETTAGE OF UTERUS     MASTECTOMY Left    NECK SURGERY     neck deformity-straighten out neck as a child   PALATE / UVULA BIOPSY / EXCISION     TOTAL KNEE ARTHROPLASTY Left 09/22/2015   Procedure: TOTAL KNEE ARTHROPLASTY;  Surgeon: Deeann Saint, MD;  Location: [facility] ORS;  Service: Orthopedics;  Laterality: Left;   US CAROTID DOPPLER BILATERAL ([facility] HX)      Prior to Admission medications   Medication Sig Start Date End Date Taking? Authorizing Provider  ACCU-CHEK GUIDE test strip CHECK BLOOD SUGAR EVERY DAY 05/22/20   Maple Hudson., MD  Accu-Chek Softclix Lancets lancets CHECK BLOOD SUGARL ONCE DAILY 08/24/20   Julieanne Manson  Kaylyn Lim., MD  amLODipine (NORVASC) 5 MG tablet TAKE 1 TABLET BY MOUTH EVERY DAY IN THE EVENING 05/10/20   Jerrol Banana., MD  blood glucose meter kit and supplies Dispense based on patient and insurance preference. Check blood sugar once a day (FOR ICD-10 E10.9, E11.9). 03/01/20   Jerrol Banana., MD  Cholecalciferol (VITAMIN D PO) Take by mouth 2 (two) times daily. Pt unsure of dosage.    [provider]  COVID-19 mRNA vaccine,  Pfizer, 30 MCG/0.3ML injection USE AS DIRECTED Patient not taking: Reported on 12/21/2020 06/01/20 06/01/21  Carlyle Basques, MD  hydrochlorothiazide (MICROZIDE) 12.5 MG capsule TAKE 1 CAPSULE BY MOUTH EVERY DAY 09/16/20   Jerrol Banana., MD  metFORMIN (GLUCOPHAGE) 500 MG tablet Take 1 tablet (500 mg total) by mouth daily with supper. 03/01/20   Jerrol Banana., MD  Multiple Vitamins-Minerals (PRESERVISION/LUTEIN) CAPS Take by mouth 2 (two) times daily. Reported on 09/15/2015    [provider]  naproxen (NAPROSYN) 375 MG tablet Take 1 tablet (375 mg total) by mouth 2 (two) times daily with a meal. 10/28/19   Jerrol Banana., MD  omeprazole (PRILOSEC) 20 MG capsule TAKE 1 CAPSULE BY MOUTH EVERY DAY IN THE MORNING 07/05/20   Jerrol Banana., MD  rosuvastatin (CRESTOR) 10 MG tablet Take 10 mg by mouth daily.    [provider]  telmisartan (MICARDIS) 80 MG tablet TAKE 1 TABLET BY MOUTH DAILY 10/12/20   Jerrol Banana., MD  vitamin E 400 UNIT capsule Take 400 Units by mouth daily. Reported on 09/15/2015    [provider]    Allergies Patient has no known allergies.  Family History  Problem Relation Age of Onset   Diabetes Mother    Stroke Mother    Dementia Mother    Lung cancer Father    Heart disease Father    Stroke Brother    Diabetes Brother    Throat cancer Brother    Diabetes Brother    Breast cancer Neg Hx     Social History Social History   Tobacco Use   Smoking status: Never   Smokeless tobacco: Never  Vaping Use   Vaping Use: Never used  Substance Use Topics   Alcohol use: No    Alcohol/week: 0.0 standard drinks   Drug use: No    Review of Systems  Constitutional: No fever/chills Eyes: No visual changes. ENT: No sore throat. Cardiovascular: Denies chest pain. Respiratory: Denies shortness of breath. Gastrointestinal: No abdominal pain.  No nausea, no vomiting.  No diarrhea.  No  constipation. Genitourinary: Negative for dysuria. Musculoskeletal: Negative for back pain. Skin: Negative for rash. Neurological: Negative for headaches, focal weakness or numbness.  ____________________________________________   PHYSICAL EXAM:  VITAL SIGNS: Vitals:   12/21/20 1018 12/21/20 1237  BP: (!) 126/53 135/69  Pulse: 69 63  Resp: 16 18  Temp: 97.6 F (36.4 C)   SpO2: 99% 98%     Constitutional: Alert and oriented. Well appearing and in no acute distress. Eyes: Conjunctivae are normal. PERRL. EOMI. Head: Atraumatic. Nose: No congestion/rhinnorhea. Mouth/Throat: Mucous membranes are moist.  Oropharynx non-erythematous. Neck: No stridor. No cervical spine tenderness to palpation. Cardiovascular: Normal rate, regular rhythm. Grossly normal heart sounds.  Good peripheral circulation. Respiratory: Normal respiratory effort.  No retractions. Lungs CTAB. Gastrointestinal: Soft , nondistended, nontender to palpation. No CVA tenderness. Musculoskeletal:  No joint effusions. No signs of acute trauma.. Trace pitting edema  to bilateral lower extremities symmetrically.  Tenderness to Palpation on the right, lesser to the left.  No tense calf muscles or skin changes. Neurologic:  Normal speech and language. No gross focal neurologic deficits are appreciated.  Cranial nerves II through XII intact 5/5 strength and sensation in all 4 extremities Skin:  Skin is warm, dry and intact. No rash noted. Psychiatric: Mood and affect are normal. Speech and behavior are normal.  ____________________________________________   LABS (all labs ordered are listed, but only abnormal results are displayed)  Labs Reviewed  BASIC METABOLIC PANEL - Abnormal; Notable for the following components:      Result Value   Glucose, Bld 155 (*)    BUN 30 (*)    All other components within normal limits  RESP PANEL BY RT-PCR (FLU A&B, COVID) ARPGX2  CBC  D-DIMER, QUANTITATIVE  URINALYSIS, COMPLETE  (UACMP) WITH MICROSCOPIC  TROPONIN I (HIGH SENSITIVITY)  TROPONIN I (HIGH SENSITIVITY)   ____________________________________________  12 Lead EKG  Sinus rhythm, rate of 61 bpm.  Wandering baseline.  Evidence of left anterior fascicular block. normal axis. no evidence of acute ischemia. ____________________________________________  RADIOLOGY  ED MD interpretation:    Official radiology report(s): US Venous Img Lower Bilateral  Result Date: 12/21/2020 CLINICAL DATA:  75 year old female with new swelling of the right lower extremity EXAM: BILATERAL LOWER EXTREMITY VENOUS DOPPLER ULTRASOUND TECHNIQUE: Gray-scale sonography with graded compression, as well as color Doppler and duplex ultrasound were performed to evaluate the lower extremity deep venous systems from the level of the common femoral vein and including the common femoral, femoral, profunda femoral, popliteal and calf veins including the posterior tibial, peroneal and gastrocnemius veins when visible. The superficial great saphenous vein was also interrogated. Spectral Doppler was utilized to evaluate flow at rest and with distal augmentation maneuvers in the common femoral, femoral and popliteal veins. COMPARISON:  None. FINDINGS: RIGHT LOWER EXTREMITY Common Femoral Vein: No evidence of thrombus. Normal compressibility, respiratory phasicity and response to augmentation. Saphenofemoral Junction: No evidence of thrombus. Normal compressibility and flow on color Doppler imaging. Profunda Femoral Vein: No evidence of thrombus. Normal compressibility and flow on color Doppler imaging. Femoral Vein: No evidence of thrombus. Normal compressibility, respiratory phasicity and response to augmentation. Popliteal Vein: No evidence of thrombus. Normal compressibility, respiratory phasicity and response to augmentation. Calf Veins: No evidence of thrombus. Normal compressibility and flow on color Doppler imaging. Superficial Great Saphenous Vein: No  evidence of thrombus. Normal compressibility and flow on color Doppler imaging. Other Findings:  None. LEFT LOWER EXTREMITY Common Femoral Vein: No evidence of thrombus. Normal compressibility, respiratory phasicity and response to augmentation. Saphenofemoral Junction: No evidence of thrombus. Normal compressibility and flow on color Doppler imaging. Profunda Femoral Vein: No evidence of thrombus. Normal compressibility and flow on color Doppler imaging. Femoral Vein: No evidence of thrombus. Normal compressibility, respiratory phasicity and response to augmentation. Popliteal Vein: No evidence of thrombus. Normal compressibility, respiratory phasicity and response to augmentation. Calf Veins: No evidence of thrombus. Normal compressibility and flow on color Doppler imaging. Superficial Great Saphenous Vein: No evidence of thrombus. Normal compressibility and flow on color Doppler imaging. Other Findings:  None. IMPRESSION: Sonographic survey of the bilateral lower extremity negative for DVT Electronically Signed   By: Corrie Mckusick D.O.   On: 12/21/2020 14:34    ____________________________________________   PROCEDURES and INTERVENTIONS  Procedure(s) performed (including Critical Care):  .1-3 Lead EKG Interpretation  Date/Time: 12/21/2020 2:46 PM Performed by: Vladimir Crofts, MD Authorized by:  Vladimir Crofts, MD     Interpretation: normal     ECG rate:  64   ECG rate assessment: normal     Rhythm: sinus rhythm     Ectopy: none     Conduction: normal    Medications - No data to display  ____________________________________________   MDM / ED COURSE   75 year old woman presents to the ED after sudden syncope requiring medical observation admission.  Normal vitals on room air.  Exam with lower extremity swelling and calf tenderness, primarily on the right, but no skin changes.  Work-up is largely benign.  No evidence of ACS, D-dimer is negative effectively ruling out acute PE, and she has no  evidence of DVT on bilateral lower extremity ultrasounds.  Considering her lack of preceding symptoms and just sudden collapse without prodrome, I will discuss the case with hospitalist for medical admission  Clinical Course as of 12/21/20 1445  Tue Dec 21, 2020  1443 Reassessed.  Patient reports feeling well.  We discussed relatively benign work-up so far without evidence of PE or DVT.  We discussed her syncopal episode my recommendation for medical observation admission, she is agreeable. [DS]    Clinical Course User Index [DS] Vladimir Crofts, MD    ____________________________________________   FINAL CLINICAL IMPRESSION(S) / ED DIAGNOSES  Final diagnoses:  Syncope and collapse     ED Discharge Orders     None        Edyth Glomb Tamala Julian   Note:  This document was prepared using Dragon voice recognition software and may include unintentional dictation errors.    Vladimir Crofts, MD 12/21/20 [phone]

## 2020-12-21 NOTE — Patient Instructions (Signed)
Continue CPAP as prescribed  Patient advised to go to the ER to assess syncopal episode

## 2020-12-21 NOTE — ED Notes (Signed)
First Nurse Note: Pt sent from cardiologist for cardiac workup for syncopal episode yesterday. Pt is in NAD.

## 2020-12-21 NOTE — ED Triage Notes (Signed)
Pt states she was walking in the house yesterday and had a syncopal episode, denies injury from the fall, states she has just been feel tired and fatigued. Pt is a/o x4

## 2020-12-22 ENCOUNTER — Observation Stay: Payer: Medicare PPO

## 2020-12-22 DIAGNOSIS — R55 Syncope and collapse: Secondary | ICD-10-CM | POA: Diagnosis not present

## 2020-12-22 DIAGNOSIS — I10 Essential (primary) hypertension: Secondary | ICD-10-CM | POA: Diagnosis not present

## 2020-12-22 DIAGNOSIS — E119 Type 2 diabetes mellitus without complications: Secondary | ICD-10-CM

## 2020-12-22 DIAGNOSIS — Q231 Congenital insufficiency of aortic valve: Secondary | ICD-10-CM

## 2020-12-22 DIAGNOSIS — G319 Degenerative disease of nervous system, unspecified: Secondary | ICD-10-CM | POA: Diagnosis not present

## 2020-12-22 DIAGNOSIS — Q2381 Bicuspid aortic valve: Secondary | ICD-10-CM

## 2020-12-22 LAB — ECHOCARDIOGRAM COMPLETE
Area-P 1/2: 1.91 cm2
Height: 63 in
P 1/2 time: 428 msec
S' Lateral: 2.2 cm
Weight: 2572.8 oz

## 2020-12-22 LAB — HEMOGLOBIN A1C
Hgb A1c MFr Bld: 7 % — ABNORMAL HIGH (ref 4.8–5.6)
Mean Plasma Glucose: 154.2 mg/dL

## 2020-12-22 LAB — CBG MONITORING, ED
Glucose-Capillary: 143 mg/dL — ABNORMAL HIGH (ref 70–99)
Glucose-Capillary: 149 mg/dL — ABNORMAL HIGH (ref 70–99)
Glucose-Capillary: 129 mg/dL — ABNORMAL HIGH (ref 70–99)

## 2020-12-22 MED ORDER — INSULIN ASPART 100 UNIT/ML IJ SOLN: 0.0000 [IU] | Freq: Every day | INTRAMUSCULAR | Status: DC

## 2020-12-22 MED ORDER — INSULIN ASPART 100 UNIT/ML IJ SOLN: 0.0000 [IU] | Freq: Three times a day (TID) | INTRAMUSCULAR | Status: DC

## 2020-12-22 MED ORDER — LORAZEPAM 2 MG/ML IJ SOLN
0.5000 mg | Freq: Once | INTRAMUSCULAR | Status: AC
  Administered 2020-12-22: 0.5 mg via INTRAVENOUS
  Filled 2020-12-22: qty 1

## 2020-12-22 NOTE — Consult Note (Signed)
[facility] Cardiology Consultation Note  Patient ID: Erin Middleton, MRN: [id number], DOB/AGE: 11/06/1945 75 y.o. Admit date: 12/21/2020   Date of Consult: 12/22/2020 Primary Physician: Jerrol Banana., MD Primary Cardiologist: Ubaldo Glassing  Chief Complaint:  Chief Complaint  Patient presents with   Loss of Consciousness   Reason for Consult:  Syncope  HPI: 75 y.o. female with known diabetes sleep apnea pulmonary hypertension hyperlipidemia and systemic hypertension who has had appropriate medication management.  She is slowly improving with her medication management and treatment options recently though the she does have significant progression of shortness of breath with physical activity relieved by rest.  This is progressive in nature and can sometimes causing some dizziness and chest pressure.  She did have an episode where she was Carrying laundry and had dizziness and a syncopal episode.  She does not hurt her self and had no evidence of rhythm disturbances or other concerns with current work-up in the emergency room.  The patient has had an EKG showing normal sinus rhythm left anterior fascicular block.  Additionally recent echocardiogram has shown normal LV systolic function with ejection fraction of 65% and a bicuspid aortic valve with slight dilation of the aortic root.  There is mild aortic insufficiency and mitral regurgitation and mild tricuspid regurgitation with possible mild pulmonary hypertension.  The patient has felt well throughout her hospital course and has not had any rhythm disturbances concerning for further issue.  Currently she is hemodynamically stable  Past Medical History:  Diagnosis Date   Arthritis    Breast cancer ([place]) 1985   left breast cancer - chemotherapy   Cancer ([place])    Breast   GERD (gastroesophageal reflux disease)    Headache    migraines h/o   Hypertension    Personal history of chemotherapy       Surgical History:  Past Surgical  History:  Procedure Laterality Date   COLONOSCOPY WITH PROPOFOL N/A 04/01/2015   Procedure: COLONOSCOPY WITH PROPOFOL;  Surgeon: Hulen Luster, MD;  Location: [facility] ENDOSCOPY;  Service: Gastroenterology;  Laterality: N/A;   COLONOSCOPY WITH PROPOFOL N/A 11/09/2020   Procedure: COLONOSCOPY WITH PROPOFOL;  Surgeon: Lesly Rubenstein, MD;  Location: [facility] ENDOSCOPY;  Service: Endoscopy;  Laterality: N/A;   DILATION AND CURETTAGE OF UTERUS     MASTECTOMY Left    NECK SURGERY     neck deformity-straighten out neck as a child   PALATE / UVULA BIOPSY / EXCISION     TOTAL KNEE ARTHROPLASTY Left 09/22/2015   Procedure: TOTAL KNEE ARTHROPLASTY;  Surgeon: Earnestine Leys, MD;  Location: [facility] ORS;  Service: Orthopedics;  Laterality: Left;   US CAROTID DOPPLER BILATERAL ([facility] HX)       Home Meds: Prior to Admission medications   Medication Sig Start Date End Date Taking? Authorizing Provider  ACCU-CHEK GUIDE test strip CHECK BLOOD SUGAR EVERY DAY 05/22/20  Yes Jerrol Banana., MD  Accu-Chek Softclix Lancets lancets CHECK BLOOD SUGARL ONCE DAILY 08/24/20  Yes Jerrol Banana., MD  amLODipine (NORVASC) 5 MG tablet TAKE 1 TABLET BY MOUTH EVERY DAY IN THE EVENING Patient taking differently: Take 5 mg by mouth every evening. 05/10/20  Yes Jerrol Banana., MD  blood glucose meter kit and supplies Dispense based on patient and insurance preference. Check blood sugar once a day (FOR ICD-10 E10.9, E11.9). 03/01/20  Yes Jerrol Banana., MD  cholecalciferol (VITAMIN D3) 25 MCG (1000 UNIT) tablet Take 1,000 Units by  mouth daily.   Yes [provider]  hydrochlorothiazide (MICROZIDE) 12.5 MG capsule TAKE 1 CAPSULE BY MOUTH EVERY DAY Patient taking differently: Take 12.5 mg by mouth daily. 09/16/20  Yes Jerrol Banana., MD  metFORMIN (GLUCOPHAGE) 500 MG tablet Take 1 tablet (500 mg total) by mouth daily with supper. 03/01/20  Yes Jerrol Banana., MD  Multiple Vitamins-Minerals  (PRESERVISION/LUTEIN) CAPS Take 1 capsule by mouth at bedtime.   Yes [provider]  omeprazole (PRILOSEC) 20 MG capsule TAKE 1 CAPSULE BY MOUTH EVERY DAY IN THE MORNING Patient taking differently: Take 20 mg by mouth daily. 07/05/20  Yes Jerrol Banana., MD  rosuvastatin (CRESTOR) 10 MG tablet Take 10 mg by mouth daily.   Yes [provider]  telmisartan (MICARDIS) 80 MG tablet TAKE 1 TABLET BY MOUTH DAILY Patient taking differently: Take 80 mg by mouth daily. 10/12/20  Yes Jerrol Banana., MD  vitamin E 400 UNIT capsule Take 400 Units by mouth daily. Reported on 09/15/2015   Yes [provider]    Inpatient Medications:   enoxaparin (LOVENOX) injection  40 mg Subcutaneous Q24H   metFORMIN  500 mg Oral Q supper   pantoprazole  40 mg Oral Daily   rosuvastatin  10 mg Oral QHS   sodium chloride flush  3 mL Intravenous Q12H     Allergies: No Known Allergies  Social History   Socioeconomic History   Marital status: Widowed    Spouse name: Not on file   Number of children: 2   Years of education: Not on file   Highest education level: Some college, no degree  Occupational History   Occupation: retired  Tobacco Use   Smoking status: Never   Smokeless tobacco: Never  Vaping Use   Vaping Use: Never used  Substance and Sexual Activity   Alcohol use: No    Alcohol/week: 0.0 standard drinks   Drug use: No   Sexual activity: Never  Other Topics Concern   Not on file  Social History Narrative   Not on file   Social Determinants of Health   Financial Resource Strain: Not on file  Food Insecurity: Not on file  Transportation Needs: Not on file  Physical Activity: Not on file  Stress: Not on file  Social Connections: Not on file  Intimate Partner Violence: Not on file     Family History  Problem Relation Age of Onset   Diabetes Mother    Stroke Mother    Dementia Mother    Lung cancer Father    Heart disease Father    Stroke Brother     Diabetes Brother    Throat cancer Brother    Diabetes Brother    Breast cancer Neg Hx      Review of Systems Positive for syncope Negative for: General:  chills, fever, night sweats or weight changes.  Cardiovascular: PND orthopnea positive for syncope dizziness  Dermatological skin lesions rashes Respiratory: Cough congestion Urologic: Frequent urination urination at night and hematuria Abdominal: negative for nausea, vomiting, diarrhea, bright red blood per rectum, melena, or hematemesis Neurologic: negative for visual changes, and/or hearing changes  All other systems reviewed and are otherwise negative except as noted above.  Labs: No results for input(s): CKTOTAL, CKMB, TROPONINI in the last 72 hours. Lab Results  Component Value Date   WBC 8.8 12/21/2020   HGB 13.9 12/21/2020   HCT 40.7 12/21/2020   MCV 92.9 12/21/2020   PLT 211  12/21/2020    Recent Labs  Lab 12/21/20 1037  NA 137  K 3.9  CL 104  CO2 24  BUN 30*  CREATININE 0.85  CALCIUM 9.4  GLUCOSE 155*   Lab Results  Component Value Date   CHOL 120 08/03/2020   HDL 57 08/03/2020   LDLCALC 47 08/03/2020   TRIG 81 08/03/2020   Lab Results  Component Value Date   DDIMER 0.35 12/21/2020    Radiology/Studies:  X-ray chest PA and lateral  Result Date: 12/21/2020 CLINICAL DATA:  Syncopal episode yesterday, initial encounter EXAM: CHEST - 2 VIEW COMPARISON:  10/03/2018 FINDINGS: Cardiac shadow is stable. Postsurgical changes in the left axilla are noted. Lungs are well aerated bilaterally with mild bibasilar scarring. No focal infiltrate or effusion is seen. No bony abnormality is noted. IMPRESSION: Mild bibasilar scarring stable from the previous exam Electronically Signed   By: Inez Catalina M.D.   On: 12/21/2020 16:27   CT HEAD WO CONTRAST  Result Date: 12/21/2020 CLINICAL DATA:  Recurrent syncope. EXAM: CT HEAD WITHOUT CONTRAST TECHNIQUE: Contiguous axial images were obtained from the base of the  skull through the vertex without intravenous contrast. COMPARISON:  None. FINDINGS: Brain: No evidence of acute infarction, hemorrhage, hydrocephalus, extra-axial collection or mass lesion/mass effect. Vascular: Negative for hyperdense vessel Skull: Negative Sinuses/Orbits: Negative Other: None IMPRESSION: Negative CT head Electronically Signed   By: Franchot Gallo M.D.   On: 12/21/2020 15:31   MR BRAIN WO CONTRAST  Result Date: 12/22/2020 CLINICAL DATA:  Initial evaluation for acute syncope. EXAM: MRI HEAD WITHOUT CONTRAST TECHNIQUE: Multiplanar, multiecho pulse sequences of the brain and surrounding structures were obtained without intravenous contrast. COMPARISON:  Prior CT from 12/21/2020 FINDINGS: Brain: Generalized age-related cerebral atrophy. Scattered patchy T2/FLAIR hyperintensity seen within the periventricular, deep, and subcortical white matter both cerebral hemispheres, nonspecific, but most commonly related to chronic microvascular ischemic disease, mild for age. No abnormal foci of restricted diffusion to suggest acute or subacute ischemia. Gray-white matter differentiation maintained. No encephalomalacia to suggest chronic cortical infarction. No evidence for acute or chronic intracranial hemorrhage. No mass lesion, midline shift or mass effect. No hydrocephalus or extra-axial fluid collection. Pituitary gland suprasellar region within normal limits. Midline structures intact. Vascular: Major intracranial vascular flow voids are maintained. Skull and upper cervical spine: Craniocervical junction within normal limits. Grade 1 anterolisthesis of C2 on C3 noted. Visualized upper cervical spine otherwise unremarkable. Bone marrow signal intensity within normal limits. Hyperostosis frontalis interna noted. Scalp soft tissues unremarkable. Sinuses/Orbits: Patient status post bilateral ocular lens replacement. Globes and orbital soft tissues demonstrate no acute finding. Paranasal sinuses are clear.  Moderate left mastoid effusion noted. Inner ear structures grossly normal. Visualized nasopharynx unremarkable. Other: None. IMPRESSION: 1. No acute intracranial abnormality. 2. Mild age-related cerebral atrophy with chronic small vessel ischemic disease. 3. Moderate left mastoid effusion, of uncertain significance. Correlation with physical exam recommended. Electronically Signed   By: Jeannine Boga M.D.   On: 12/22/2020 04:08   US Venous Img Lower Bilateral  Result Date: 12/21/2020 CLINICAL DATA:  75 year old female with new swelling of the right lower extremity EXAM: BILATERAL LOWER EXTREMITY VENOUS DOPPLER ULTRASOUND TECHNIQUE: Gray-scale sonography with graded compression, as well as color Doppler and duplex ultrasound were performed to evaluate the lower extremity deep venous systems from the level of the common femoral vein and including the common femoral, femoral, profunda femoral, popliteal and calf veins including the posterior tibial, peroneal and gastrocnemius veins when visible. The superficial great saphenous  vein was also interrogated. Spectral Doppler was utilized to evaluate flow at rest and with distal augmentation maneuvers in the common femoral, femoral and popliteal veins. COMPARISON:  None. FINDINGS: RIGHT LOWER EXTREMITY Common Femoral Vein: No evidence of thrombus. Normal compressibility, respiratory phasicity and response to augmentation. Saphenofemoral Junction: No evidence of thrombus. Normal compressibility and flow on color Doppler imaging. Profunda Femoral Vein: No evidence of thrombus. Normal compressibility and flow on color Doppler imaging. Femoral Vein: No evidence of thrombus. Normal compressibility, respiratory phasicity and response to augmentation. Popliteal Vein: No evidence of thrombus. Normal compressibility, respiratory phasicity and response to augmentation. Calf Veins: No evidence of thrombus. Normal compressibility and flow on color Doppler imaging.  Superficial Great Saphenous Vein: No evidence of thrombus. Normal compressibility and flow on color Doppler imaging. Other Findings:  None. LEFT LOWER EXTREMITY Common Femoral Vein: No evidence of thrombus. Normal compressibility, respiratory phasicity and response to augmentation. Saphenofemoral Junction: No evidence of thrombus. Normal compressibility and flow on color Doppler imaging. Profunda Femoral Vein: No evidence of thrombus. Normal compressibility and flow on color Doppler imaging. Femoral Vein: No evidence of thrombus. Normal compressibility, respiratory phasicity and response to augmentation. Popliteal Vein: No evidence of thrombus. Normal compressibility, respiratory phasicity and response to augmentation. Calf Veins: No evidence of thrombus. Normal compressibility and flow on color Doppler imaging. Superficial Great Saphenous Vein: No evidence of thrombus. Normal compressibility and flow on color Doppler imaging. Other Findings:  None. IMPRESSION: Sonographic survey of the bilateral lower extremity negative for DVT Electronically Signed   By: Corrie Mckusick D.O.   On: 12/21/2020 14:34   ECHOCARDIOGRAM COMPLETE  Result Date: 12/22/2020    ECHOCARDIOGRAM REPORT   Patient Name:   Erin Middleton [facility] Date of Exam: 12/21/2020 Medical Rec #:  [id number]       Height:       63.0 in Accession #:    [id number]      Weight:       160.8 lb Date of Birth:  April 30, 1946       BSA:          1.762 m Patient Age:    52 years        BP:           115/55 mmHg Patient Gender: F               HR:           62 bpm. Exam Location:  [facility] Procedure: 2D Echo, Cardiac Doppler and Color Doppler Indications:     R55 Syncope  History:         Patient has no prior history of Echocardiogram examinations.                  Risk Factors:Hypertension. History of breast cancer.  Sonographer:     Wilford Sports Rodgers-Jones Referring Phys:  [id number] AMY N COX Diagnosing Phys: Serafina Royals MD IMPRESSIONS  1. Left ventricular ejection fraction, by  estimation, is 60 to 65%. The left ventricle has normal function. The left ventricle has no regional wall motion abnormalities. Left ventricular diastolic parameters were normal.  2. Right ventricular systolic function is normal. The right ventricular size is normal.  3. The mitral valve is normal in structure. Mild mitral valve regurgitation.  4. Tricuspid valve regurgitation is moderate.  5. The aortic valve is bicuspid. Aortic valve regurgitation is mild. Mild to moderate aortic valve sclerosis/calcification is present, without any evidence of aortic stenosis.  6. Aortic dilatation noted.  There is mild dilatation of the aortic root. FINDINGS  Left Ventricle: Left ventricular ejection fraction, by estimation, is 60 to 65%. The left ventricle has normal function. The left ventricle has no regional wall motion abnormalities. The left ventricular internal cavity size was normal in size. There is  no left ventricular hypertrophy. Left ventricular diastolic parameters were normal. Right Ventricle: The right ventricular size is normal. No increase in right ventricular wall thickness. Right ventricular systolic function is normal. Left Atrium: Left atrial size was normal in size. Right Atrium: Right atrial size was normal in size. Pericardium: There is no evidence of pericardial effusion. Mitral Valve: The mitral valve is normal in structure. Mild mitral valve regurgitation. Tricuspid Valve: The tricuspid valve is normal in structure. Tricuspid valve regurgitation is moderate. Aortic Valve: The aortic valve is bicuspid. Aortic valve regurgitation is mild. Aortic regurgitation PHT measures 428 msec. Mild to moderate aortic valve sclerosis/calcification is present, without any evidence of aortic stenosis. Pulmonic Valve: The pulmonic valve was normal in structure. Pulmonic valve regurgitation is not visualized. Aorta: Aortic dilatation noted. There is mild dilatation of the aortic root. IAS/Shunts: No atrial level shunt  detected by color flow Doppler.  LEFT VENTRICLE PLAX 2D LVIDd:         3.30 cm  Diastology LVIDs:         2.20 cm  LV e' medial:    4.90 cm/s LV PW:         1.00 cm  LV E/e' medial:  24.9 LV IVS:        1.00 cm  LV e' lateral:   7.40 cm/s LVOT diam:     1.70 cm  LV E/e' lateral: 16.5 LV SV:         64 LV SV Index:   36 LVOT Area:     2.27 cm  RIGHT VENTRICLE RV Basal diam:  4.10 cm RV S prime:     16.50 cm/s TAPSE (M-mode): 2.1 cm LEFT ATRIUM             Index       RIGHT ATRIUM           Index LA diam:        4.20 cm 2.38 cm/m  RA Area:     13.10 cm LA Vol (A2C):   33.4 ml 18.95 ml/m RA Volume:   34.70 ml  19.69 ml/m LA Vol (A4C):   37.4 ml 21.22 ml/m LA Biplane Vol: 36.1 ml 20.48 ml/m  AORTIC VALVE LVOT Vmax:   129.00 cm/s LVOT Vmean:  89.900 cm/s LVOT VTI:    0.281 m AI PHT:      428 msec  AORTA Ao Root diam: 3.10 cm Ao Asc diam:  3.30 cm MITRAL VALVE                TRICUSPID VALVE MV Area (PHT): 1.91 cm     TR Peak grad:   30.0 mmHg MV Decel Time: 398 msec     TR Vmax:        274.00 cm/s MV E velocity: 122.00 cm/s MV A velocity: 160.00 cm/s  SHUNTS MV E/A ratio:  0.76         Systemic VTI:  0.28 m                             Systemic Diam: 1.70 cm Serafina Royals MD Electronically signed by Serafina Royals MD Signature Date/Time:  12/22/2020/8:54:58 AM    Final     EKG: Normal sinus rhythm with left axis deviation and right bundle branch block  Weights: There were no vitals filed for this visit.   Physical Exam: Blood pressure 121/61, pulse 75, temperature 97.6 F (36.4 C), temperature source Oral, resp. rate 11, SpO2 99 %. There is no height or weight on file to calculate BMI. General: Well developed, well nourished, in no acute distress. Head eyes ears nose throat: Normocephalic, atraumatic, sclera non-icteric, no xanthomas, nares are without discharge. No apparent thyromegaly and/or mass  Lungs: Normal respiratory effort.  no wheezes, no rales, no rhonchi.  Heart: RRR with normal S1 S2.  3+  aortic murmur gallop, no rub, PMI is normal size and placement, carotid upstroke normal without bruit, jugular venous pressure is normal Abdomen: Soft, non-tender, non-distended with normoactive bowel sounds. No hepatomegaly. No rebound/guarding. No obvious abdominal masses. Abdominal aorta is normal size without bruit Extremities: No edema. no cyanosis, no clubbing, no ulcers  Peripheral : 2+ bilateral upper extremity pulses, 2+ bilateral femoral pulses, 2+ bilateral dorsal pedal pulse Neuro: Alert and oriented. No facial asymmetry. No focal deficit. Moves all extremities spontaneously. Musculoskeletal: Normal muscle tone without kyphosis Psych:  Responds to questions appropriately with a normal affect.    Assessment: 75 year old female with hypertension hyperlipidemia pulmonary hypertension sleep apnea diabetes and progression shortness of breath possibly consistent with stable angina and/or cardiovascular issues with bicuspid aortic valve and syncope without evidence of primary cause of syncope and no current evidence of acute coronary syndrome myocardial infarction or congestive heart failure  Plan: 1.  Begin ambulation and follow-up for improvements of symptoms and okay for discharge home from cardiac standpoint if no further significant symptoms with follow-up stress test and monitor as an outpatient 2.  Continue current medical regimen for risk factor management of cardiovascular disease without change 3.  Further treatment options after above  Signed, Corey Skains M.D. [facility] 12/22/2020, 9:06 AM

## 2020-12-22 NOTE — Progress Notes (Signed)
PROGRESS NOTE    Erin Middleton  GYI:[id number] DOB: 1946/02/04 DOA: 12/21/2020 PCP: Jerrol Banana., MD  Outpatient Specialists: [facility] cardiology    Brief Narrative:   Hx phtn, htn, tricuspid regurg, osa, presents with one episode syncope one day prior to arrival.   Assessment & Plan:   Principal Problem:   Syncope Active Problems:   Acid reflux   Plantar fasciitis   Dupuytren's contracture of foot   Hypertension   OSA on CPAP   PAH (pulmonary artery hypertension) ([place])  # Syncope Etiology unclear. Possibly orthostatic med side effect as bp here low normal and is on 3 antihypertensives at home. Hx osa, phtn, tricuspid regurg. Ekg shows first degree av block is at risk for conduction disturbance. Here tte has been performed, read is pending. No events on tele. Troponins negative. Dimer and dvt eval wnl. Mri w/o acute events. Electrolytes wnl, no anemia. - cardiology consulted - f/u tte results - maintain on tele - hold home hctz, amlodipine, and losartan - has outpt stress test scheduled next week - have spoken w/ nurse about obtaining orthostatic vital signs  # HTN Here bp low normal - holding home bp meds as above - cont home rosuvastatin  # T2DM - cont home metformin - daily fasting glucose  # OSA - cpap qhs  # pulmonary hypertension # Tricuspid regurg No signs fluid overload - TTE read pending  DVT prophylaxis: lovenox Code Status: full Family Communication: none @ bedside  Level of care: Med-Surg Status is: Observation  The patient remains OBS appropriate and will d/c before 2 midnights.  Dispo: The patient is from: Home              Anticipated d/c is to: Home              Patient currently is not medically stable to d/c.   Difficult to place patient No        Consultants:  cardiology  Procedures: none  Antimicrobials:  none    Subjective: This morning feels well, no lightheadedness, no syncope. No palpitations. No  n/v/d.   Objective: Vitals:   12/21/20 2230 12/22/20 0308 12/22/20 0330 12/22/20 0400  BP: (!) 100/54 (!) 105/52 (!) 99/48 (!) 107/58  Pulse: 67  69 71  Resp: 16 19 16 17   Temp:      TempSrc:      SpO2: 94% 95% 96% 94%   No intake or output data in the 24 hours ending 12/22/20 0742 There were no vitals filed for this visit.  Examination:  General exam: Appears calm and comfortable  Respiratory system: Clear to auscultation. Respiratory effort normal. Cardiovascular system: S1 & S2 heard, RRR. Mod systolic murmur Gastrointestinal system: Abdomen is nondistended, soft and nontender. No organomegaly or masses felt. Normal bowel sounds heard. Central nervous system: Alert and oriented. No focal neurological deficits. Extremities: Symmetric 5 x 5 power. Skin: No rashes, lesions or ulcers Psychiatry: Judgement and insight appear normal. Mood & affect appropriate.     Data Reviewed: I have personally reviewed following labs and imaging studies  CBC: Recent Labs  Lab 12/21/20 1037  WBC 8.8  HGB 13.9  HCT 40.7  MCV 92.9  PLT 350   Basic Metabolic Panel: Recent Labs  Lab 12/21/20 1037  NA 137  K 3.9  CL 104  CO2 24  GLUCOSE 155*  BUN 30*  CREATININE 0.85  CALCIUM 9.4   GFR: Estimated Creatinine Clearance: 53 mL/min (by C-G formula based  on SCr of 0.85 mg/dL). Liver Function Tests: No results for input(s): AST, ALT, ALKPHOS, BILITOT, PROT, ALBUMIN in the last 168 hours. No results for input(s): LIPASE, AMYLASE in the last 168 hours. No results for input(s): AMMONIA in the last 168 hours. Coagulation Profile: No results for input(s): INR, PROTIME in the last 168 hours. Cardiac Enzymes: No results for input(s): CKTOTAL, CKMB, CKMBINDEX, TROPONINI in the last 168 hours. BNP (last 3 results) No results for input(s): PROBNP in the last 8760 hours. HbA1C: No results for input(s): HGBA1C in the last 72 hours. CBG: Recent Labs  Lab 12/22/20 0105  GLUCAP 143*    Lipid Profile: No results for input(s): CHOL, HDL, LDLCALC, TRIG, CHOLHDL, LDLDIRECT in the last 72 hours. Thyroid Function Tests: Recent Labs    12/21/20 1743  TSH 4.267   Anemia Panel: Recent Labs    12/21/20 1743  VITAMINB12 367   Urine analysis:    Component Value Date/Time   COLORURINE YELLOW (A) 12/21/2020 1037   APPEARANCEUR CLEAR (A) 12/21/2020 1037   LABSPEC 1.013 12/21/2020 1037   PHURINE 5.0 12/21/2020 1037   GLUCOSEU NEGATIVE 12/21/2020 1037   HGBUR NEGATIVE 12/21/2020 1037   BILIRUBINUR NEGATIVE 12/21/2020 1037   KETONESUR NEGATIVE 12/21/2020 1037   PROTEINUR NEGATIVE 12/21/2020 1037   NITRITE NEGATIVE 12/21/2020 1037   LEUKOCYTESUR NEGATIVE 12/21/2020 1037   Sepsis Labs: @LABRCNTIP (procalcitonin:4,lacticidven:4)  ) Recent Results (from the past 240 hour(s))  Resp Panel by RT-PCR (Flu A&B, Covid) Nasopharyngeal Swab     Status: None   Collection Time: 12/21/20 12:45 PM   Specimen: Nasopharyngeal Swab; Nasopharyngeal(NP) swabs in vial transport medium  Result Value Ref Range Status   SARS Coronavirus 2 by RT PCR NEGATIVE NEGATIVE Final    Comment: (NOTE) SARS-CoV-2 target nucleic acids are NOT DETECTED.  The SARS-CoV-2 RNA is generally detectable in upper respiratory specimens during the acute phase of infection. The lowest concentration of SARS-CoV-2 viral copies this assay can detect is 138 copies/mL. A negative result does not preclude SARS-Cov-2 infection and should not be used as the sole basis for treatment or other patient management decisions. A negative result may occur with  improper specimen collection/handling, submission of specimen other than nasopharyngeal swab, presence of viral mutation(s) within the areas targeted by this assay, and inadequate number of viral copies(<138 copies/mL). A negative result must be combined with clinical observations, patient history, and epidemiological information. The expected result is  Negative.  Fact Sheet for Patients:  EntrepreneurPulse.com.au  Fact Sheet for Healthcare Providers:  IncredibleEmployment.be  This test is no t yet approved or cleared by the [place] FDA and  has been authorized for detection and/or diagnosis of SARS-CoV-2 by FDA under an Emergency Use Authorization (EUA). This EUA will remain  in effect (meaning this test can be used) for the duration of the COVID-19 declaration under Section 564(b)(1) of the Act, 21 U.S.C.section 360bbb-3(b)(1), unless the authorization is terminated  or revoked sooner.       Influenza A by PCR NEGATIVE NEGATIVE Final   Influenza B by PCR NEGATIVE NEGATIVE Final    Comment: (NOTE) The Xpert Xpress SARS-CoV-2/FLU/RSV plus assay is intended as an aid in the diagnosis of influenza from Nasopharyngeal swab specimens and should not be used as a sole basis for treatment. Nasal washings and aspirates are unacceptable for Xpert Xpress SARS-CoV-2/FLU/RSV testing.  Fact Sheet for Patients: EntrepreneurPulse.com.au  Fact Sheet for Healthcare Providers: IncredibleEmployment.be  This test is not yet approved or cleared by the [place]  States FDA and has been authorized for detection and/or diagnosis of SARS-CoV-2 by FDA under an Emergency Use Authorization (EUA). This EUA will remain in effect (meaning this test can be used) for the duration of the COVID-19 declaration under Section 564(b)(1) of the Act, 21 U.S.C. section 360bbb-3(b)(1), unless the authorization is terminated or revoked.  Performed at [facility], [address], [place], [place] [address]          Radiology Studies: X-ray chest PA and lateral  Result Date: 12/21/2020 CLINICAL DATA:  Syncopal episode yesterday, initial encounter EXAM: CHEST - 2 VIEW COMPARISON:  10/03/2018 FINDINGS: Cardiac shadow is stable. Postsurgical changes in the left axilla are noted.  Lungs are well aerated bilaterally with mild bibasilar scarring. No focal infiltrate or effusion is seen. No bony abnormality is noted. IMPRESSION: Mild bibasilar scarring stable from the previous exam Electronically Signed   By: Inez Catalina M.D.   On: 12/21/2020 16:27   CT HEAD WO CONTRAST  Result Date: 12/21/2020 CLINICAL DATA:  Recurrent syncope. EXAM: CT HEAD WITHOUT CONTRAST TECHNIQUE: Contiguous axial images were obtained from the base of the skull through the vertex without intravenous contrast. COMPARISON:  None. FINDINGS: Brain: No evidence of acute infarction, hemorrhage, hydrocephalus, extra-axial collection or mass lesion/mass effect. Vascular: Negative for hyperdense vessel Skull: Negative Sinuses/Orbits: Negative Other: None IMPRESSION: Negative CT head Electronically Signed   By: Franchot Gallo M.D.   On: 12/21/2020 15:31   MR BRAIN WO CONTRAST  Result Date: 12/22/2020 CLINICAL DATA:  Initial evaluation for acute syncope. EXAM: MRI HEAD WITHOUT CONTRAST TECHNIQUE: Multiplanar, multiecho pulse sequences of the brain and surrounding structures were obtained without intravenous contrast. COMPARISON:  Prior CT from 12/21/2020 FINDINGS: Brain: Generalized age-related cerebral atrophy. Scattered patchy T2/FLAIR hyperintensity seen within the periventricular, deep, and subcortical white matter both cerebral hemispheres, nonspecific, but most commonly related to chronic microvascular ischemic disease, mild for age. No abnormal foci of restricted diffusion to suggest acute or subacute ischemia. Gray-white matter differentiation maintained. No encephalomalacia to suggest chronic cortical infarction. No evidence for acute or chronic intracranial hemorrhage. No mass lesion, midline shift or mass effect. No hydrocephalus or extra-axial fluid collection. Pituitary gland suprasellar region within normal limits. Midline structures intact. Vascular: Major intracranial vascular flow voids are maintained. Skull  and upper cervical spine: Craniocervical junction within normal limits. Grade 1 anterolisthesis of C2 on C3 noted. Visualized upper cervical spine otherwise unremarkable. Bone marrow signal intensity within normal limits. Hyperostosis frontalis interna noted. Scalp soft tissues unremarkable. Sinuses/Orbits: Patient status post bilateral ocular lens replacement. Globes and orbital soft tissues demonstrate no acute finding. Paranasal sinuses are clear. Moderate left mastoid effusion noted. Inner ear structures grossly normal. Visualized nasopharynx unremarkable. Other: None. IMPRESSION: 1. No acute intracranial abnormality. 2. Mild age-related cerebral atrophy with chronic small vessel ischemic disease. 3. Moderate left mastoid effusion, of uncertain significance. Correlation with physical exam recommended. Electronically Signed   By: Jeannine Boga M.D.   On: 12/22/2020 04:08   US Venous Img Lower Bilateral  Result Date: 12/21/2020 CLINICAL DATA:  75 year old female with new swelling of the right lower extremity EXAM: BILATERAL LOWER EXTREMITY VENOUS DOPPLER ULTRASOUND TECHNIQUE: Gray-scale sonography with graded compression, as well as color Doppler and duplex ultrasound were performed to evaluate the lower extremity deep venous systems from the level of the common femoral vein and including the common femoral, femoral, profunda femoral, popliteal and calf veins including the posterior tibial, peroneal and gastrocnemius veins when visible. The superficial great saphenous  vein was also interrogated. Spectral Doppler was utilized to evaluate flow at rest and with distal augmentation maneuvers in the common femoral, femoral and popliteal veins. COMPARISON:  None. FINDINGS: RIGHT LOWER EXTREMITY Common Femoral Vein: No evidence of thrombus. Normal compressibility, respiratory phasicity and response to augmentation. Saphenofemoral Junction: No evidence of thrombus. Normal compressibility and flow on color  Doppler imaging. Profunda Femoral Vein: No evidence of thrombus. Normal compressibility and flow on color Doppler imaging. Femoral Vein: No evidence of thrombus. Normal compressibility, respiratory phasicity and response to augmentation. Popliteal Vein: No evidence of thrombus. Normal compressibility, respiratory phasicity and response to augmentation. Calf Veins: No evidence of thrombus. Normal compressibility and flow on color Doppler imaging. Superficial Great Saphenous Vein: No evidence of thrombus. Normal compressibility and flow on color Doppler imaging. Other Findings:  None. LEFT LOWER EXTREMITY Common Femoral Vein: No evidence of thrombus. Normal compressibility, respiratory phasicity and response to augmentation. Saphenofemoral Junction: No evidence of thrombus. Normal compressibility and flow on color Doppler imaging. Profunda Femoral Vein: No evidence of thrombus. Normal compressibility and flow on color Doppler imaging. Femoral Vein: No evidence of thrombus. Normal compressibility, respiratory phasicity and response to augmentation. Popliteal Vein: No evidence of thrombus. Normal compressibility, respiratory phasicity and response to augmentation. Calf Veins: No evidence of thrombus. Normal compressibility and flow on color Doppler imaging. Superficial Great Saphenous Vein: No evidence of thrombus. Normal compressibility and flow on color Doppler imaging. Other Findings:  None. IMPRESSION: Sonographic survey of the bilateral lower extremity negative for DVT Electronically Signed   By: Corrie Mckusick D.O.   On: 12/21/2020 14:34        Scheduled Meds:  enoxaparin (LOVENOX) injection  40 mg Subcutaneous Q24H   insulin aspart  0-5 Units Subcutaneous QHS   insulin aspart  0-9 Units Subcutaneous TID WC   metFORMIN  500 mg Oral Q supper   pantoprazole  40 mg Oral Daily   rosuvastatin  10 mg Oral QHS   sodium chloride flush  3 mL Intravenous Q12H   Continuous Infusions:   LOS: 0 days    Time  spent: 35 min    Desma Maxim, MD [facility]   If 7PM-7AM, please contact night-coverage [email or web address] Password [facility] 12/22/2020, 7:42 AM

## 2020-12-22 NOTE — ED Notes (Signed)
fsbs 143

## 2020-12-22 NOTE — Discharge Summary (Signed)
Erin Middleton RFX:[id number] DOB: March 05, 1946 DOA: 12/21/2020  PCP: Jerrol Banana., MD  Admit date: 12/21/2020 Discharge date: 12/22/2020  Time spent: 35 minutes  Recommendations for Outpatient Follow-up:  Pcp f/u 1-2 week Outpatient stress test as scheduled next week, then f/u with cardiology     Discharge Diagnoses:  Principal Problem:   Syncope Active Problems:   Acid reflux   Plantar fasciitis   Dupuytren's contracture of foot   Hypertension   OSA on CPAP   PAH (pulmonary artery hypertension) ([place])   T2DM (type 2 diabetes mellitus) ([place])   Bicuspid aortic valve   Discharge Condition: stable  Diet recommendation: heart healthy  There were no vitals filed for this visit.  History of present illness:   Erin Middleton is a 75 y.o. female with medical history significant for hypertension, hyperlipidemia, non-insulin-dependent diabetes mellitus, GERD, pulmonary hypertension, on CPAP at night, presents emergency department for chief concerns of syncope.   She was walking with small amount of laundry in her hand and she passed out. Suddenly she felt off and knew something was going to happen and the next thing she knew she was laying on the floor. She woke up laying on her right side.   She endorses lost of consciousness however does not know how long she was out for.  She does not feel like she was out for very long.   She denies chest pain, shortness of breath, abdominal pain, nausea, vomiting, odynophagia. She denies fever, cough, headaches, swelling in her lower extremities.   She endorses intermittent dysphagia with meats and breath. She had not had this evaluated and has not had endoscopy.   She denies dysuria, polyuria, diarrhea.   She reports that several years ago, she had dizziness and no syncpe. This is the first time she expereimced passing out.    Social history: She lives by herself. She is widowed for 5 months. Her husband passed from MI from covid  complications. She denies tobacco, etoh, recreational drug use. She is retired and previously worked as an Software engineer for [employer].  Hospital Course:  # Syncope Etiology unclear. Possibly orthostatic med side effect as bp here low normal and is on 3 antihypertensives at home which she hasn't taken in a couple of days. Hx osa, phtn, tricuspid regurg. Ekg shows first degree av block is at risk for conduction disturbance. Here tte has been performed, no sig changes from prior. No events on tele. Troponins negative. Dimer and dvt eval wnl. Mri w/o acute events. Electrolytes wnl, no anemia. Orthostats negative, ambulated without significant problem. Evaluated by cardiology, deemed ok for discharge - outpatient stress test previously scheduled for next week - hold amlodipine and hctz, monitor home bp, continue home ARB, f/u pcp in 1-2 weeks    Procedures: none   Consultations: cardiology  Discharge Exam: Vitals:   12/22/20 1541 12/22/20 1545  BP: (!) 126/56   Pulse: 70   Resp: 20   Temp:    SpO2: 95% 95%    General exam: Appears calm and comfortable Respiratory system: Clear to auscultation. Respiratory effort normal. Cardiovascular system: S1 & S2 heard, RRR. Mod systolic murmur Gastrointestinal system: Abdomen is nondistended, soft and nontender. No organomegaly or masses felt. Normal bowel sounds heard. Central nervous system: Alert and oriented. No focal neurological deficits. Extremities: Symmetric 5 x 5 power. Skin: No rashes, lesions or ulcers Psychiatry: Judgement and insight appear normal. Mood & affect appropriate.  Discharge Instructions   Discharge  Instructions     Diet - low sodium heart healthy   Complete by: As directed    Increase activity slowly   Complete by: As directed       Allergies as of 12/22/2020   No Known Allergies      Medication List     STOP taking these medications    amLODipine 5 MG tablet Commonly  known as: NORVASC   hydrochlorothiazide 12.5 MG capsule Commonly known as: MICROZIDE       TAKE these medications    Accu-Chek Guide test strip Generic drug: glucose blood CHECK BLOOD SUGAR EVERY DAY   Accu-Chek Softclix Lancets lancets CHECK BLOOD SUGARL ONCE DAILY   blood glucose meter kit and supplies Dispense based on patient and insurance preference. Check blood sugar once a day (FOR ICD-10 E10.9, E11.9).   cholecalciferol 25 MCG (1000 UNIT) tablet Commonly known as: VITAMIN D3 Take 1,000 Units by mouth daily.   metFORMIN 500 MG tablet Commonly known as: GLUCOPHAGE Take 1 tablet (500 mg total) by mouth daily with supper.   omeprazole 20 MG capsule Commonly known as: PRILOSEC TAKE 1 CAPSULE BY MOUTH EVERY DAY IN THE MORNING What changed: See the new instructions.   PreserVision/Lutein Caps Take 1 capsule by mouth at bedtime.   rosuvastatin 10 MG tablet Commonly known as: CRESTOR Take 10 mg by mouth daily.   telmisartan 80 MG tablet Commonly known as: MICARDIS TAKE 1 TABLET BY MOUTH DAILY   vitamin E 180 MG (400 UNITS) capsule Take 400 Units by mouth daily. Reported on 09/15/2015       No Known Allergies  Follow-up Information     Jerrol Banana., MD. Schedule an appointment as soon as possible for a visit.   Specialty: Family Medicine Contact information: [address] [place] [address] [phone]                  The results of significant diagnostics from this hospitalization (including imaging, microbiology, ancillary and laboratory) are listed below for reference.    Significant Diagnostic Studies: X-ray chest PA and lateral  Result Date: 12/21/2020 CLINICAL DATA:  Syncopal episode yesterday, initial encounter EXAM: CHEST - 2 VIEW COMPARISON:  10/03/2018 FINDINGS: Cardiac shadow is stable. Postsurgical changes in the left axilla are noted. Lungs are well aerated bilaterally with mild bibasilar scarring. No focal  infiltrate or effusion is seen. No bony abnormality is noted. IMPRESSION: Mild bibasilar scarring stable from the previous exam Electronically Signed   By: Inez Catalina M.D.   On: 12/21/2020 16:27   CT HEAD WO CONTRAST  Result Date: 12/21/2020 CLINICAL DATA:  Recurrent syncope. EXAM: CT HEAD WITHOUT CONTRAST TECHNIQUE: Contiguous axial images were obtained from the base of the skull through the vertex without intravenous contrast. COMPARISON:  None. FINDINGS: Brain: No evidence of acute infarction, hemorrhage, hydrocephalus, extra-axial collection or mass lesion/mass effect. Vascular: Negative for hyperdense vessel Skull: Negative Sinuses/Orbits: Negative Other: None IMPRESSION: Negative CT head Electronically Signed   By: Franchot Gallo M.D.   On: 12/21/2020 15:31   MR BRAIN WO CONTRAST  Result Date: 12/22/2020 CLINICAL DATA:  Initial evaluation for acute syncope. EXAM: MRI HEAD WITHOUT CONTRAST TECHNIQUE: Multiplanar, multiecho pulse sequences of the brain and surrounding structures were obtained without intravenous contrast. COMPARISON:  Prior CT from 12/21/2020 FINDINGS: Brain: Generalized age-related cerebral atrophy. Scattered patchy T2/FLAIR hyperintensity seen within the periventricular, deep, and subcortical white matter both cerebral hemispheres, nonspecific, but most commonly related to chronic microvascular ischemic  disease, mild for age. No abnormal foci of restricted diffusion to suggest acute or subacute ischemia. Gray-white matter differentiation maintained. No encephalomalacia to suggest chronic cortical infarction. No evidence for acute or chronic intracranial hemorrhage. No mass lesion, midline shift or mass effect. No hydrocephalus or extra-axial fluid collection. Pituitary gland suprasellar region within normal limits. Midline structures intact. Vascular: Major intracranial vascular flow voids are maintained. Skull and upper cervical spine: Craniocervical junction within normal limits.  Grade 1 anterolisthesis of C2 on C3 noted. Visualized upper cervical spine otherwise unremarkable. Bone marrow signal intensity within normal limits. Hyperostosis frontalis interna noted. Scalp soft tissues unremarkable. Sinuses/Orbits: Patient status post bilateral ocular lens replacement. Globes and orbital soft tissues demonstrate no acute finding. Paranasal sinuses are clear. Moderate left mastoid effusion noted. Inner ear structures grossly normal. Visualized nasopharynx unremarkable. Other: None. IMPRESSION: 1. No acute intracranial abnormality. 2. Mild age-related cerebral atrophy with chronic small vessel ischemic disease. 3. Moderate left mastoid effusion, of uncertain significance. Correlation with physical exam recommended. Electronically Signed   By: Jeannine Boga M.D.   On: 12/22/2020 04:08   US Venous Img Lower Bilateral  Result Date: 12/21/2020 CLINICAL DATA:  75 year old female with new swelling of the right lower extremity EXAM: BILATERAL LOWER EXTREMITY VENOUS DOPPLER ULTRASOUND TECHNIQUE: Gray-scale sonography with graded compression, as well as color Doppler and duplex ultrasound were performed to evaluate the lower extremity deep venous systems from the level of the common femoral vein and including the common femoral, femoral, profunda femoral, popliteal and calf veins including the posterior tibial, peroneal and gastrocnemius veins when visible. The superficial great saphenous vein was also interrogated. Spectral Doppler was utilized to evaluate flow at rest and with distal augmentation maneuvers in the common femoral, femoral and popliteal veins. COMPARISON:  None. FINDINGS: RIGHT LOWER EXTREMITY Common Femoral Vein: No evidence of thrombus. Normal compressibility, respiratory phasicity and response to augmentation. Saphenofemoral Junction: No evidence of thrombus. Normal compressibility and flow on color Doppler imaging. Profunda Femoral Vein: No evidence of thrombus. Normal  compressibility and flow on color Doppler imaging. Femoral Vein: No evidence of thrombus. Normal compressibility, respiratory phasicity and response to augmentation. Popliteal Vein: No evidence of thrombus. Normal compressibility, respiratory phasicity and response to augmentation. Calf Veins: No evidence of thrombus. Normal compressibility and flow on color Doppler imaging. Superficial Great Saphenous Vein: No evidence of thrombus. Normal compressibility and flow on color Doppler imaging. Other Findings:  None. LEFT LOWER EXTREMITY Common Femoral Vein: No evidence of thrombus. Normal compressibility, respiratory phasicity and response to augmentation. Saphenofemoral Junction: No evidence of thrombus. Normal compressibility and flow on color Doppler imaging. Profunda Femoral Vein: No evidence of thrombus. Normal compressibility and flow on color Doppler imaging. Femoral Vein: No evidence of thrombus. Normal compressibility, respiratory phasicity and response to augmentation. Popliteal Vein: No evidence of thrombus. Normal compressibility, respiratory phasicity and response to augmentation. Calf Veins: No evidence of thrombus. Normal compressibility and flow on color Doppler imaging. Superficial Great Saphenous Vein: No evidence of thrombus. Normal compressibility and flow on color Doppler imaging. Other Findings:  None. IMPRESSION: Sonographic survey of the bilateral lower extremity negative for DVT Electronically Signed   By: Corrie Mckusick D.O.   On: 12/21/2020 14:34   ECHOCARDIOGRAM COMPLETE  Result Date: 12/22/2020    ECHOCARDIOGRAM REPORT   Patient Name:   Erin Middleton [facility] Date of Exam: 12/21/2020 Medical Rec #:  [id number]       Height:       63.0 in Accession #:  [id number]      Weight:       160.8 lb Date of Birth:  1945/09/22       BSA:          1.762 m Patient Age:    75 years        BP:           115/55 mmHg Patient Gender: F               HR:           62 bpm. Exam Location:  [facility] Procedure: 2D Echo,  Cardiac Doppler and Color Doppler Indications:     R55 Syncope  History:         Patient has no prior history of Echocardiogram examinations.                  Risk Factors:Hypertension. History of breast cancer.  Sonographer:     Wilford Sports Rodgers-Jones Referring Phys:  [id number] AMY N [facility] Diagnosing Phys: Serafina Royals MD IMPRESSIONS  1. Left ventricular ejection fraction, by estimation, is 60 to 65%. The left ventricle has normal function. The left ventricle has no regional wall motion abnormalities. Left ventricular diastolic parameters were normal.  2. Right ventricular systolic function is normal. The right ventricular size is normal.  3. The mitral valve is normal in structure. Mild mitral valve regurgitation.  4. Tricuspid valve regurgitation is moderate.  5. The aortic valve is bicuspid. Aortic valve regurgitation is mild. Mild to moderate aortic valve sclerosis/calcification is present, without any evidence of aortic stenosis.  6. Aortic dilatation noted. There is mild dilatation of the aortic root. FINDINGS  Left Ventricle: Left ventricular ejection fraction, by estimation, is 60 to 65%. The left ventricle has normal function. The left ventricle has no regional wall motion abnormalities. The left ventricular internal cavity size was normal in size. There is  no left ventricular hypertrophy. Left ventricular diastolic parameters were normal. Right Ventricle: The right ventricular size is normal. No increase in right ventricular wall thickness. Right ventricular systolic function is normal. Left Atrium: Left atrial size was normal in size. Right Atrium: Right atrial size was normal in size. Pericardium: There is no evidence of pericardial effusion. Mitral Valve: The mitral valve is normal in structure. Mild mitral valve regurgitation. Tricuspid Valve: The tricuspid valve is normal in structure. Tricuspid valve regurgitation is moderate. Aortic Valve: The aortic valve is bicuspid. Aortic valve regurgitation is  mild. Aortic regurgitation PHT measures 428 msec. Mild to moderate aortic valve sclerosis/calcification is present, without any evidence of aortic stenosis. Pulmonic Valve: The pulmonic valve was normal in structure. Pulmonic valve regurgitation is not visualized. Aorta: Aortic dilatation noted. There is mild dilatation of the aortic root. IAS/Shunts: No atrial level shunt detected by color flow Doppler.  LEFT VENTRICLE PLAX 2D LVIDd:         3.30 cm  Diastology LVIDs:         2.20 cm  LV e' medial:    4.90 cm/s LV PW:         1.00 cm  LV E/e' medial:  24.9 LV IVS:        1.00 cm  LV e' lateral:   7.40 cm/s LVOT diam:     1.70 cm  LV E/e' lateral: 16.5 LV SV:         64 LV SV Index:   36 LVOT Area:     2.27 cm  RIGHT VENTRICLE RV Basal diam:  4.10  cm RV S prime:     16.50 cm/s TAPSE (M-mode): 2.1 cm LEFT ATRIUM             Index       RIGHT ATRIUM           Index LA diam:        4.20 cm 2.38 cm/m  RA Area:     13.10 cm LA Vol (A2C):   33.4 ml 18.95 ml/m RA Volume:   34.70 ml  19.69 ml/m LA Vol (A4C):   37.4 ml 21.22 ml/m LA Biplane Vol: 36.1 ml 20.48 ml/m  AORTIC VALVE LVOT Vmax:   129.00 cm/s LVOT Vmean:  89.900 cm/s LVOT VTI:    0.281 m AI PHT:      428 msec  AORTA Ao Root diam: 3.10 cm Ao Asc diam:  3.30 cm MITRAL VALVE                TRICUSPID VALVE MV Area (PHT): 1.91 cm     TR Peak grad:   30.0 mmHg MV Decel Time: 398 msec     TR Vmax:        274.00 cm/s MV E velocity: 122.00 cm/s MV A velocity: 160.00 cm/s  SHUNTS MV E/A ratio:  0.76         Systemic VTI:  0.28 m                             Systemic Diam: 1.70 cm Serafina Royals MD Electronically signed by Serafina Royals MD Signature Date/Time: 12/22/2020/8:54:58 AM    Final     Microbiology: Recent Results (from the past 240 hour(s))  Resp Panel by RT-PCR (Flu A&B, Covid) Nasopharyngeal Swab     Status: None   Collection Time: 12/21/20 12:45 PM   Specimen: Nasopharyngeal Swab; Nasopharyngeal(NP) swabs in vial transport medium  Result Value Ref  Range Status   SARS Coronavirus 2 by RT PCR NEGATIVE NEGATIVE Final    Comment: (NOTE) SARS-CoV-2 target nucleic acids are NOT DETECTED.  The SARS-CoV-2 RNA is generally detectable in upper respiratory specimens during the acute phase of infection. The lowest concentration of SARS-CoV-2 viral copies this assay can detect is 138 copies/mL. A negative result does not preclude SARS-Cov-2 infection and should not be used as the sole basis for treatment or other patient management decisions. A negative result may occur with  improper specimen collection/handling, submission of specimen other than nasopharyngeal swab, presence of viral mutation(s) within the areas targeted by this assay, and inadequate number of viral copies(<138 copies/mL). A negative result must be combined with clinical observations, patient history, and epidemiological information. The expected result is Negative.  Fact Sheet for Patients:  EntrepreneurPulse.com.au  Fact Sheet for Healthcare Providers:  IncredibleEmployment.be  This test is no t yet approved or cleared by the [place] FDA and  has been authorized for detection and/or diagnosis of SARS-CoV-2 by FDA under an Emergency Use Authorization (EUA). This EUA will remain  in effect (meaning this test can be used) for the duration of the COVID-19 declaration under Section 564(b)(1) of the Act, 21 U.S.C.section 360bbb-3(b)(1), unless the authorization is terminated  or revoked sooner.       Influenza A by PCR NEGATIVE NEGATIVE Final   Influenza B by PCR NEGATIVE NEGATIVE Final    Comment: (NOTE) The Xpert Xpress SARS-CoV-2/FLU/RSV plus assay is intended as an aid in the diagnosis of influenza from Nasopharyngeal swab specimens  and should not be used as a sole basis for treatment. Nasal washings and aspirates are unacceptable for Xpert Xpress SARS-CoV-2/FLU/RSV testing.  Fact Sheet for  Patients: EntrepreneurPulse.com.au  Fact Sheet for Healthcare Providers: IncredibleEmployment.be  This test is not yet approved or cleared by the [place] FDA and has been authorized for detection and/or diagnosis of SARS-CoV-2 by FDA under an Emergency Use Authorization (EUA). This EUA will remain in effect (meaning this test can be used) for the duration of the COVID-19 declaration under Section 564(b)(1) of the Act, 21 U.S.C. section 360bbb-3(b)(1), unless the authorization is terminated or revoked.  Performed at [facility], [address] [place],  [address]      Labs: Basic Metabolic Panel: Recent Labs  Lab 12/21/20 1037  NA 137  K 3.9  CL 104  CO2 24  GLUCOSE 155*  BUN 30*  CREATININE 0.85  CALCIUM 9.4   Liver Function Tests: No results for input(s): AST, ALT, ALKPHOS, BILITOT, PROT, ALBUMIN in the last 168 hours. No results for input(s): LIPASE, AMYLASE in the last 168 hours. No results for input(s): AMMONIA in the last 168 hours. CBC: Recent Labs  Lab 12/21/20 1037  WBC 8.8  HGB 13.9  HCT 40.7  MCV 92.9  PLT 211   Cardiac Enzymes: No results for input(s): CKTOTAL, CKMB, CKMBINDEX, TROPONINI in the last 168 hours. BNP: BNP (last 3 results) No results for input(s): BNP in the last 8760 hours.  ProBNP (last 3 results) No results for input(s): PROBNP in the last 8760 hours.  CBG: Recent Labs  Lab 12/22/20 0105 12/22/20 0756 12/22/20 1211  GLUCAP 143* 149* 129*       Signed:  Desma Maxim MD.  [facility] 12/22/2020, 4:17 PM

## 2020-12-22 NOTE — ED Notes (Signed)
Pt in mri 

## 2020-12-26 ENCOUNTER — Inpatient Hospital Stay
Admission: EM | Admit: 2020-12-26 | Discharge: 2020-12-28 | DRG: 229 | Disposition: A | Discharge status: Home / Self Care | Payer: Medicare PPO | Attending: Internal Medicine | Admitting: Internal Medicine

## 2020-12-26 ENCOUNTER — Encounter
Admission: EM | Disposition: A | Discharge status: Home / Self Care | Payer: Self-pay | Source: Home / Self Care | Attending: Internal Medicine

## 2020-12-26 ENCOUNTER — Other Ambulatory Visit: Payer: Self-pay

## 2020-12-26 DIAGNOSIS — Z853 Personal history of malignant neoplasm of breast: Secondary | ICD-10-CM

## 2020-12-26 DIAGNOSIS — Z96652 Presence of left artificial knee joint: Secondary | ICD-10-CM | POA: Diagnosis present

## 2020-12-26 DIAGNOSIS — I440 Atrioventricular block, first degree: Secondary | ICD-10-CM | POA: Diagnosis not present

## 2020-12-26 DIAGNOSIS — R55 Syncope and collapse: Secondary | ICD-10-CM

## 2020-12-26 DIAGNOSIS — I4430 Unspecified atrioventricular block: Secondary | ICD-10-CM | POA: Diagnosis not present

## 2020-12-26 DIAGNOSIS — Z8249 Family history of ischemic heart disease and other diseases of the circulatory system: Secondary | ICD-10-CM

## 2020-12-26 DIAGNOSIS — Z9221 Personal history of antineoplastic chemotherapy: Secondary | ICD-10-CM | POA: Diagnosis not present

## 2020-12-26 DIAGNOSIS — Z9012 Acquired absence of left breast and nipple: Secondary | ICD-10-CM

## 2020-12-26 DIAGNOSIS — Z79899 Other long term (current) drug therapy: Secondary | ICD-10-CM | POA: Diagnosis not present

## 2020-12-26 DIAGNOSIS — I959 Hypotension, unspecified: Secondary | ICD-10-CM | POA: Diagnosis not present

## 2020-12-26 DIAGNOSIS — I110 Hypertensive heart disease with heart failure: Secondary | ICD-10-CM | POA: Diagnosis not present

## 2020-12-26 DIAGNOSIS — I442 Atrioventricular block, complete: Secondary | ICD-10-CM | POA: Diagnosis not present

## 2020-12-26 DIAGNOSIS — I509 Heart failure, unspecified: Secondary | ICD-10-CM | POA: Diagnosis not present

## 2020-12-26 DIAGNOSIS — K219 Gastro-esophageal reflux disease without esophagitis: Secondary | ICD-10-CM | POA: Diagnosis not present

## 2020-12-26 DIAGNOSIS — I272 Pulmonary hypertension, unspecified: Secondary | ICD-10-CM | POA: Diagnosis present

## 2020-12-26 DIAGNOSIS — I083 Combined rheumatic disorders of mitral, aortic and tricuspid valves: Secondary | ICD-10-CM | POA: Diagnosis present

## 2020-12-26 DIAGNOSIS — Z7984 Long term (current) use of oral hypoglycemic drugs: Secondary | ICD-10-CM

## 2020-12-26 DIAGNOSIS — R001 Bradycardia, unspecified: Secondary | ICD-10-CM | POA: Diagnosis not present

## 2020-12-26 HISTORY — PX: TEMPORARY PACEMAKER: CATH118268

## 2020-12-26 LAB — BASIC METABOLIC PANEL
Anion gap: 9 (ref 5–15)
BUN: 15 mg/dL (ref 8–23)
CO2: 22 mmol/L (ref 22–32)
Calcium: 8.8 mg/dL — ABNORMAL LOW (ref 8.9–10.3)
Chloride: 108 mmol/L (ref 98–111)
Creatinine, Ser: 0.92 mg/dL (ref 0.44–1.00)
GFR, Estimated: >60 mL/min (ref >60)
Glucose, Bld: 148 mg/dL — ABNORMAL HIGH (ref 70–99)
Potassium: 4 mmol/L (ref 3.5–5.1)
Sodium: 139 mmol/L (ref 135–145)

## 2020-12-26 LAB — CBC WITH DIFFERENTIAL/PLATELET
Abs Immature Granulocytes: 0.02 10*3/uL (ref 0.00–0.07)
Basophils Absolute: 0 10*3/uL (ref 0.0–0.1)
Basophils Relative: 0 %
Eosinophils Absolute: 0.3 10*3/uL (ref 0.0–0.5)
Eosinophils Relative: 4 %
HCT: 38 % (ref 36.0–46.0)
Hemoglobin: 13.2 g/dL (ref 12.0–15.0)
Immature Granulocytes: 0 %
Lymphocytes Relative: 28 %
Lymphs Abs: 2.1 10*3/uL (ref 0.7–4.0)
MCH: 32.1 pg (ref 26.0–34.0)
MCHC: 34.7 g/dL (ref 30.0–36.0)
MCV: 92.5 fL (ref 80.0–100.0)
Monocytes Absolute: 0.4 10*3/uL (ref 0.1–1.0)
Monocytes Relative: 6 %
Neutro Abs: 4.5 10*3/uL (ref 1.7–7.7)
Neutrophils Relative %: 62 %
Platelets: 206 10*3/uL (ref 150–400)
RBC: 4.11 MIL/uL (ref 3.87–5.11)
RDW: 13.1 % (ref 11.5–15.5)
WBC: 7.4 10*3/uL (ref 4.0–10.5)
nRBC: 0 % (ref 0.0–0.2)

## 2020-12-26 LAB — TROPONIN I (HIGH SENSITIVITY)
Troponin I (High Sensitivity): 5 ng/L (ref <18)
Troponin I (High Sensitivity): 39 ng/L — ABNORMAL HIGH (ref <18)

## 2020-12-26 LAB — SURGICAL PCR SCREEN
MRSA, PCR: NEGATIVE
Staphylococcus aureus: NEGATIVE

## 2020-12-26 SURGERY — TEMPORARY PACEMAKER

## 2020-12-26 MED ORDER — SODIUM CHLORIDE 0.45 % IV SOLN: INTRAVENOUS | Status: DC

## 2020-12-26 MED ORDER — MIDAZOLAM HCL 2 MG/2ML IJ SOLN
INTRAMUSCULAR | Status: AC
  Filled 2020-12-26: qty 2

## 2020-12-26 MED ORDER — FENTANYL CITRATE (PF) 100 MCG/2ML IJ SOLN
INTRAMUSCULAR | Status: AC
  Filled 2020-12-26: qty 2

## 2020-12-26 MED ORDER — MIDAZOLAM HCL 2 MG/2ML IJ SOLN
INTRAMUSCULAR | Status: DC | PRN
  Administered 2020-12-26: 1 mg via INTRAVENOUS

## 2020-12-26 MED ORDER — SODIUM CHLORIDE 0.9 % IV SOLN: INTRAVENOUS | Status: DC

## 2020-12-26 MED ORDER — CEFAZOLIN SODIUM-DEXTROSE 2-4 GM/100ML-% IV SOLN: 2.0000 g | INTRAVENOUS | Status: DC

## 2020-12-26 MED ORDER — LIDOCAINE HCL (PF) 1 % IJ SOLN
INTRAMUSCULAR | Status: AC
  Filled 2020-12-26: qty 30

## 2020-12-26 MED ORDER — LIDOCAINE HCL (PF) 1 % IJ SOLN
INTRAMUSCULAR | Status: DC | PRN
  Administered 2020-12-26: 5 mL

## 2020-12-26 MED ORDER — HEPARIN (PORCINE) IN NACL 1000-0.9 UT/500ML-% IV SOLN
INTRAVENOUS | Status: DC | PRN
  Administered 2020-12-26: 500 mL

## 2020-12-26 MED ORDER — SODIUM CHLORIDE 0.9 % IV SOLN
INTRAVENOUS | Status: AC | PRN
  Administered 2020-12-26: 10 mL/h via INTRAVENOUS

## 2020-12-26 MED ORDER — FENTANYL CITRATE (PF) 100 MCG/2ML IJ SOLN
INTRAMUSCULAR | Status: DC | PRN
  Administered 2020-12-26: 25 ug via INTRAVENOUS

## 2020-12-26 SURGICAL SUPPLY — 8 items
CABLE ADAPT PACING TEMP 12FT (ADAPTER) ×2 IMPLANT
COVER PROBE U/S 5X48 (MISCELLANEOUS) ×2 IMPLANT
NEEDLE PERC 18GX7CM (NEEDLE) ×2 IMPLANT
PACK CARDIAC CATH (CUSTOM PROCEDURE TRAY) ×2 IMPLANT
SHEATH AVANTI 6FR X 11CM (SHEATH) ×2 IMPLANT
SLEEVE REPOSITIONING LENGTH 30 (MISCELLANEOUS) ×2 IMPLANT
SUT SILK 0 SH 30 (SUTURE) ×2 IMPLANT
WIRE PACING TEMP ST TIP 5 (CATHETERS) ×2 IMPLANT

## 2020-12-26 NOTE — Progress Notes (Signed)
Patient is now describing feeling as her heart is flipping, continues to deny any pain.

## 2020-12-26 NOTE — Progress Notes (Signed)
Patient is currently ventricular paced at 50, she called out com[plaining of SOB, denies any pain, PO2 sats 100%, all VS WNL. Patient states she has felt this in the past. Benjamine Mola NP aware.

## 2020-12-26 NOTE — Consult Note (Signed)
[name] Cardiology  CARDIOLOGY CONSULT NOTE  Patient ID: Erin Middleton MRN: [id number] DOB/AGE: 1946-06-23 75 y.o.  Admit date: 12/26/2020 Referring Physician Tamala Julian Primary Physician Rosanna [name] Primary Cardiologist Fath Reason for Consultation complete heart block  HPI: 75 year old female referred for complete heart block.  The patient was recently hospitalized 12/21/2020 with intermittent dizziness, presyncope, and syncope.  She had an uncomplicated hospital stay without evidence for significant bradycardia or tachyarrhythmia on ECG or telemetry.  2D echocardiogram revealed normal left ventricular function, with bicuspid aortic valve.  Head CT and brain MRI were unremarkable.  The patient was discharged home, scheduled for outpatient stress test.  Reports she is continue to experience intermittent episodes of dizziness 3-4 times daily.  Today she experienced a near syncopal episode and presented to [facility] ED.  In the ED, the patient variance a 10-second episode of high-grade AV block, as well as, intermittent 4-1 AV block.  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  The patient denies chest pain or shortness of breath.  High-sensitivity troponin was normal at 5.  Electrolytes are within normal limits.  Review of systems complete and found to be negative unless listed above     Past Medical History:  Diagnosis Date   Arthritis    Breast cancer ([place]) 1985   left breast cancer - chemotherapy   Cancer ([place])    Breast   GERD (gastroesophageal reflux disease)    Headache    migraines h/o   Hypertension    Personal history of chemotherapy     Past Surgical History:  Procedure Laterality Date   COLONOSCOPY WITH PROPOFOL N/A 04/01/2015   Procedure: COLONOSCOPY WITH PROPOFOL;  Surgeon: Hulen Luster, MD;  Location: [facility] ENDOSCOPY;  Service: Gastroenterology;  Laterality: N/A;   COLONOSCOPY WITH PROPOFOL N/A 11/09/2020   Procedure: COLONOSCOPY WITH PROPOFOL;  Surgeon: Lesly Rubenstein, MD;  Location: [facility] ENDOSCOPY;  Service: Endoscopy;  Laterality: N/A;   DILATION AND CURETTAGE OF UTERUS     MASTECTOMY Left    NECK SURGERY     neck deformity-straighten out neck as a child   PALATE / UVULA BIOPSY / EXCISION     TOTAL KNEE ARTHROPLASTY Left 09/22/2015   Procedure: TOTAL KNEE ARTHROPLASTY;  Surgeon: Earnestine Leys, MD;  Location: [facility] ORS;  Service: Orthopedics;  Laterality: Left;   US CAROTID DOPPLER BILATERAL ([place] HX)      (Not in a hospital admission)  Social History   Socioeconomic History   Marital status: Widowed    Spouse name: Not on file   Number of children: 2   Years of education: Not on file   Highest education level: Some college, no degree  Occupational History   Occupation: retired  Tobacco Use   Smoking status: Never   Smokeless tobacco: Never  Vaping Use   Vaping Use: Never used  Substance and Sexual Activity   Alcohol use: No    Alcohol/week: 0.0 standard drinks   Drug use: No   Sexual activity: Never  Other Topics Concern   Not on file  Social History Narrative   Not on file   Social Determinants of Health   Financial Resource Strain: Not on file  Food Insecurity: Not on file  Transportation Needs: Not on file  Physical Activity: Not on file  Stress: Not on file  Social Connections: Not on file  Intimate Partner Violence: Not on file    Family History  Problem Relation Age of Onset  Diabetes Mother    Stroke Mother    Dementia Mother    Lung cancer Father    Heart disease Father    Stroke Brother    Diabetes Brother    Throat cancer Brother    Diabetes Brother    Breast cancer Neg Hx       Review of systems complete and found to be negative unless listed above      PHYSICAL EXAM  General: Well developed, well nourished, in no acute distress HEENT:  Normocephalic and atramatic Neck:  No JVD.  Lungs: Clear bilaterally to auscultation and percussion. Heart: HRRR . Normal S1 and S2 without gallops or murmurs.   Abdomen: Bowel sounds are positive, abdomen soft and non-tender  Msk:  Back normal, normal gait. Normal strength and tone for age. Extremities: No clubbing, cyanosis or edema.   Neuro: Alert and oriented X 3. Psych:  Good affect, responds appropriately  Labs:   Lab Results  Component Value Date   WBC 7.4 12/26/2020   HGB 13.2 12/26/2020   HCT 38.0 12/26/2020   MCV 92.5 12/26/2020   PLT 206 12/26/2020    Recent Labs  Lab 12/26/20 1102  NA 139  K 4.0  CL 108  CO2 22  BUN 15  CREATININE 0.92  CALCIUM 8.8*  GLUCOSE 148*   No results found for: CKTOTAL, CKMB, CKMBINDEX, TROPONINI  Lab Results  Component Value Date   CHOL 120 08/03/2020   CHOL 208 (H) 07/30/2019   CHOL 160 07/10/2018   Lab Results  Component Value Date   HDL 57 08/03/2020   HDL 56 07/30/2019   HDL 51 07/10/2018   Lab Results  Component Value Date   LDLCALC 47 08/03/2020   LDLCALC 129 (H) 07/30/2019   LDLCALC 92 07/10/2018   Lab Results  Component Value Date   TRIG 81 08/03/2020   TRIG 127 07/30/2019   TRIG 86 07/10/2018   Lab Results  Component Value Date   CHOLHDL 2.1 08/03/2020   CHOLHDL 3.7 07/30/2019   CHOLHDL 2.8 06/06/2017   No results found for: LDLDIRECT    Radiology: X-ray chest PA and lateral  Result Date: 12/21/2020 CLINICAL DATA:  Syncopal episode yesterday, initial encounter EXAM: CHEST - 2 VIEW COMPARISON:  10/03/2018 FINDINGS: Cardiac shadow is stable. Postsurgical changes in the left axilla are noted. Lungs are well aerated bilaterally with mild bibasilar scarring. No focal infiltrate or effusion is seen. No bony abnormality is noted. IMPRESSION: Mild bibasilar scarring stable from the previous exam Electronically Signed   By: Inez Catalina M.D.   On: 12/21/2020 16:27   CT HEAD WO CONTRAST  Result Date: 12/21/2020 CLINICAL DATA:  Recurrent syncope. EXAM: CT HEAD WITHOUT CONTRAST TECHNIQUE: Contiguous axial images were obtained from the base of the skull through the vertex  without intravenous contrast. COMPARISON:  None. FINDINGS: Brain: No evidence of acute infarction, hemorrhage, hydrocephalus, extra-axial collection or mass lesion/mass effect. Vascular: Negative for hyperdense vessel Skull: Negative Sinuses/Orbits: Negative Other: None IMPRESSION: Negative CT head Electronically Signed   By: Franchot Gallo M.D.   On: 12/21/2020 15:31   MR BRAIN WO CONTRAST  Result Date: 12/22/2020 CLINICAL DATA:  Initial evaluation for acute syncope. EXAM: MRI HEAD WITHOUT CONTRAST TECHNIQUE: Multiplanar, multiecho pulse sequences of the brain and surrounding structures were obtained without intravenous contrast. COMPARISON:  Prior CT from 12/21/2020 FINDINGS: Brain: Generalized age-related cerebral atrophy. Scattered patchy T2/FLAIR hyperintensity seen within the periventricular, deep, and subcortical white matter both cerebral hemispheres, nonspecific,  but most commonly related to chronic microvascular ischemic disease, mild for age. No abnormal foci of restricted diffusion to suggest acute or subacute ischemia. Gray-white matter differentiation maintained. No encephalomalacia to suggest chronic cortical infarction. No evidence for acute or chronic intracranial hemorrhage. No mass lesion, midline shift or mass effect. No hydrocephalus or extra-axial fluid collection. Pituitary gland suprasellar region within normal limits. Midline structures intact. Vascular: Major intracranial vascular flow voids are maintained. Skull and upper cervical spine: Craniocervical junction within normal limits. Grade 1 anterolisthesis of C2 on C3 noted. Visualized upper cervical spine otherwise unremarkable. Bone marrow signal intensity within normal limits. Hyperostosis frontalis interna noted. Scalp soft tissues unremarkable. Sinuses/Orbits: Patient status post bilateral ocular lens replacement. Globes and orbital soft tissues demonstrate no acute finding. Paranasal sinuses are clear. Moderate left mastoid  effusion noted. Inner ear structures grossly normal. Visualized nasopharynx unremarkable. Other: None. IMPRESSION: 1. No acute intracranial abnormality. 2. Mild age-related cerebral atrophy with chronic small vessel ischemic disease. 3. Moderate left mastoid effusion, of uncertain significance. Correlation with physical exam recommended. Electronically Signed   By: Jeannine Boga M.D.   On: 12/22/2020 04:08   US Venous Img Lower Bilateral  Result Date: 12/21/2020 CLINICAL DATA:  75 year old female with new swelling of the right lower extremity EXAM: BILATERAL LOWER EXTREMITY VENOUS DOPPLER ULTRASOUND TECHNIQUE: Gray-scale sonography with graded compression, as well as color Doppler and duplex ultrasound were performed to evaluate the lower extremity deep venous systems from the level of the common femoral vein and including the common femoral, femoral, profunda femoral, popliteal and calf veins including the posterior tibial, peroneal and gastrocnemius veins when visible. The superficial great saphenous vein was also interrogated. Spectral Doppler was utilized to evaluate flow at rest and with distal augmentation maneuvers in the common femoral, femoral and popliteal veins. COMPARISON:  None. FINDINGS: RIGHT LOWER EXTREMITY Common Femoral Vein: No evidence of thrombus. Normal compressibility, respiratory phasicity and response to augmentation. Saphenofemoral Junction: No evidence of thrombus. Normal compressibility and flow on color Doppler imaging. Profunda Femoral Vein: No evidence of thrombus. Normal compressibility and flow on color Doppler imaging. Femoral Vein: No evidence of thrombus. Normal compressibility, respiratory phasicity and response to augmentation. Popliteal Vein: No evidence of thrombus. Normal compressibility, respiratory phasicity and response to augmentation. Calf Veins: No evidence of thrombus. Normal compressibility and flow on color Doppler imaging. Superficial Great Saphenous  Vein: No evidence of thrombus. Normal compressibility and flow on color Doppler imaging. Other Findings:  None. LEFT LOWER EXTREMITY Common Femoral Vein: No evidence of thrombus. Normal compressibility, respiratory phasicity and response to augmentation. Saphenofemoral Junction: No evidence of thrombus. Normal compressibility and flow on color Doppler imaging. Profunda Femoral Vein: No evidence of thrombus. Normal compressibility and flow on color Doppler imaging. Femoral Vein: No evidence of thrombus. Normal compressibility, respiratory phasicity and response to augmentation. Popliteal Vein: No evidence of thrombus. Normal compressibility, respiratory phasicity and response to augmentation. Calf Veins: No evidence of thrombus. Normal compressibility and flow on color Doppler imaging. Superficial Great Saphenous Vein: No evidence of thrombus. Normal compressibility and flow on color Doppler imaging. Other Findings:  None. IMPRESSION: Sonographic survey of the bilateral lower extremity negative for DVT Electronically Signed   By: Corrie Mckusick D.O.   On: 12/21/2020 14:34   ECHOCARDIOGRAM COMPLETE  Result Date: 12/22/2020    ECHOCARDIOGRAM REPORT   Patient Name:   Erin Middleton [facility] Date of Exam: 12/21/2020 Medical Rec #:  [id number]       Height:  63.0 in Accession #:    [id number]      Weight:       160.8 lb Date of Birth:  Dec 16, 1945       BSA:          1.762 m Patient Age:    43 years        BP:           115/55 mmHg Patient Gender: F               HR:           62 bpm. Exam Location:  [facility] Procedure: 2D Echo, Cardiac Doppler and Color Doppler Indications:     R55 Syncope  History:         Patient has no prior history of Echocardiogram examinations.                  Risk Factors:Hypertension. History of breast cancer.  Sonographer:     Wilford Sports Rodgers-Jones Referring Phys:  [id number] AMY N COX Diagnosing Phys: Serafina Royals MD IMPRESSIONS  1. Left ventricular ejection fraction, by estimation, is 60 to 65%. The  left ventricle has normal function. The left ventricle has no regional wall motion abnormalities. Left ventricular diastolic parameters were normal.  2. Right ventricular systolic function is normal. The right ventricular size is normal.  3. The mitral valve is normal in structure. Mild mitral valve regurgitation.  4. Tricuspid valve regurgitation is moderate.  5. The aortic valve is bicuspid. Aortic valve regurgitation is mild. Mild to moderate aortic valve sclerosis/calcification is present, without any evidence of aortic stenosis.  6. Aortic dilatation noted. There is mild dilatation of the aortic root. FINDINGS  Left Ventricle: Left ventricular ejection fraction, by estimation, is 60 to 65%. The left ventricle has normal function. The left ventricle has no regional wall motion abnormalities. The left ventricular internal cavity size was normal in size. There is  no left ventricular hypertrophy. Left ventricular diastolic parameters were normal. Right Ventricle: The right ventricular size is normal. No increase in right ventricular wall thickness. Right ventricular systolic function is normal. Left Atrium: Left atrial size was normal in size. Right Atrium: Right atrial size was normal in size. Pericardium: There is no evidence of pericardial effusion. Mitral Valve: The mitral valve is normal in structure. Mild mitral valve regurgitation. Tricuspid Valve: The tricuspid valve is normal in structure. Tricuspid valve regurgitation is moderate. Aortic Valve: The aortic valve is bicuspid. Aortic valve regurgitation is mild. Aortic regurgitation PHT measures 428 msec. Mild to moderate aortic valve sclerosis/calcification is present, without any evidence of aortic stenosis. Pulmonic Valve: The pulmonic valve was normal in structure. Pulmonic valve regurgitation is not visualized. Aorta: Aortic dilatation noted. There is mild dilatation of the aortic root. IAS/Shunts: No atrial level shunt detected by color flow Doppler.   LEFT VENTRICLE PLAX 2D LVIDd:         3.30 cm  Diastology LVIDs:         2.20 cm  LV e' medial:    4.90 cm/s LV PW:         1.00 cm  LV E/e' medial:  24.9 LV IVS:        1.00 cm  LV e' lateral:   7.40 cm/s LVOT diam:     1.70 cm  LV E/e' lateral: 16.5 LV SV:         64 LV SV Index:   36 LVOT Area:     2.27 cm  RIGHT VENTRICLE RV Basal diam:  4.10 cm RV S prime:     16.50 cm/s TAPSE (M-mode): 2.1 cm LEFT ATRIUM             Index       RIGHT ATRIUM           Index LA diam:        4.20 cm 2.38 cm/m  RA Area:     13.10 cm LA Vol (A2C):   33.4 ml 18.95 ml/m RA Volume:   34.70 ml  19.69 ml/m LA Vol (A4C):   37.4 ml 21.22 ml/m LA Biplane Vol: 36.1 ml 20.48 ml/m  AORTIC VALVE LVOT Vmax:   129.00 cm/s LVOT Vmean:  89.900 cm/s LVOT VTI:    0.281 m AI PHT:      428 msec  AORTA Ao Root diam: 3.10 cm Ao Asc diam:  3.30 cm MITRAL VALVE                TRICUSPID VALVE MV Area (PHT): 1.91 cm     TR Peak grad:   30.0 mmHg MV Decel Time: 398 msec     TR Vmax:        274.00 cm/s MV E velocity: 122.00 cm/s MV A velocity: 160.00 cm/s  SHUNTS MV E/A ratio:  0.76         Systemic VTI:  0.28 m                             Systemic Diam: 1.70 cm Serafina Royals MD Electronically signed by Serafina Royals MD Signature Date/Time: 12/22/2020/8:54:58 AM    Final     EKG: Second-degree AV block at a rate of 46 bpm  ASSESSMENT AND PLAN:   1.  Intermittent complete heart block, high-grade AV block, and   second-degree AV block with associated presyncope and syncope  Recommendations  Urgent temporary transvenous pacemaker Micra AV leadless pacemaker scheduled for 12/27/2020.  The risk, benefits and alternatives of leadless pacemaker implantation were explained to the patient and informed written consent was obtained.  Signed: Isaias Cowman MD,PhD, [facility] 12/26/2020, 12:39 PM

## 2020-12-26 NOTE — ED Notes (Signed)
Cardiology at bedside at this time

## 2020-12-26 NOTE — Progress Notes (Signed)
Patient was a stemi page in ED earlier today. I followed up, patient sitting up in bed and talking with me, Daughter bedside. She says she is really glad to be alive and waiting for next step.

## 2020-12-26 NOTE — ED Provider Notes (Signed)
[facility] Emergency Department Provider Note ____________________________________________   Event Date/Time   First MD Initiated Contact with Patient 12/26/20 1046     (approximate)  I have reviewed the triage vital signs and the nursing notes.  HISTORY  Chief Complaint Near Syncope   HPI Erin Middleton is a 75 y.o. femalewho presents to the ED for evaluation of recurrent episodes of near syncope.   Chart review indicates history of HTN, breast cancer s/p chemo and mastectomy.  Pulmonary hypertension. Recent medical admission 6/21-6/22.  I saw this patient 5 days ago and recall her distinctly.  She had a syncopal episode and was admitted to observation with a benign work-up.  Seen by cardiology on 6/22 and deemed suitable for outpatient follow-up.  Echo with normal EF.  Tricuspid and mitral regurgitation without CHF.  Bicuspid aortic valve.  Patient presents to the ED via EMS from home due to recurrent episodes of near syncope.  She reports 3-4 episodes per day for the past 3 days of a similar constellation of symptoms.  She reports that she can be doing any activity, or just sitting there, and when she gets rapid sensation of presyncopal dizziness.  She says it is not vertigo or spinning, also says it is not lightheadedness, just "I am about to pass out."  She denies any full episodes of syncope, chest pain or other symptoms coexisting with this presyncope.  This morning, she has had a similar episode of this episode of near syncope, while seated watching television, and reports some mild chest tightness like a band across her lower chest, but is been constant since that time.  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   Hypertension 01/18/2015   Breast CA ([place]) 11/04/2014   Acid reflux 11/04/2014   Blood pressure elevated 11/04/2014   Plantar fasciitis 11/04/2014   H/O ear disorder 05/19/2014   Breast cancer, female ([place]) 05/19/2014   Dupuytren's contracture of foot 05/19/2014   Avitaminosis D 09/21/2008   Arthropathia 10/08/2006   History of colon polyps 07/19/2006    Past Surgical History:  Procedure Laterality Date   COLONOSCOPY WITH PROPOFOL N/A 04/01/2015   Procedure: COLONOSCOPY WITH PROPOFOL;  Surgeon: Hulen Luster, MD;  Location: [facility] ENDOSCOPY;  Service: Gastroenterology;  Laterality: N/A;   COLONOSCOPY WITH PROPOFOL N/A 11/09/2020   Procedure: COLONOSCOPY WITH PROPOFOL;  Surgeon: Lesly Rubenstein, MD;  Location: [facility] ENDOSCOPY;  Service: Endoscopy;  Laterality: N/A;   DILATION AND CURETTAGE OF UTERUS     MASTECTOMY Left    NECK SURGERY     neck deformity-straighten out neck as a child   PALATE / UVULA BIOPSY / EXCISION     TOTAL KNEE ARTHROPLASTY Left 09/22/2015   Procedure: TOTAL KNEE ARTHROPLASTY;  Surgeon: Earnestine Leys, MD;  Location: [facility] ORS;  Service: Orthopedics;  Laterality:  Left;   US CAROTID DOPPLER BILATERAL ([facility] HX)      Prior to Admission medications   Medication Sig Start Date End Date Taking? Authorizing Provider  ACCU-CHEK GUIDE test strip CHECK BLOOD SUGAR EVERY DAY 05/22/20   Maple Hudson., MD  Accu-Chek Softclix Lancets lancets CHECK BLOOD SUGARL ONCE DAILY 08/24/20   Maple Hudson., MD  blood glucose meter kit and supplies Dispense based on patient and insurance preference. Check blood sugar once a day (FOR  ICD-10 E10.9, E11.9). 03/01/20   Maple Hudson., MD  cholecalciferol (VITAMIN D3) 25 MCG (1000 UNIT) tablet Take 1,000 Units by mouth daily.    [provider]  metFORMIN (GLUCOPHAGE) 500 MG tablet Take 1 tablet (500 mg total) by mouth daily with supper. 03/01/20   Maple Hudson., MD  Multiple Vitamins-Minerals (PRESERVISION/LUTEIN) CAPS Take 1 capsule by mouth at bedtime.    [provider]  omeprazole (PRILOSEC) 20 MG capsule TAKE 1 CAPSULE BY MOUTH EVERY DAY IN THE MORNING 07/05/20   Maple Hudson., MD  rosuvastatin (CRESTOR) 10 MG tablet Take 10 mg by mouth daily.    [provider]  telmisartan (MICARDIS) 80 MG tablet TAKE 1 TABLET BY MOUTH DAILY 10/12/20   Maple Hudson., MD  vitamin E 400 UNIT capsule Take 400 Units by mouth daily. Reported on 09/15/2015    [provider]  amLODipine (NORVASC) 5 MG tablet TAKE 1 TABLET BY MOUTH EVERY DAY IN THE EVENING Patient taking differently: Take 5 mg by mouth every evening. 05/10/20 12/22/20  Maple Hudson., MD  hydrochlorothiazide (MICROZIDE) 12.5 MG capsule TAKE 1 CAPSULE BY MOUTH EVERY DAY Patient taking differently: Take 12.5 mg by mouth daily. 09/16/20 12/22/20  Maple Hudson., MD    Allergies Patient has no known allergies.  Family History  Problem Relation Age of Onset   Diabetes Mother    Stroke Mother    Dementia Mother    Lung cancer Father    Heart disease Father    Stroke Brother    Diabetes Brother    Throat cancer Brother    Diabetes Brother    Breast cancer Neg Hx     Social History Social History   Tobacco Use   Smoking status: Never   Smokeless tobacco: Never  Vaping Use   Vaping Use: Never used  Substance Use Topics   Alcohol use: No    Alcohol/week: 0.0 standard drinks   Drug use: No    Review of Systems  Constitutional: No fever/chills.  Positive for episodic presyncope. Eyes: No visual changes. ENT: No sore  throat. Cardiovascular: Positive for chest pain. Respiratory: Denies shortness of breath. Gastrointestinal: No abdominal pain.  No nausea, no vomiting.  No diarrhea.  No constipation. Genitourinary: Negative for dysuria. Musculoskeletal: Negative for back pain. Skin: Negative for rash. Neurological: Negative for headaches, focal weakness or numbness.  ____________________________________________   PHYSICAL EXAM:  VITAL SIGNS: Vitals:   12/26/20 1053 12/26/20 1140  BP: (!) 142/57 (!) 140/54  Pulse: (!) 51 (!) 49  Resp: (!) 22 20  Temp: 97.8 F (36.6 C)   SpO2: 100% 96%     Constitutional: Alert and oriented. Well appearing and in no acute distress. Eyes: Conjunctivae are normal. PERRL. EOMI. Head: Atraumatic. Nose: No congestion/rhinnorhea. Mouth/Throat: Mucous membranes are moist.  Oropharynx non-erythematous. Neck: No stridor. No cervical spine tenderness to palpation. Cardiovascular: Normal rate, regular rhythm. Grossly normal heart sounds.  Good peripheral circulation. Respiratory: Normal respiratory effort.  No retractions. Lungs CTAB. Gastrointestinal: Soft , nondistended, nontender to palpation. No CVA tenderness. Musculoskeletal: No lower extremity tenderness nor edema.  No joint effusions. No signs of acute trauma. Neurologic:  Normal speech and language. No gross focal neurologic deficits are appreciated.  Skin:  Skin is warm, dry and intact. No rash noted. Psychiatric: Mood and affect are normal. Speech and behavior are normal.  ____________________________________________   LABS (all labs ordered are listed, but only abnormal results are displayed)  Labs Reviewed  BASIC METABOLIC PANEL - Abnormal; Notable for the following components:      Result Value   Glucose, Bld 148 (*)    Calcium 8.8 (*)    All other components within normal limits  CBC WITH DIFFERENTIAL/PLATELET  TROPONIN I (HIGH SENSITIVITY)  TROPONIN I (HIGH SENSITIVITY)    ____________________________________________  12 Lead EKG  Sinus rhythm, rate of 53 bpm.  Normal axis.  First-degree AV block with PR interval of 239 ms.   No evidence of acute ischemia.  Similar to previous from 5 days ago. ____________________________________________  RADIOLOGY  ED MD interpretation:    Official radiology report(s): No results found.  ____________________________________________   PROCEDURES and INTERVENTIONS  Procedure(s) performed (including Critical Care):  .1-3 Lead EKG Interpretation  Date/Time: 12/26/2020 12:51 PM Performed by: Vladimir Crofts, MD Authorized by: Vladimir Crofts, MD     Interpretation: abnormal     ECG rate:  50   ECG rate assessment: normal     Rhythm: A-V block     Ectopy: none     Conduction: abnormal     Abnormal conduction: 2nd degree AV block ([place] 2)   Comments:     As noted separately, transient high-grade AV block while patient was symptomatic.  Returning to a sinus rhythm after this. .Critical Care  Date/Time: 12/26/2020 12:55 PM Performed by: Vladimir Crofts, MD Authorized by: Vladimir Crofts, MD   Critical care provider statement:    Critical care time (minutes):  45   Critical care was necessary to treat or prevent imminent or life-threatening deterioration of the following conditions:  Cardiac failure   Critical care was time spent personally by me on the following activities:  Discussions with consultants, evaluation of patient's response to treatment, examination of patient, ordering and performing treatments and interventions, ordering and review of laboratory studies, ordering and review of radiographic studies, pulse oximetry, re-evaluation of patient's condition, obtaining history from patient or surrogate and review of old charts  Medications - No data to display  ____________________________________________   MDM / ED COURSE   75 year old woman presents to the ED with episodic presyncope, likely due to  transient episodes of high-grade AV block, and requiring pacemaker placement.  Presents hemodynamically stable and asymptomatic.  While I am reevaluating the patient after daughter returns to the bedside, she becomes symptomatic with her presenting symptoms, and noted to have high-grade AV block on her telemetry.  I discussed the case with cardiology after this, who agrees to the patient for pacemaker placement.  Blood work is otherwise unremarkable.  No evidence of acute ischemia.  Will be admitted thereafter for further work-up and management.  Clinical Course as of 12/26/20 1257  Sun Dec 26, 2020  1159 I went to reevaluate the patient, daughter is at the bedside.  As I am acquiring additional history and discussing with the patient, she reports feeling this wave sensation going over her while I am in the room.  On telemetry,  I note significant amount of dropped beats with persistence of P waves without QRS, she then seems to progress to occasional QRS complexes like a second-degree AV block with 4-1 conduction, and then eventually returns to the one-to-one conduction and reports resolutions of symptoms.  Will page cardiology. [DS]  1205 I discuss case with Dr. Saralyn Pilar, he will come see patient [DS]  1231 Dr. Saralyn Pilar plans for temp pacer wire placement [DS]    Clinical Course User Index [DS] Vladimir Crofts, MD    ____________________________________________   FINAL CLINICAL IMPRESSION(S) / ED DIAGNOSES  Final diagnoses:  Near syncope  AVB (atrioventricular block)     ED Discharge Orders     None        Stefania Goulart   Note:  This document was prepared using Dragon voice recognition software and may include unintentional dictation errors.    Vladimir Crofts, MD 12/26/20 1259

## 2020-12-26 NOTE — ED Triage Notes (Addendum)
Pt BIB EMS for near syncopal episode while sitting on the couch and felt "very faint". Reports chest tightness, h/o mitral valve regurgitation. Per EMS, pt was in 2nd degree with HR in the 40s. NS 500c given, last BP 136/62 HR= 55-66. BGL=167

## 2020-12-26 NOTE — H&P (Signed)
NAME:  Erin Middleton, MRN:  [id number], DOB:  26-Jan-1946, LOS: 0 ADMISSION DATE:  12/26/2020  BRIEF SYNOPSIS acute syncope and AVB  History of Present Illness:  75 year old female referred for complete heart block.  The patient was recently hospitalized 12/21/2020 with intermittent dizziness, presyncope, and syncope.   She was sent from my Office Visit  to ER  She had an uncomplicated hospital stay without evidence for significant bradycardia or tachyarrhythmia on ECG or telemetry.  2D echocardiogram revealed normal left ventricular function, with bicuspid aortic valve.  Head CT and brain MRI were unremarkable.  T  he patient was discharged home, scheduled for outpatient stress test.   Reports she is continue to experience intermittent episodes of dizziness 3-4 times daily.    she experienced a near syncopal episode and presented to [facility] ED.  In the ED, the patient variance a 10-second episode of high-grade AV block, as well as, intermittent 4-1 AV block.  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    CODE STEMI CALLED FOR EMERGENT TVP Significant Hospital Events: Including procedures, antibiotic start and stop dates in addition to other pertinent events   Admitted to ICU emergent TVP     Objective   Blood pressure (!) 153/86, pulse 71, temperature 98.3 F (36.8 C), temperature source Oral, resp. rate 16, height 5\' 3"  (1.6 m), weight 73.4 kg, SpO2 99 %.       No intake or output data in the 24 hours ending 12/26/20 1545 Filed Weights   12/26/20 1052 12/26/20 1405  Weight: 72.6 kg 73.4 kg    Review of Systems:  Gen:  Denies  fever, sweats, chills weight loss  HEENT: Denies blurred vision, double vision, ear pain, eye pain, hearing loss, nose bleeds, sore throat Cardiac:  No dizziness, chest pain or heaviness, chest tightness,edema, No JVD Resp:   No cough, -sputum production, -shortness of breath,-wheezing, -hemoptysis,  Gi: Denies swallowing difficulty,  stomach pain, nausea or vomiting, diarrhea, constipation, bowel incontinence Gu:  Denies bladder incontinence, burning urine Ext:   Denies Joint pain, stiffness or swelling Skin: Denies  skin rash, easy bruising or bleeding or hives Endoc:  Denies polyuria, polydipsia , polyphagia or weight change Psych:   Denies depression, insomnia or hallucinations  Other:  All other systems negative  Physical Examination:   General Appearance: No distress  Neuro:without focal findings,  speech normal,  HEENT: PERRLA, EOM intact.   Pulmonary: normal breath sounds, No wheezing.  CardiovascularNormal S1,S2.  No m/r/g.   Abdomen: Benign, Soft, non-tender. Renal:  No costovertebral tenderness  GU:  Not performed at this time. Endoc: No evident thyromegaly Skin:   warm, no rashes, no ecchymosis  Extremities: normal, no cyanosis, clubbing. PSYCHIATRIC: Mood, affect within normal limits.   ALL OTHER ROS ARE NEGATIVE    Labs/imaging that I havepersonally reviewed  (right click and "Reselect all SmartList Selections" daily)      ASSESSMENT AND PLAN SYNOPSIS  Intermittent complete heart block, high-grade AV block, and   second-degree AV block with associated presyncope and syncope S/p temporary transvenous pacemaker emergently AV leadless pacemaker scheduled for 12/27/2020.      CARDIAC FAILURE-due to AVB  CARDIAC ICU monitoring     Best practice (right click and "Reselect all SmartList Selections" daily)  Diet:  Oral Pain/Anxiety/Delirium protocol (if indicated): No VAP protocol (if indicated): Not indicated DVT prophylaxis: Contraindicated GI prophylaxis: N/A Glucose control:  SSI No Central venous access:  N/A Arterial line:  N/A Foley:  N/A Mobility:  bed rest  PT consulted: N/A Code Status:  full code Disposition: ICU  Labs   CBC: Recent Labs  Lab 12/21/20 1037 12/26/20 1102  WBC 8.8 7.4  NEUTROABS  --  4.5  HGB 13.9 13.2  HCT 40.7 38.0  MCV 92.9 92.5  PLT  211 253    Basic Metabolic Panel: Recent Labs  Lab 12/21/20 1037 12/26/20 1102  NA 137 139  K 3.9 4.0  CL 104 108  CO2 24 22  GLUCOSE 155* 148*  BUN 30* 15  CREATININE 0.85 0.92  CALCIUM 9.4 8.8*   GFR: Estimated Creatinine Clearance: 49.2 mL/min (by C-G formula based on SCr of 0.92 mg/dL). Recent Labs  Lab 12/21/20 1037 12/26/20 1102  WBC 8.8 7.4   HbA1C: Hgb A1c MFr Bld  Date/Time Value Ref Range Status  12/21/2020 10:37 AM 7.0 (H) 4.8 - 5.6 % Final    Comment:    (NOTE) Pre diabetes:          5.7%-6.4%  Diabetes:              >6.4%  Glycemic control for   <7.0% adults with diabetes   08/03/2020 11:20 AM 6.6 (H) 4.8 - 5.6 % Final    Comment:             Prediabetes: 5.7 - 6.4          Diabetes: >6.4          Glycemic control for adults with diabetes: <7.0     CBG: Recent Labs  Lab 12/22/20 0105 12/22/20 0756 12/22/20 1211  GLUCAP 143* 149* 129*    Allergies No Known Allergies     DVT/GI PRX  assessed I Assessed the need for Labs I Assessed the need for Foley I Assessed the need for Central Venous Line Family Discussion when available I Assessed the need for Mobilization I made an Assessment of medications to be adjusted accordingly Safety Risk assessment completed    Corrin Parker, M.D.  [facility]  Medical Director [place] Director [facility] Cardio-Pulmonary Department

## 2020-12-26 NOTE — ED Notes (Addendum)
Pt left to Cath lab with Cath Lab RN.

## 2020-12-26 NOTE — Progress Notes (Signed)
2219- Patient back in SR rate in the 60's. Patient verbalizes relief of "heart flipping" sensation.

## 2020-12-27 ENCOUNTER — Encounter: Payer: Self-pay | Admitting: Cardiology

## 2020-12-27 ENCOUNTER — Encounter
Admission: EM | Disposition: A | Discharge status: Home / Self Care | Payer: Self-pay | Source: Home / Self Care | Attending: Internal Medicine

## 2020-12-27 HISTORY — PX: PACEMAKER LEADLESS INSERTION: EP1219

## 2020-12-27 LAB — MAGNESIUM: Magnesium: 2.1 mg/dL (ref 1.7–2.4)

## 2020-12-27 LAB — BASIC METABOLIC PANEL
Anion gap: 6 (ref 5–15)
BUN: 19 mg/dL (ref 8–23)
CO2: 23 mmol/L (ref 22–32)
Calcium: 8.5 mg/dL — ABNORMAL LOW (ref 8.9–10.3)
Chloride: 111 mmol/L (ref 98–111)
Creatinine, Ser: 0.84 mg/dL (ref 0.44–1.00)
GFR, Estimated: >60 mL/min (ref >60)
Glucose, Bld: 141 mg/dL — ABNORMAL HIGH (ref 70–99)
Potassium: 4 mmol/L (ref 3.5–5.1)
Sodium: 140 mmol/L (ref 135–145)

## 2020-12-27 LAB — PHOSPHORUS: Phosphorus: 3.7 mg/dL (ref 2.5–4.6)

## 2020-12-27 LAB — GLUCOSE, CAPILLARY: Glucose-Capillary: 111 mg/dL — ABNORMAL HIGH (ref 70–99)

## 2020-12-27 SURGERY — PACEMAKER LEADLESS INSERTION
Anesthesia: Moderate Sedation

## 2020-12-27 MED ORDER — ACETAMINOPHEN 325 MG PO TABS
650.0000 mg | ORAL_TABLET | ORAL | Status: DC | PRN
  Administered 2020-12-27: 650 mg via ORAL
  Filled 2020-12-27: qty 2

## 2020-12-27 MED ORDER — MIDAZOLAM HCL 2 MG/2ML IJ SOLN
INTRAMUSCULAR | Status: AC
  Filled 2020-12-27: qty 2

## 2020-12-27 MED ORDER — HEPARIN (PORCINE) IN NACL 1000-0.9 UT/500ML-% IV SOLN
INTRAVENOUS | Status: AC
  Filled 2020-12-27: qty 500

## 2020-12-27 MED ORDER — FENTANYL CITRATE (PF) 100 MCG/2ML IJ SOLN
INTRAMUSCULAR | Status: DC | PRN
  Administered 2020-12-27: 25 ug via INTRAVENOUS
  Administered 2020-12-27: 50 ug via INTRAVENOUS

## 2020-12-27 MED ORDER — ONDANSETRON HCL 4 MG/2ML IJ SOLN: 4.0000 mg | Freq: Four times a day (QID) | INTRAMUSCULAR | Status: DC | PRN

## 2020-12-27 MED ORDER — SODIUM CHLORIDE 0.9 % IV SOLN: 250.0000 mL | INTRAVENOUS | Status: DC | PRN

## 2020-12-27 MED ORDER — LIDOCAINE-EPINEPHRINE (PF) 1 %-1:200000 IJ SOLN
INTRAMUSCULAR | Status: DC | PRN
  Administered 2020-12-27: 20 mL

## 2020-12-27 MED ORDER — HEPARIN (PORCINE) IN NACL 2000-0.9 UNIT/L-% IV SOLN
INTRAVENOUS | Status: DC | PRN
  Administered 2020-12-27: 1000 mL

## 2020-12-27 MED ORDER — CALCIUM CARBONATE ANTACID 500 MG PO CHEW: 1.0000 | CHEWABLE_TABLET | Freq: Three times a day (TID) | ORAL | Status: DC | PRN

## 2020-12-27 MED ORDER — CHLORHEXIDINE GLUCONATE CLOTH 2 % EX PADS
6.0000 | MEDICATED_PAD | Freq: Every day | CUTANEOUS | Status: DC
  Administered 2020-12-27 – 2020-12-28 (×2): 6 via TOPICAL

## 2020-12-27 MED ORDER — HEPARIN SODIUM (PORCINE) 1000 UNIT/ML IJ SOLN
INTRAMUSCULAR | Status: AC
  Filled 2020-12-27: qty 1

## 2020-12-27 MED ORDER — SODIUM CHLORIDE 0.9% FLUSH
3.0000 mL | Freq: Two times a day (BID) | INTRAVENOUS | Status: DC
  Administered 2020-12-27 – 2020-12-28 (×2): 3 mL via INTRAVENOUS

## 2020-12-27 MED ORDER — IOHEXOL 300 MG/ML  SOLN
INTRAMUSCULAR | Status: DC | PRN
  Administered 2020-12-27: 20 mL

## 2020-12-27 MED ORDER — MIDAZOLAM HCL 2 MG/2ML IJ SOLN
INTRAMUSCULAR | Status: DC | PRN
Start: 2020-12-27 — End: 2020-12-27
  Administered 2020-12-27: 1 mg via INTRAVENOUS

## 2020-12-27 MED ORDER — LIDOCAINE HCL (PF) 1 % IJ SOLN
INTRAMUSCULAR | Status: AC
  Filled 2020-12-27: qty 30

## 2020-12-27 MED ORDER — FENTANYL CITRATE (PF) 100 MCG/2ML IJ SOLN
INTRAMUSCULAR | Status: AC
  Filled 2020-12-27: qty 2

## 2020-12-27 MED ORDER — SODIUM CHLORIDE 0.9% FLUSH: 3.0000 mL | INTRAVENOUS | Status: DC | PRN

## 2020-12-27 MED ORDER — HEPARIN SODIUM (PORCINE) 1000 UNIT/ML IJ SOLN
INTRAMUSCULAR | Status: DC | PRN
  Administered 2020-12-27: 4000 [IU] via INTRAVENOUS

## 2020-12-27 SURGICAL SUPPLY — 15 items
COVER PROBE U/S 5X48 (MISCELLANEOUS) ×2 IMPLANT
DILATOR VESSEL 38 20CM 12FR (INTRODUCER) ×2 IMPLANT
DILATOR VESSEL 38 20CM 14FR (INTRODUCER) ×2 IMPLANT
DILATOR VESSEL 38 20CM 18FR (INTRODUCER) ×2 IMPLANT
DILATOR VESSEL 38 20CM 8FR (INTRODUCER) ×2 IMPLANT
MICRA AV TRANSCATH PACING SYS (Pacemaker) ×2 IMPLANT
MICRA INTRODUCER SHEATH (SHEATH) ×2
NEEDLE PERC 18GX7CM (NEEDLE) ×2 IMPLANT
PACK CARDIAC CATH (CUSTOM PROCEDURE TRAY) ×2 IMPLANT
SHEATH AVANTI 7FRX11 (SHEATH) ×2 IMPLANT
SHEATH INTRODUCER MICRA (SHEATH) ×1 IMPLANT
SUT PROLENE 0 CT 1 30 (SUTURE) IMPLANT
SUT SILK 0 SH 30 (SUTURE) ×2 IMPLANT
SYSTEM PACING TRNSCTH AV MICRA (Pacemaker) ×1 IMPLANT
WIRE AMPLATZ SS-J .035X180CM (WIRE) ×2 IMPLANT

## 2020-12-27 NOTE — Progress Notes (Signed)
SYNOPSIS  BRIEF SYNOPSIS acute syncope and AVB   History of Present Illness:  75 year old female referred for complete heart block.  The patient was recently hospitalized 12/21/2020 with intermittent dizziness, presyncope, and syncope.   She was sent from my Office Visit  to ER   She had an uncomplicated hospital stay without evidence for significant bradycardia or tachyarrhythmia on ECG or telemetry.  2D echocardiogram revealed normal left ventricular function, with bicuspid aortic valve.  Head CT and brain MRI were unremarkable.  T   he patient was discharged home, scheduled for outpatient stress test.   Reports she is continue to experience intermittent episodes of dizziness 3-4 times daily.     she experienced a near syncopal episode and presented to [facility] ED.  In the ED, the patient variance a 10-second episode of high-grade AV block, as well as, intermittent 4-1 AV block.  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     CODE STEMI CALLED FOR EMERGENT TVP Significant Hospital Events: Including procedures, antibiotic start and stop dates in addition to other pertinent events   Admitted to ICU emergent TVP 6/27 perm pacemaker placed        CC  AV block and syncope   HPI S/p TVP S/p PACEMAKER PLACEMENT tolerated procedure well Alert and awake, follows commands       VITALS: BP (!) 145/66   Pulse 62   Temp 97.6 F (36.4 C) (Oral)   Resp (!) 9   Ht 5\' 3"  (1.6 m)   Wt 72.6 kg   SpO2 100%   BMI 28.34 kg/m     I/O last 3 completed shifts: In: -  Out: 350 [Urine:350] Total I/O In: -  Out: 125 [Urine:125]  SpO2: 100 % O2 Flow Rate (L/min): 2 L/min  Estimated body mass index is 28.34 kg/m as calculated from the following:   Height as of this encounter: 5\' 3"  (1.6 m).   Weight as of this encounter: 72.6 kg.   Review of Systems:  Gen:  Denies  fever, sweats, chills weigh loss  HEENT: Denies blurred vision, double vision, ear pain, eye pain,  hearing loss, nose bleeds, sore throat Cardiac:  No dizziness, chest pain or heaviness, chest tightness,edema, No JVD Resp:   No cough, -sputum production, -shortness of breath,-wheezing, -hemoptysis,  Other:  All other systems negative     Physical Examination:   General Appearance: No distress  Neuro:without focal findings,  speech normal,  HEENT: PERRLA, EOM intact.   Pulmonary: normal breath sounds, No wheezing.  CardiovascularNormal S1,S2.  No m/r/g.      I personally reviewed Labs under Results section.   MEDICATIONS: I have reviewed all medications and confirmed regimen as documented   CULTURE RESULTS   Recent Results (from the past 240 hour(s))  Resp Panel by RT-PCR (Flu A&B, Covid) Nasopharyngeal Swab     Status: None   Collection Time: 12/21/20 12:45 PM   Specimen: Nasopharyngeal Swab; Nasopharyngeal(NP) swabs in vial transport medium  Result Value Ref Range Status   SARS Coronavirus 2 by RT PCR NEGATIVE NEGATIVE Final    Comment: (NOTE) SARS-CoV-2 target nucleic acids are NOT DETECTED.  The SARS-CoV-2 RNA is generally detectable in upper respiratory specimens during the acute phase of infection. The lowest concentration of SARS-CoV-2 viral copies this assay can detect is 138 copies/mL. A negative result does not preclude SARS-Cov-2 infection and should not be used as the sole basis for treatment or other patient management  decisions. A negative result may occur with  improper specimen collection/handling, submission of specimen other than nasopharyngeal swab, presence of viral mutation(s) within the areas targeted by this assay, and inadequate number of viral copies(<138 copies/mL). A negative result must be combined with clinical observations, patient history, and epidemiological information. The expected result is Negative.  Fact Sheet for Patients:  EntrepreneurPulse.com.au  Fact Sheet for Healthcare Providers:   IncredibleEmployment.be  This test is no t yet approved or cleared by the [place] FDA and  has been authorized for detection and/or diagnosis of SARS-CoV-2 by FDA under an Emergency Use Authorization (EUA). This EUA will remain  in effect (meaning this test can be used) for the duration of the COVID-19 declaration under Section 564(b)(1) of the Act, 21 U.S.C.section 360bbb-3(b)(1), unless the authorization is terminated  or revoked sooner.       Influenza A by PCR NEGATIVE NEGATIVE Final   Influenza B by PCR NEGATIVE NEGATIVE Final    Comment: (NOTE) The Xpert Xpress SARS-CoV-2/FLU/RSV plus assay is intended as an aid in the diagnosis of influenza from Nasopharyngeal swab specimens and should not be used as a sole basis for treatment. Nasal washings and aspirates are unacceptable for Xpert Xpress SARS-CoV-2/FLU/RSV testing.  Fact Sheet for Patients: EntrepreneurPulse.com.au  Fact Sheet for Healthcare Providers: IncredibleEmployment.be  This test is not yet approved or cleared by the [place] FDA and has been authorized for detection and/or diagnosis of SARS-CoV-2 by FDA under an Emergency Use Authorization (EUA). This EUA will remain in effect (meaning this test can be used) for the duration of the COVID-19 declaration under Section 564(b)(1) of the Act, 21 U.S.C. section 360bbb-3(b)(1), unless the authorization is terminated or revoked.  Performed at [facility], [address] [place], [place] [address]   Surgical PCR screen     Status: None   Collection Time: 12/26/20  2:18 PM   Specimen: Nasal Mucosa; Nasal Swab  Result Value Ref Range Status   MRSA, PCR NEGATIVE NEGATIVE Final   Staphylococcus aureus NEGATIVE NEGATIVE Final    Comment: (NOTE) The Xpert SA Assay (FDA approved for NASAL specimens in patients 57 years of age and older), is one component of a comprehensive surveillance  program. It is not intended to diagnose infection nor to guide or monitor treatment. Performed at [facility], [address] [place], [place] [address]           IMAGING    EP PPM/ICD IMPLANT  Result Date: 12/27/2020 Successful implantation Micra AV leadless pacemaker        ASSESSMENT AND PLAN SYNOPSIS     Intermittent complete heart block, high-grade AV block, and   second-degree AV block with associated presyncope and syncope S/p temporary transvenous pacemaker emergently AV leadless pacemaker completed today 12/27/20 Follow up cardiology recs     CARDIAC FAILURE-due to [place] ICU monitoring  Shyteria Lewis Roux Pesa, M.D.  [facility]  Medical Director [place] Director [facility] Cardio-Pulmonary Department

## 2020-12-28 DIAGNOSIS — I4430 Unspecified atrioventricular block: Secondary | ICD-10-CM

## 2020-12-28 LAB — BASIC METABOLIC PANEL
Anion gap: 7 (ref 5–15)
BUN: 18 mg/dL (ref 8–23)
CO2: 25 mmol/L (ref 22–32)
Calcium: 8.6 mg/dL — ABNORMAL LOW (ref 8.9–10.3)
Chloride: 108 mmol/L (ref 98–111)
Creatinine, Ser: 0.72 mg/dL (ref 0.44–1.00)
GFR, Estimated: >60 mL/min (ref >60)
Glucose, Bld: 136 mg/dL — ABNORMAL HIGH (ref 70–99)
Potassium: 3.9 mmol/L (ref 3.5–5.1)
Sodium: 140 mmol/L (ref 135–145)

## 2020-12-28 NOTE — Plan of Care (Signed)
Pt cleared for discharge. VSS, no complaints verbalized. R IJ and R femoral sites dry and intact, dressings changed. Felt light headed this AM after using BSC, resolved, no other issues.

## 2020-12-28 NOTE — Progress Notes (Signed)
[facility]    SUBJECTIVE: The patient reports feeling well this morning. She does have mild tenderness to palpation of insertion site.    Vitals:   12/28/20 0600 12/28/20 0700 12/28/20 0752 12/28/20 0800  BP: (!) 143/55 132/70  117/60  Pulse: 65 66  60  Resp: 17 17  19   Temp:   98.2 F (36.8 C)   TempSrc:   Oral   SpO2: 97% 98%  98%  Weight:      Height:         Intake/Output Summary (Last 24 hours) at 12/28/2020 4235 Last data filed at 12/27/2020 2219 Gross per 24 hour  Intake 120 ml  Output 675 ml  Net -555 ml      PHYSICAL EXAM  General: Well developed, well nourished, lying in bed in no acute distress HEENT:  Normocephalic and atramatic Neck:  No JVD.  Lungs: Clear bilaterally to auscultation, normal effort of breathing on RA Heart: HRRR . 2/6 systolic murmurs.  Abdomen: nondistended Incision: right groin, suture removed, no oozing or drainage, no apparent hematoma, no bruising, no pulsatile mass, red blood on gauze Extremities: No clubbing, cyanosis or edema.   Neuro: Alert and oriented X 3. Psych:  Good affect, responds appropriately   LABS: Basic Metabolic Panel: Recent Labs    12/27/20 0413 12/28/20 0528  NA 140 140  K 4.0 3.9  CL 111 108  CO2 23 25  GLUCOSE 141* 136*  BUN 19 18  CREATININE 0.84 0.72  CALCIUM 8.5* 8.6*  MG 2.1  --   PHOS 3.7  --    Liver Function Tests: No results for input(s): AST, ALT, ALKPHOS, BILITOT, PROT, ALBUMIN in the last 72 hours. No results for input(s): LIPASE, AMYLASE in the last 72 hours. CBC: Recent Labs    12/26/20 1102  WBC 7.4  NEUTROABS 4.5  HGB 13.2  HCT 38.0  MCV 92.5  PLT 206   Cardiac Enzymes: No results for input(s): CKTOTAL, CKMB, CKMBINDEX, TROPONINI in the last 72 hours. BNP: Invalid input(s): POCBNP D-Dimer: No results for input(s): DDIMER in the last 72 hours. Hemoglobin A1C: No results for input(s): HGBA1C in the last 72 hours. Fasting Lipid Panel: No results for input(s):  CHOL, HDL, LDLCALC, TRIG, CHOLHDL, LDLDIRECT in the last 72 hours. Thyroid Function Tests: No results for input(s): TSH, T4TOTAL, T3FREE, THYROIDAB in the last 72 hours.  Invalid input(s): FREET3 Anemia Panel: No results for input(s): VITAMINB12, FOLATE, FERRITIN, TIBC, IRON, RETICCTPCT in the last 72 hours.  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 Intermittent complete heart block, high-grade AV block, and   second-degree AV block with associated presyncope and syncope, status post Micra AV leadless pacemaker implantation on 12/27/2020 without apparent perioperative complications. Currently in sinus rhythm, 60s bpm  Plan: Postoperative care discussed with the patient Follow up with Dr. Ubaldo Glassing in 1 week (appointment request made) Discharge today.   Clabe Seal, PA-C 12/28/2020 8:33 AM

## 2020-12-28 NOTE — Discharge Summary (Signed)
Physician Discharge Summary  Patient ID: Erin Middleton MRN: [id number] DOB/AGE: 09/21/1945 75 y.o.  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  The patient was recently hospitalized 12/21/2020 with intermittent dizziness, presyncope, and syncope.   She was sent from my Office Visit  to ER   She had an uncomplicated hospital stay without evidence for significant bradycardia or tachyarrhythmia on ECG or telemetry.  2D echocardiogram revealed normal left ventricular function, with bicuspid aortic valve.  Head CT and brain MRI were unremarkable.  T   he patient was discharged home, scheduled for outpatient stress test.   Reports she is continue to experience intermittent episodes of dizziness 3-4 times daily.     she experienced a near syncopal episode and presented to [facility] ED.  In the ED, the patient variance a 10-second episode of high-grade AV block, as well as, intermittent 4-1 AV block.  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     CODE STEMI CALLED FOR EMERGENT TVP Significant Hospital Events: Including procedures, antibiotic start and stop dates in addition to other pertinent events   6/26 Admitted to ICU emergent TVP 6/27 perm pacemaker placed       Consults: cardiology  Significant Diagnostic Studies/TREATMENTS EMERGENT TVP PACEMAKER PLACEMENT LEADLESS    Discharge Exam: Blood pressure (!) 110/49, pulse 78, temperature 98.2 F (36.8 C), temperature source Oral, resp. rate 18, height $RemoveBe'5\' 3"'bmTzKwYhD$  (1.6 m), weight 72.6 kg, SpO2 98 %. Physical Examination:   General Appearance: No distress  Neuro:without focal findings,  speech normal,  HEENT: PERRLA, EOM intact.   Pulmonary: normal breath sounds, No wheezing.  CardiovascularNormal S1,S2.  No m/r/g.    Abdomen: Benign, Soft, non-tender. Renal:  No costovertebral tenderness  GU:  Not performed at this time. Endoc: No evident thyromegaly Skin:   warm, no rashes, no ecchymosis  Extremities: normal, no cyanosis, clubbing. PSYCHIATRIC: Mood, affect within normal limits.   ALL OTHER ROS ARE NEGATIVE   Disposition: FOLLOW UP CARDIOLOGY IN 1 WEEK, DR PARASCHOS There are no questions and answers to display.        Allergies as of 12/28/2020   No Known Allergies      Medication List     TAKE these medications    Accu-Chek Guide test strip Generic drug: glucose blood CHECK BLOOD SUGAR EVERY DAY   Accu-Chek Softclix Lancets lancets CHECK BLOOD SUGARL ONCE DAILY   blood glucose meter kit and supplies Dispense based on patient and insurance preference. Check blood sugar once a day (FOR ICD-10 E10.9, E11.9).   cholecalciferol 25 MCG (1000 UNIT) tablet Commonly known as: VITAMIN D3 Take 1,000 Units by mouth daily.   hydrochlorothiazide 12.5 MG capsule Commonly known as: MICROZIDE Take 12.5 mg by mouth daily.   metFORMIN 500 MG tablet Commonly known as: GLUCOPHAGE Take 1 tablet (500 mg total) by mouth daily with supper.   omeprazole 20 MG capsule Commonly known as: PRILOSEC TAKE 1 CAPSULE BY MOUTH EVERY DAY IN THE MORNING What changed: See the new instructions.   PreserVision/Lutein Caps Take 1 capsule by mouth at bedtime.   rosuvastatin 10 MG tablet Commonly known as: CRESTOR Take 10 mg by mouth daily.   telmisartan 80 MG tablet Commonly known as: MICARDIS TAKE 1 TABLET BY MOUTH DAILY   vitamin E 180  MG (400 UNITS) capsule Take 400 Units by mouth daily. Reported on 09/15/2015        Follow-up Information     Fath, Javier Docker, MD. Go in 1 week(s).   Specialty: Cardiology Contact information: [place] [address] [phone]                 Signed: Flora Lipps 12/28/2020, 10:50 AM

## 2020-12-28 NOTE — Progress Notes (Signed)
Patient is alert and oriented x 4. Patient states she has no pain but does have some soreness at the right femoral site. Patient states that she does not need anything for pain at this time. Patient has remained afebrile this shift. Inspection of patients right femoral site there is dry blood to the right of the transparent dressing but no new bleeding or oozing has been observed. Will continue patient care.

## 2021-01-04 DIAGNOSIS — I10 Essential (primary) hypertension: Secondary | ICD-10-CM | POA: Diagnosis not present

## 2021-01-04 DIAGNOSIS — R002 Palpitations: Secondary | ICD-10-CM | POA: Diagnosis not present

## 2021-01-04 DIAGNOSIS — I272 Pulmonary hypertension, unspecified: Secondary | ICD-10-CM | POA: Diagnosis not present

## 2021-01-04 DIAGNOSIS — R011 Cardiac murmur, unspecified: Secondary | ICD-10-CM | POA: Diagnosis not present

## 2021-01-04 DIAGNOSIS — I442 Atrioventricular block, complete: Secondary | ICD-10-CM | POA: Diagnosis not present

## 2021-01-04 DIAGNOSIS — Q231 Congenital insufficiency of aortic valve: Secondary | ICD-10-CM | POA: Diagnosis not present

## 2021-01-17 ENCOUNTER — Other Ambulatory Visit: Payer: Self-pay

## 2021-01-17 ENCOUNTER — Ambulatory Visit: Payer: Medicare PPO | Admitting: Family Medicine

## 2021-01-17 ENCOUNTER — Encounter: Payer: Self-pay | Admitting: Family Medicine

## 2021-01-17 VITALS — BP 125/59 | HR 68 | Resp 16 | Wt 164.0 lb

## 2021-01-17 DIAGNOSIS — I10 Essential (primary) hypertension: Secondary | ICD-10-CM | POA: Diagnosis not present

## 2021-01-17 DIAGNOSIS — Z9989 Dependence on other enabling machines and devices: Secondary | ICD-10-CM

## 2021-01-17 DIAGNOSIS — R079 Chest pain, unspecified: Secondary | ICD-10-CM | POA: Diagnosis not present

## 2021-01-17 DIAGNOSIS — R55 Syncope and collapse: Secondary | ICD-10-CM | POA: Diagnosis not present

## 2021-01-17 DIAGNOSIS — G4733 Obstructive sleep apnea (adult) (pediatric): Secondary | ICD-10-CM | POA: Diagnosis not present

## 2021-01-17 DIAGNOSIS — R0602 Shortness of breath: Secondary | ICD-10-CM

## 2021-01-17 DIAGNOSIS — Z950 Presence of cardiac pacemaker: Secondary | ICD-10-CM

## 2021-01-17 NOTE — Progress Notes (Signed)
Established patient visit   Patient: Erin Middleton   DOB: 1946-04-04   75 y.o. Female  MRN: [id number] Visit Date: 01/17/2021  Today's healthcare provider: Wilhemena Durie, MD   Chief Complaint  Patient presents with   Hospitalization Follow-up   Subjective    HPI  Follow up Hospitalization  Patient was admitted to [facility] on 12/26/2020 and discharged on 12/28/2020. She was treated for Near Syncope, AV block, 3rd degree. Treatment for this included; see notes in chart. Telephone follow up was done on none She reports fair compliance with treatment. Patient reports that she feels fairly well today and states at times she does feel light headed/dizzy.  She reports this condition is stayed the same. Patient reports tightness in chest, anxiety and dyspnea.  She had a leadless pacemaker inserted and has tolerated that procedure well. She is still frustrated that she is still feeling lightheaded and dyspneic. No chest pain. 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   Hypertension 01/18/2015   Breast CA ([place]) 11/04/2014   Acid reflux 11/04/2014   Blood pressure elevated 11/04/2014   Plantar fasciitis 11/04/2014   H/O ear disorder 05/19/2014   Breast cancer, female ([place]) 05/19/2014   Dupuytren's contracture of foot 05/19/2014   Avitaminosis D 09/21/2008   Arthropathia 10/08/2006   History of colon polyps 07/19/2006   Past Medical History:  Diagnosis Date   Arthritis    Breast cancer ([place]) 1985   left breast cancer - chemotherapy    Cancer ([place])    Breast   GERD (gastroesophageal reflux disease)    Headache    migraines h/o   Hypertension    Personal history of chemotherapy    No Known Allergies     Medications: Outpatient Medications Prior to Visit  Medication Sig   ACCU-CHEK GUIDE test strip CHECK BLOOD SUGAR EVERY DAY   Accu-Chek Softclix Lancets lancets CHECK BLOOD SUGARL ONCE DAILY   amLODipine (NORVASC) 5 MG tablet Take 1 tablet by mouth at bedtime.   blood glucose meter kit and supplies Dispense based on patient and insurance preference. Check blood sugar once a day (FOR ICD-10 E10.9, E11.9).   cholecalciferol (VITAMIN D3) 25 MCG (1000 UNIT) tablet Take 1,000 Units by mouth daily.   hydrochlorothiazide (MICROZIDE) 12.5 MG capsule Take 12.5 mg by mouth daily.   metFORMIN (GLUCOPHAGE) 500 MG tablet Take 1 tablet (500 mg total) by mouth daily with supper.   Multiple Vitamins-Minerals (PRESERVISION/LUTEIN) CAPS Take 1 capsule by mouth at bedtime.   omeprazole (PRILOSEC) 20 MG capsule TAKE 1 CAPSULE BY MOUTH EVERY DAY IN THE MORNING   rosuvastatin (CRESTOR) 10 MG tablet Take 10 mg by mouth daily.   telmisartan (MICARDIS) 80 MG tablet TAKE 1 TABLET BY MOUTH DAILY   vitamin E 400 UNIT capsule Take 400 Units by mouth daily. Reported on 09/15/2015   No facility-administered medications prior to visit.    Review of Systems  Constitutional:  Negative for appetite change, chills, fatigue and fever.  Respiratory:  Negative for chest tightness and shortness of breath.   Cardiovascular:  Negative for chest pain and  palpitations.  Gastrointestinal:  Negative for abdominal pain, nausea and vomiting.  Neurological:  Negative for dizziness and weakness.       Objective    BP (!) 125/59   Pulse 68   Resp 16   Wt 164 lb (74.4 kg)   BMI 29.05 kg/m  BP Readings from Last 3 Encounters:  01/17/21 (!) 125/59  12/28/20 108/69  12/22/20 (!) 126/56   Wt Readings from Last 3 Encounters:  01/17/21 164 lb (74.4 kg)   12/27/20 160 lb (72.6 kg)  12/21/20 160 lb 12.8 oz (72.9 kg)       Physical Exam Vitals reviewed. Exam conducted with a chaperone present.  Constitutional:      Appearance: Normal appearance. She is normal weight.  HENT:     Head: Normocephalic and atraumatic.     Right Ear: Tympanic membrane normal.     Left Ear: Tympanic membrane normal.     Nose: Nose normal.     Mouth/Throat:     Mouth: Mucous membranes are moist.     Pharynx: Oropharynx is clear.  Eyes:     Extraocular Movements: Extraocular movements intact.     Conjunctiva/sclera: Conjunctivae normal.     Pupils: Pupils are equal, round, and reactive to light.  Cardiovascular:     Rate and Rhythm: Normal rate and regular rhythm.     Pulses: Normal pulses.     Heart sounds: Murmur heard.     Comments: 2/6 murmur at LUSB. Pulmonary:     Effort: Pulmonary effort is normal.     Breath sounds: Normal breath sounds.  Chest:  Breasts:    Right: No mass.  Abdominal:     General: Abdomen is flat. Bowel sounds are normal.     Palpations: Abdomen is soft.  Musculoskeletal:     Cervical back: Normal range of motion and neck supple.  Skin:    General: Skin is warm and dry.  Neurological:     General: No focal deficit present.     Mental Status: She is alert and oriented to person, place, and time. Mental status is at baseline.     Motor: No weakness.     Gait: Gait normal.  Psychiatric:        Mood and Affect: Mood normal.        Behavior: Behavior normal.        Thought Content: Thought content normal.        Judgment: Judgment normal.    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  No ischemic changes.   No results found for any visits on 01/17/21.  Assessment & Plan     1. Shortness of breath No evidence of DVT or PE.  I do not think this is heart failure.  I think she is deconditioned and dealing with recent cardiac event  with pacemaker placement.  I have encouraged her to slowly increase her level of activity - EKG 12-Lead - CBC w/Diff/Platelet - Comprehensive Metabolic Panel (CMET) - TSH  2. Chest pain, unspecified type Chest tightness with dyspnea.  Again, no evidence of ischemia.  Has follow-up with cardiology in the near future - CBC w/Diff/Platelet - Comprehensive Metabolic Panel (CMET) - TSH  3. Essential hypertension Good control. - CBC w/Diff/Platelet - Comprehensive Metabolic Panel (CMET) - TSH  4. OSA on CPAP  - CBC w/Diff/Platelet - Comprehensive Metabolic Panel (CMET) - TSH  5.  Pre-syncope Advised patient to make sure she stays hydrated. - CBC w/Diff/Platelet - Comprehensive Metabolic Panel (CMET) - TSH  6. Pacemaker Leadless pacemaker in place - CBC w/Diff/Platelet - Comprehensive Metabolic Panel (CMET) - TSH   Return in about 4 months (around 05/20/2021).      I, Wilhemena Durie, MD, have reviewed all documentation for this visit. The documentation on 01/22/21 for the exam, diagnosis, procedures, and orders are all accurate and complete.    Richard Cranford Mon, MD  [facility] [phone] (phone) [phone] (fax)  [place]

## 2021-01-18 LAB — TSH: TSH: 1.98 u[IU]/mL (ref 0.450–4.500)

## 2021-01-18 LAB — CBC WITH DIFFERENTIAL/PLATELET
Basophils Absolute: 0 10*3/uL (ref 0.0–0.2)
Basos: 0 %
EOS (ABSOLUTE): 0.4 10*3/uL (ref 0.0–0.4)
Eos: 4 %
Hematocrit: 40.4 % (ref 34.0–46.6)
Hemoglobin: 13.4 g/dL (ref 11.1–15.9)
Immature Grans (Abs): 0 10*3/uL (ref 0.0–0.1)
Immature Granulocytes: 0 %
Lymphocytes Absolute: 2.5 10*3/uL (ref 0.7–3.1)
Lymphs: 28 %
MCH: 31.6 pg (ref 26.6–33.0)
MCHC: 33.2 g/dL (ref 31.5–35.7)
MCV: 95 fL (ref 79–97)
Monocytes Absolute: 0.6 10*3/uL (ref 0.1–0.9)
Monocytes: 6 %
Neutrophils Absolute: 5.4 10*3/uL (ref 1.4–7.0)
Neutrophils: 62 %
Platelets: 231 10*3/uL (ref 150–450)
RBC: 4.24 x10E6/uL (ref 3.77–5.28)
RDW: 12.8 % (ref 11.7–15.4)
WBC: 8.9 10*3/uL (ref 3.4–10.8)

## 2021-01-18 LAB — COMPREHENSIVE METABOLIC PANEL
ALT: 17 IU/L (ref 0–32)
AST: 15 IU/L (ref 0–40)
Albumin/Globulin Ratio: 1.9 (ref 1.2–2.2)
Albumin: 4.6 g/dL (ref 3.7–4.7)
Alkaline Phosphatase: 92 IU/L (ref 44–121)
BUN/Creatinine Ratio: 21 (ref 12–28)
BUN: 22 mg/dL (ref 8–27)
Bilirubin Total: 0.6 mg/dL (ref 0.0–1.2)
CO2: 23 mmol/L (ref 20–29)
Calcium: 9.5 mg/dL (ref 8.7–10.3)
Chloride: 103 mmol/L (ref 96–106)
Creatinine, Ser: 1.04 mg/dL — ABNORMAL HIGH (ref 0.57–1.00)
Globulin, Total: 2.4 g/dL (ref 1.5–4.5)
Glucose: 119 mg/dL — ABNORMAL HIGH (ref 65–99)
Potassium: 4.1 mmol/L (ref 3.5–5.2)
Sodium: 141 mmol/L (ref 134–144)
Total Protein: 7 g/dL (ref 6.0–8.5)
eGFR: 55 mL/min/{1.73_m2} — ABNORMAL LOW (ref >59)

## 2021-01-27 ENCOUNTER — Encounter: Payer: Self-pay | Admitting: Family Medicine

## 2021-01-27 NOTE — Progress Notes (Deleted)
Annual Wellness Visit     Patient: Erin Middleton, Female    DOB: [birth year], 75 y.o.   MRN: [id number] Visit Date: 01/27/2021  Today's Provider: Wilhemena Durie, MD   No chief complaint on file.  Subjective    Erin Middleton is a 75 y.o. female who presents today for her Annual Wellness Visit. She reports consuming a {diet types:17450} diet. {Exercise:19826} She generally feels {well/fairly well/poorly:18703}. She reports sleeping {well/fairly well/poorly:18703}. She {does/does not:200015} have additional problems to discuss today.   HPI   {Show patient history (optional):23778}   Medications: Outpatient Medications Prior to Visit  Medication Sig   ACCU-CHEK GUIDE test strip CHECK BLOOD SUGAR EVERY DAY   Accu-Chek Softclix Lancets lancets CHECK BLOOD SUGARL ONCE DAILY   amLODipine (NORVASC) 5 MG tablet Take 1 tablet by mouth at bedtime.   blood glucose meter kit and supplies Dispense based on patient and insurance preference. Check blood sugar once a day (FOR ICD-10 E10.9, E11.9).   cholecalciferol (VITAMIN D3) 25 MCG (1000 UNIT) tablet Take 1,000 Units by mouth daily.   hydrochlorothiazide (MICROZIDE) 12.5 MG capsule Take 12.5 mg by mouth daily.   metFORMIN (GLUCOPHAGE) 500 MG tablet Take 1 tablet (500 mg total) by mouth daily with supper.   Multiple Vitamins-Minerals (PRESERVISION/LUTEIN) CAPS Take 1 capsule by mouth at bedtime.   omeprazole (PRILOSEC) 20 MG capsule TAKE 1 CAPSULE BY MOUTH EVERY DAY IN THE MORNING   rosuvastatin (CRESTOR) 10 MG tablet Take 10 mg by mouth daily.   telmisartan (MICARDIS) 80 MG tablet TAKE 1 TABLET BY MOUTH DAILY   vitamin E 400 UNIT capsule Take 400 Units by mouth daily. Reported on 09/15/2015   No facility-administered medications prior to visit.    No Known Allergies  Patient Care Team: Jerrol Banana., MD as PCP - General (Family Medicine) Earnestine Leys, MD as Consulting Physician (Specialist) Ubaldo Glassing Javier Docker, MD as  Consulting Physician (Cardiology) Rhea Bleacher as Physician Assistant (Gastroenterology) Magdalen Spatz, NP as Nurse Practitioner (Pulmonary Disease)  Review of Systems  All other systems reviewed and are negative.  {Labs  Heme  Chem  Endocrine  Serology  Results Review (optional):23779}    Objective    Vitals: There were no vitals taken for this visit. {Show previous vital signs (optional):23777}  Physical Exam ***  Most recent functional status assessment: In your present state of health, do you have any difficulty performing the following activities: 12/26/2020  Hearing? N  Vision? N  Difficulty concentrating or making decisions? N  Walking or climbing stairs? N  Dressing or bathing? N  Doing errands, shopping? N  Some recent data might be hidden   Most recent fall risk assessment: Fall Risk  08/03/2020  Falls in the past year? 0  Number falls in past yr: 0  Injury with Fall? 0  Risk for fall due to : No Fall Risks  Follow up Falls evaluation completed    Most recent depression screenings: PHQ 2/9 Scores 08/03/2020 07/14/2019  PHQ - 2 Score 2 0  PHQ- 9 Score 6 -   Most recent cognitive screening: 6CIT Screen 05/10/2016  What Year? 0 points  What month? 0 points  What time? 0 points  Count back from 20 0 points  Months in reverse 0 points  Repeat phrase 4 points  Total Score 4   Most recent Audit-C alcohol use screening Alcohol Use Disorder Test (AUDIT) 08/03/2020  1. How often do you have  a drink containing alcohol? 0  2. How many drinks containing alcohol do you have on a typical day when you are drinking? 0  3. How often do you have six or more drinks on one occasion? 0  AUDIT-C Score 0  Alcohol Brief Interventions/Follow-up AUDIT Score <7 follow-up not indicated   A score of 3 or more in women, and 4 or more in men indicates increased risk for alcohol abuse, EXCEPT if all of the points are from question 1   No results found for any visits on  01/27/21.  Assessment & Plan     Annual wellness visit done today including the all of the following: Reviewed patient's Family Medical History Reviewed and updated list of patient's medical providers Assessment of cognitive impairment was done Assessed patient's functional ability Established a written schedule for health screening services Health Risk Assessent Completed and Reviewed  Exercise Activities and Dietary recommendations  Goals      Exercise 3x per week (30 min per time)     Recommend to start walking at least 3 times a week for 30 minutes at a time.         Immunization History  Administered Date(s) Administered   Fluad Quad(high Dose 65+) 04/15/2019, 05/19/2020   Influenza, High Dose Seasonal PF 05/10/2016, 06/06/2017, 03/07/2018   Influenza-Unspecified 04/09/2015   PFIZER(Purple Top)SARS-COV-2 Vaccination 07/26/2019, 08/18/2019, 06/01/2020   Pneumococcal Conjugate-13 05/10/2016   Pneumococcal Polysaccharide-23 03/06/2012   Td 01/23/2003    Health Maintenance  Topic Date Due   FOOT EXAM  Never done   OPHTHALMOLOGY EXAM  Never done   Zoster Vaccines- Shingrix (1 of 2) Never done   TETANUS/TDAP  01/22/2013   COVID-19 Vaccine (4 - Booster for Pfizer series) 08/30/2020   INFLUENZA VACCINE  01/31/2021   DEXA SCAN  03/14/2021   HEMOGLOBIN A1C  06/22/2021   COLONOSCOPY (Pts 45-59yrs Insurance coverage will need to be confirmed)  11/09/2025   Hepatitis C Screening  Completed   PNA vac Low Risk Adult  Completed   HPV VACCINES  Aged Out     Discussed health benefits of physical activity, and encouraged her to engage in regular exercise appropriate for her age and condition.    ***  No follow-ups on file.     {provider attestation***:1}   Wilhemena Durie, MD  [facility] [phone] (phone) [phone] (fax)  [place]

## 2021-02-03 DIAGNOSIS — R079 Chest pain, unspecified: Secondary | ICD-10-CM | POA: Diagnosis not present

## 2021-02-09 DIAGNOSIS — I442 Atrioventricular block, complete: Secondary | ICD-10-CM | POA: Diagnosis not present

## 2021-02-09 DIAGNOSIS — Q231 Congenital insufficiency of aortic valve: Secondary | ICD-10-CM | POA: Diagnosis not present

## 2021-02-09 DIAGNOSIS — R011 Cardiac murmur, unspecified: Secondary | ICD-10-CM | POA: Diagnosis not present

## 2021-02-15 ENCOUNTER — Other Ambulatory Visit: Payer: Self-pay | Admitting: Family Medicine

## 2021-02-15 DIAGNOSIS — E119 Type 2 diabetes mellitus without complications: Secondary | ICD-10-CM

## 2021-02-15 DIAGNOSIS — I10 Essential (primary) hypertension: Secondary | ICD-10-CM

## 2021-02-15 NOTE — Telephone Encounter (Signed)
Notes to clinic:  medication filled by a historical provider  Review for refill    Requested Prescriptions  Pending Prescriptions Disp Refills   amLODipine (NORVASC) 5 MG tablet [Pharmacy Med Name: AMLODIPINE BESYLATE 5 MG TAB] 90 tablet 2    Sig: TAKE 1 TABLET BY MOUTH EVERY DAY IN THE EVENING     Cardiovascular:  Calcium Channel Blockers Passed - 02/15/2021  9:04 AM      Passed - Last BP in normal range    BP Readings from Last 1 Encounters:  01/17/21 (!) 125/59          Passed - Valid encounter within last 6 months    Recent Outpatient Visits           4 weeks ago Shortness of breath   [facility] Maple Hudson., MD   6 months ago Encounter for [employer] annual wellness exam   [facility] Maple Hudson., MD   11 months ago Essential hypertension   [facility] Maple Hudson., MD   1 year ago Essential hypertension   [facility] Maple Hudson., MD   1 year ago Annual physical exam   [facility] Maple Hudson., MD              Signed Prescriptions Disp Refills   telmisartan (MICARDIS) 80 MG tablet 90 tablet 0    Sig: TAKE 1 TABLET BY MOUTH EVERY DAY     Cardiovascular:  Angiotensin Receptor Blockers Failed - 02/15/2021  9:04 AM      Failed - Cr in normal range and within 180 days    Creat  Date Value Ref Range Status  06/06/2017 0.94 (H) 0.60 - 0.93 mg/dL Final    Comment:    For patients >70 years of age, the reference limit for Creatinine is approximately 13% higher for people identified as African-American. .    Creatinine, Ser  Date Value Ref Range Status  01/17/2021 1.04 (H) 0.57 - 1.00 mg/dL Final          Passed - K in normal range and within 180 days    Potassium  Date Value Ref Range Status  01/17/2021 4.1 3.5 - 5.2 mmol/L Final          Passed - Patient is not pregnant      Passed - Last BP in normal range    BP Readings  from Last 1 Encounters:  01/17/21 (!) 125/59          Passed - Valid encounter within last 6 months    Recent Outpatient Visits           4 weeks ago Shortness of breath   [facility] Maple Hudson., MD   6 months ago Encounter for [employer] annual wellness exam   [facility]., MD   11 months ago Essential hypertension   [facility] Maple Hudson., MD   1 year ago Essential hypertension   [facility] Maple Hudson., MD   1 year ago Annual physical exam   [facility] Maple Hudson., MD               ACCU-CHEK GUIDE test strip 100 strip 2    Sig: CHECK BLOOD SUGAR EVERY DAY     Endocrinology: Diabetes - Testing Supplies Passed - 02/15/2021  9:04 AM  Passed - Valid encounter within last 12 months    Recent Outpatient Visits           4 weeks ago Shortness of breath   [facility] Jerrol Banana., MD   6 months ago Encounter for [employer] annual wellness exam   [facility] Jer[facility] Banana., MD   11 months ago Essential hypertension   [facility] Jerrol Banana., MD   1 year ago Essential hypertension   [facility] Jerrol Banana., MD   1 year ago Annual physical exam   [facility] Jerrol Banana., MD               metFORMIN (GLUCOPHAGE) 500 MG tablet 90 tablet 0    Sig: TAKE 1 TABLET (500 MG TOTAL) BY MOUTH DAILY WITH SUPPER.     Endocrinology:  Diabetes - Biguanides Failed - 02/15/2021  9:04 AM      Failed - Cr in normal range and within 360 days    Creat  Date Value Ref Range Status  06/06/2017 0.94 (H) 0.60 - 0.93 mg/dL Final    Comment:    For patients >11 years of age, the reference limit for Creatinine is approximately 13% higher for people identified as African-American. .    Creatinine, Ser  Date Value Ref Range  Status  01/17/2021 1.04 (H) 0.57 - 1.00 mg/dL Final          Failed - eGFR in normal range and within 360 days    GFR, Est African American  Date Value Ref Range Status  06/06/2017 70 > OR = 60 mL/min/1.55m2 Final   GFR calc Af Amer  Date Value Ref Range Status  08/03/2020 66 >59 mL/min/1.73 Final    Comment:    **In accordance with recommendations from the NKF-ASN Task force,**   [facility] is in the process of updating its eGFR calculation to the   2021 CKD-EPI creatinine equation that estimates kidney function   without a race variable.    GFR, Est Non African American  Date Value Ref Range Status  06/06/2017 60 > OR = 60 mL/min/1.35m2 Final   GFR, Estimated  Date Value Ref Range Status  12/28/2020 >60 >60 mL/min Final    Comment:    (NOTE) Calculated using the CKD-EPI Creatinine Equation (2021)    eGFR  Date Value Ref Range Status  01/17/2021 55 (L) >59 mL/min/1.73 Final          Passed - HBA1C is between 0 and 7.9 and within 180 days    Hgb A1c MFr Bld  Date Value Ref Range Status  12/21/2020 7.0 (H) 4.8 - 5.6 % Final    Comment:    (NOTE) Pre diabetes:          5.7%-6.4%  Diabetes:              >6.4%  Glycemic control for   <7.0% adults with diabetes           Passed - Valid encounter within last 6 months    Recent Outpatient Visits           4 weeks ago Shortness of breath   [facility] Jerrol Banana., MD   6 months ago Encounter for [employer] annual wellness exam   [facility] Jerrol Banana., MD   11 months ago Essential hypertension   [facility]  [place]   1 year ago Essential hypertension   [facility] Jerrol Banana., MD   1 year ago Annual physical exam   [facility] Jerrol Banana., MD

## 2021-03-03 DIAGNOSIS — R0602 Shortness of breath: Secondary | ICD-10-CM | POA: Insufficient documentation

## 2021-03-07 ENCOUNTER — Other Ambulatory Visit: Payer: Self-pay | Admitting: Family Medicine

## 2021-03-07 DIAGNOSIS — E785 Hyperlipidemia, unspecified: Secondary | ICD-10-CM

## 2021-04-02 ENCOUNTER — Other Ambulatory Visit: Payer: Self-pay | Admitting: Family Medicine

## 2021-04-02 NOTE — Telephone Encounter (Signed)
Requested medication (s) are due for refill today: yes  Requested medication (s) are on the active medication list: hx med  Last refill:  12/27/20  Future visit scheduled: yes  Notes to clinic:  historical provider   Requested Prescriptions  Pending Prescriptions Disp Refills   hydrochlorothiazide (MICROZIDE) 12.5 MG capsule [Pharmacy Med Name: HYDROCHLOROTHIAZIDE 12.5 MG CP] 90 capsule 1    Sig: TAKE 1 CAPSULE BY MOUTH EVERY DAY     Cardiovascular: Diuretics - Thiazide Failed - 04/02/2021 10:58 AM      Failed - Cr in normal range and within 360 days    Creat  Date Value Ref Range Status  06/06/2017 0.94 (H) 0.60 - 0.93 mg/dL Final    Comment:    For patients >27 years of age, the reference limit for Creatinine is approximately 13% higher for people identified as African-American. .    Creatinine, Ser  Date Value Ref Range Status  01/17/2021 1.04 (H) 0.57 - 1.00 mg/dL Final          Passed - Ca in normal range and within 360 days    Calcium  Date Value Ref Range Status  01/17/2021 9.5 8.7 - 10.3 mg/dL Final   Calcium, Ion  Date Value Ref Range Status  02/10/2016 5.5 4.5 - 5.6 mg/dL Final          Passed - K in normal range and within 360 days    Potassium  Date Value Ref Range Status  01/17/2021 4.1 3.5 - 5.2 mmol/L Final          Passed - Na in normal range and within 360 days    Sodium  Date Value Ref Range Status  01/17/2021 141 134 - 144 mmol/L Final          Passed - Last BP in normal range    BP Readings from Last 1 Encounters:  01/17/21 (!) 125/59          Passed - Valid encounter within last 6 months    Recent Outpatient Visits           2 months ago Shortness of breath   [facility] Jerrol Banana., MD   8 months ago Encounter for [employer] annual wellness exam   [facility] Jerrol Banana., MD   1 year ago Essential hypertension   [facility] Jerrol Banana., MD   1  year ago Essential hypertension   [facility] Jerrol Banana., MD   1 year ago Annual physical exam   [facility] Jerrol Banana., MD

## 2021-04-26 ENCOUNTER — Other Ambulatory Visit: Payer: Self-pay

## 2021-04-26 ENCOUNTER — Ambulatory Visit: Payer: Medicare PPO | Admitting: Internal Medicine

## 2021-04-26 ENCOUNTER — Ambulatory Visit
Admission: RE | Admit: 2021-04-26 | Discharge: 2021-04-26 | Disposition: A | Discharge status: Home / Self Care | Payer: Medicare PPO | Source: Ambulatory Visit | Attending: Internal Medicine | Admitting: Internal Medicine

## 2021-04-26 ENCOUNTER — Encounter: Payer: Self-pay | Admitting: Internal Medicine

## 2021-04-26 VITALS — BP 124/70 | HR 63 | Temp 97.3°F | Ht 63.0 in | Wt 163.6 lb

## 2021-04-26 DIAGNOSIS — R0602 Shortness of breath: Secondary | ICD-10-CM

## 2021-04-26 DIAGNOSIS — G4733 Obstructive sleep apnea (adult) (pediatric): Secondary | ICD-10-CM

## 2021-04-26 NOTE — Patient Instructions (Addendum)
Excellent with CPAP therapy A!!  Follow-up cardiology appointment May want to consider cardiopulmonary rehab  CXR and 6MWT needed

## 2021-04-26 NOTE — Progress Notes (Signed)
@Patient  ID: Erin Middleton, female    DOB: [birth year], 75 y.o.   MRN: [id number]   TESTS/DOWNLOADS Patient's home sleep study which was done in May/2020 showed moderate obstructive sleep apnea with an AHI of 22.6, SaO2 low 66%  10/2020 Compliance report 97% for days 9% for greater than 4 hours AHI reduced to 1.2 previous AHI of 22  CPAP DL 04/2021 Excellent compliance report 90% days and 83%>4 hrs AHI 1.2    Chief Complaint  Patient presents with   Follow-up    Pt states here for continue SOB   Follow up OSA   HPI:  75 year old female never smoker followed in our office for obstructive sleep apnea Patient diagnosed with severe sleep apnea Serial echocardiogram showed EF 55% with right ventricular systolic pressure 50 Patient sees cardiology and was supposed to get assessment for stress test next week Was admitted for severe syncopal episode ended with permanent pacemaker  NO evidence or signs of infection at this time Mild  respiratory distress No fevers, chills, nausea, vomiting, diarrhea No evidence of lower extremity edema No evidence hemoptysis  COMPLIANCE REPORT REVIEWED IN DETAIL WITH PATIENT   CBC    Component Value Date/Time   WBC 8.9 01/17/2021 1603   WBC 7.4 12/26/2020 1102   RBC 4.24 01/17/2021 1603   RBC 4.11 12/26/2020 1102   HGB 13.4 01/17/2021 1603   HCT 40.4 01/17/2021 1603   PLT 231 01/17/2021 1603   MCV 95 01/17/2021 1603   MCH 31.6 01/17/2021 1603   MCH 32.1 12/26/2020 1102   MCHC 33.2 01/17/2021 1603   MCHC 34.7 12/26/2020 1102   RDW 12.8 01/17/2021 1603   LYMPHSABS 2.5 01/17/2021 1603   MONOABS 0.4 12/26/2020 1102   EOSABS 0.4 01/17/2021 1603   BASOSABS 0.0 01/17/2021 1603    BMET    Component Value Date/Time   NA 141 01/17/2021 1603   K 4.1 01/17/2021 1603   CL 103 01/17/2021 1603   CO2 23 01/17/2021 1603   GLUCOSE 119 (H) 01/17/2021 1603   GLUCOSE 136 (H) 12/28/2020 0528   BUN 22 01/17/2021 1603   CREATININE 1.04 (H)  01/17/2021 1603   CREATININE 0.94 (H) 06/06/2017 1120   CALCIUM 9.5 01/17/2021 1603   GFRNONAA >60 12/28/2020 0528   GFRNONAA 60 06/06/2017 1120   GFRAA 66 08/03/2020 1120   GFRAA 70 06/06/2017 1120    BNP    Component Value Date/Time   BNP 81.0 09/19/2018 1120      No Known Allergies  Immunization History  Administered Date(s) Administered   Fluad Quad(high Dose 65+) 04/15/2019, 05/19/2020   Influenza, High Dose Seasonal PF 05/10/2016, 06/06/2017, 03/07/2018   Influenza-Unspecified 04/09/2015   PFIZER(Purple Top)SARS-COV-2 Vaccination 07/26/2019, 08/18/2019, 06/01/2020   Pneumococcal Conjugate-13 05/10/2016   Pneumococcal Polysaccharide-23 03/06/2012   Td 01/23/2003    Past Medical History:  Diagnosis Date   Arthritis    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 Negative for activity change and fever.  HENT:  Negative for sinus pressure, sinus pain and sore throat.   Respiratory:  Positive for shortness of breath. Negative for cough and wheezing.   Cardiovascular:  Negative for chest pain and  palpitations.  Gastrointestinal:  Negative for diarrhea, nausea and vomiting.  Musculoskeletal:  Negative for arthralgias.  Neurological:  Positive for syncope. Negative for dizziness.  Psychiatric/Behavioral:  Negative for sleep disturbance. The patient is not nervous/anxious.     BP 124/70 (BP Location: Right Arm, Patient Position: Sitting, Cuff Size: Normal)   Pulse 63   Temp (!) 97.3 F (36.3 C)   Ht 5\' 3"  (1.6 m)   Wt 163 lb 9.6 oz (74.2 kg)   SpO2 99%   BMI 28.98 kg/m    Physical Examination:   General Appearance: No distress  Neuro:without focal findings,  speech normal,  HEENT: PERRLA, EOM intact.    Pulmonary: normal breath sounds, No wheezing.  CardiovascularNormal S1,S2.  No m/r/g.   ALL OTHER ROS ARE NEGATIVE     Assessment & Plan:   75 year old pleasant white female seen today for follow-up assessment for OSA Compliance report reviewed with patient patient has excellent compliance report patient uses benefits from CPAP therapy auto CPAP 5 to 20 cm of water pressure AHI is reduced to 1.2 previous AHI was 22 Has some increased SOB and WOB today   PAH (pulmonary artery hypertension) ([place]) Previously seen on prior echocardiograms Most likely related to sleep apnea Continue therapy as prescribed  Follow up Cardiology  SOB and DOE Check 6MWT and CXR    Patient/Family are satisfied with Plan of action and management. All questions answered  Follow up 6 months  Total Time spent 25 mins   [facility] Jolleen Pesa, M.D.  [facility]  Medical Director [place] Director [facility] Cardio-Pulmonary Department

## 2021-05-04 ENCOUNTER — Ambulatory Visit (INDEPENDENT_AMBULATORY_CARE_PROVIDER_SITE_OTHER): Payer: Medicare PPO

## 2021-05-04 ENCOUNTER — Other Ambulatory Visit: Payer: Self-pay

## 2021-05-04 DIAGNOSIS — Z23 Encounter for immunization: Secondary | ICD-10-CM

## 2021-05-10 ENCOUNTER — Telehealth: Payer: Self-pay | Admitting: Internal Medicine

## 2021-05-10 ENCOUNTER — Other Ambulatory Visit: Payer: Self-pay

## 2021-05-10 ENCOUNTER — Ambulatory Visit (INDEPENDENT_AMBULATORY_CARE_PROVIDER_SITE_OTHER): Payer: Medicare PPO

## 2021-05-10 DIAGNOSIS — R0609 Other forms of dyspnea: Secondary | ICD-10-CM

## 2021-05-10 NOTE — Telephone Encounter (Signed)
Patient in office for six minute walk.  Patient walked at slow pace with complaints of moderate-severe sob.  Test paused 3 times due to sob. 4:25 97% HR 56, 3:00 97% HR 89 and 1:38 98% HR 60. HR tends to drop during SOB.  Patient requested that Dr. Mortimer Fries speak with Dr. Ubaldo Glassing.  Dr. Colen Darling please advise. thanks

## 2021-05-11 NOTE — Telephone Encounter (Signed)
Rov scheduled 05/18/2021 at 1:45. Patient is aware and voiced her understanding.

## 2021-05-12 ENCOUNTER — Ambulatory Visit: Payer: Medicare PPO

## 2021-05-18 ENCOUNTER — Encounter: Payer: Self-pay | Admitting: Internal Medicine

## 2021-05-18 ENCOUNTER — Ambulatory Visit: Payer: Medicare PPO | Admitting: Internal Medicine

## 2021-05-18 ENCOUNTER — Other Ambulatory Visit: Payer: Self-pay

## 2021-05-18 VITALS — BP 150/74 | HR 67 | Temp 97.3°F | Ht 63.0 in | Wt 164.0 lb

## 2021-05-18 DIAGNOSIS — G4733 Obstructive sleep apnea (adult) (pediatric): Secondary | ICD-10-CM

## 2021-05-18 DIAGNOSIS — R001 Bradycardia, unspecified: Secondary | ICD-10-CM

## 2021-05-18 NOTE — Patient Instructions (Addendum)
Continue CPAP as prescribed  PLEASE FOLLOW UP WITH CARDIOLOGY

## 2021-05-18 NOTE — Progress Notes (Signed)
@Patient  ID: Erin Middleton, female    DOB: 23-Dec-1945, 75 y.o.   MRN: [id number]   TESTS/DOWNLOADS Patient's home sleep study which was done in May/2020 showed moderate obstructive sleep apnea with an AHI of 22.6, SaO2 low 66%  10/2020 Compliance report 97% for days 9% for greater than 4 hours AHI reduced to 1.2 previous AHI of 22  CPAP DL 04/2021 Excellent compliance report 90% days and 83%>4 hrs AHI 1.2    Chief Complaint  Patient presents with   Follow-up    Patient is going good, no concerns   Follow up OSA Patient has low HR with exertions S/p PACEMAKER PLACEMENT AUG 2022    HPI:  Serial echocardiogram showed EF 55% with right ventricular systolic pressure 50 Patient sees cardiology and was supposed to get assessment for stress test next week Was admitted for severe syncopal episode ended with permanent pacemaker  Patient has had episodes of bradycardia in last several weeks No wheezing, no cough, no fevers  No exacerbation at this time No evidence of heart failure at this time No evidence or signs of infection at this time No respiratory distress No fevers, chills, nausea, vomiting, diarrhea No evidence of lower extremity edema No evidence hemoptysis   CBC    Component Value Date/Time   WBC 8.9 01/17/2021 1603   WBC 7.4 12/26/2020 1102   RBC 4.24 01/17/2021 1603   RBC 4.11 12/26/2020 1102   HGB 13.4 01/17/2021 1603   HCT 40.4 01/17/2021 1603   PLT 231 01/17/2021 1603   MCV 95 01/17/2021 1603   MCH 31.6 01/17/2021 1603   MCH 32.1 12/26/2020 1102   MCHC 33.2 01/17/2021 1603   MCHC 34.7 12/26/2020 1102   RDW 12.8 01/17/2021 1603   LYMPHSABS 2.5 01/17/2021 1603   MONOABS 0.4 12/26/2020 1102   EOSABS 0.4 01/17/2021 1603   BASOSABS 0.0 01/17/2021 1603    BMET    Component Value Date/Time   NA 141 01/17/2021 1603   K 4.1 01/17/2021 1603   CL 103 01/17/2021 1603   CO2 23 01/17/2021 1603   GLUCOSE 119 (H) 01/17/2021 1603   GLUCOSE 136 (H)  12/28/2020 0528   BUN 22 01/17/2021 1603   CREATININE 1.04 (H) 01/17/2021 1603   CREATININE 0.94 (H) 06/06/2017 1120   CALCIUM 9.5 01/17/2021 1603   GFRNONAA >60 12/28/2020 0528   GFRNONAA 60 06/06/2017 1120   GFRAA 66 08/03/2020 1120   GFRAA 70 06/06/2017 1120    BNP    Component Value Date/Time   BNP 81.0 09/19/2018 1120      No Known Allergies  Immunization History  Administered Date(s) Administered   Fluad Quad(high Dose 65+) 04/15/2019, 05/19/2020, 05/04/2021   Influenza, High Dose Seasonal PF 05/10/2016, 06/06/2017, 03/07/2018   Influenza-Unspecified 04/09/2015   PFIZER(Purple Top)SARS-COV-2 Vaccination 07/26/2019, 08/18/2019, 06/01/2020   Pneumococcal Conjugate-13 05/10/2016   Pneumococcal Polysaccharide-23 03/06/2012   Td 01/23/2003    Past Medical History:  Diagnosis Date   Arthritis    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 Negative for activity change and fever.  HENT:  Negative for sinus pressure, sinus pain and sore throat.   Respiratory:  Positive for shortness  of breath. Negative for cough and wheezing.   Cardiovascular:  Negative for chest pain and palpitations.  Gastrointestinal:  Negative for diarrhea, nausea and vomiting.  Musculoskeletal:  Negative for arthralgias.  Neurological:  Positive for syncope. Negative for dizziness.  Psychiatric/Behavioral:  Negative for sleep disturbance. The patient is not nervous/anxious.     BP (!) 150/74 (BP Location: Right Arm, Patient Position: Sitting, Cuff Size: Normal)   Pulse 67   Temp (!) 97.3 F (36.3 C) (Oral)   Ht 5\' 3"  (1.6 m)   Wt 164 lb (74.4 kg)   SpO2 97%   BMI 29.05 kg/m    Physical Examination:   General Appearance: No distress   Neuro:without focal findings,  speech normal,  HEENT: PERRLA, EOM intact.   Pulmonary: normal breath sounds, No wheezing.  CardiovascularNormal S1,S2.  No m/r/g.   ALL OTHER ROS ARE NEGATIVE     Assessment & Plan:   76 year old pleasant white female seen today for follow-up assessment for OSA Patient with low HR with exertion S/p pacemaker placement AUG 2022    PAH (pulmonary artery hypertension) ([place]) Previously seen on prior echocardiograms Most likely related to sleep apnea Continue therapy as prescribed  Follow up Cardiology ASAP for bradycardia  NO SIGN OF INFECTION AT THIS TIME     Patient  satisfied with Plan of action and management. All questions answered  Follow up 1 year  Total Time spent 12 mins  Erin Middleton, M.D.  [facility]  Medical Director [place] Director [facility] Cardio-Pulmonary Department

## 2021-05-19 ENCOUNTER — Other Ambulatory Visit: Payer: Self-pay | Admitting: Family Medicine

## 2021-05-19 DIAGNOSIS — R002 Palpitations: Secondary | ICD-10-CM | POA: Diagnosis not present

## 2021-05-19 DIAGNOSIS — E119 Type 2 diabetes mellitus without complications: Secondary | ICD-10-CM

## 2021-05-19 DIAGNOSIS — G4733 Obstructive sleep apnea (adult) (pediatric): Secondary | ICD-10-CM | POA: Diagnosis not present

## 2021-05-19 DIAGNOSIS — R011 Cardiac murmur, unspecified: Secondary | ICD-10-CM | POA: Diagnosis not present

## 2021-05-19 DIAGNOSIS — R55 Syncope and collapse: Secondary | ICD-10-CM | POA: Diagnosis not present

## 2021-05-19 DIAGNOSIS — I272 Pulmonary hypertension, unspecified: Secondary | ICD-10-CM | POA: Diagnosis not present

## 2021-05-19 DIAGNOSIS — Z853 Personal history of malignant neoplasm of breast: Secondary | ICD-10-CM | POA: Diagnosis not present

## 2021-05-19 DIAGNOSIS — Q231 Congenital insufficiency of aortic valve: Secondary | ICD-10-CM | POA: Diagnosis not present

## 2021-05-19 DIAGNOSIS — I10 Essential (primary) hypertension: Secondary | ICD-10-CM | POA: Diagnosis not present

## 2021-05-19 DIAGNOSIS — I071 Rheumatic tricuspid insufficiency: Secondary | ICD-10-CM | POA: Diagnosis not present

## 2021-05-19 DIAGNOSIS — I442 Atrioventricular block, complete: Secondary | ICD-10-CM | POA: Diagnosis not present

## 2021-05-19 NOTE — Telephone Encounter (Signed)
Requested Prescriptions  Pending Prescriptions Disp Refills  . metFORMIN (GLUCOPHAGE) 500 MG tablet [Pharmacy Med Name: METFORMIN HCL 500 MG TABLET] 90 tablet 0    Sig: TAKE 1 TABLET BY MOUTH DAILY WITH SUPPER.     Endocrinology:  Diabetes - Biguanides Failed - 05/19/2021  1:46 AM      Failed - Cr in normal range and within 360 days    Creat  Date Value Ref Range Status  06/06/2017 0.94 (H) 0.60 - 0.93 mg/dL Final    Comment:    For patients >77 years of age, the reference limit for Creatinine is approximately 13% higher for people identified as African-American. .    Creatinine, Ser  Date Value Ref Range Status  01/17/2021 1.04 (H) 0.57 - 1.00 mg/dL Final         Failed - eGFR in normal range and within 360 days    GFR, Est African American  Date Value Ref Range Status  06/06/2017 70 > OR = 60 mL/min/1.75m2 Final   GFR calc Af Amer  Date Value Ref Range Status  08/03/2020 66 >59 mL/min/1.73 Final    Comment:    **In accordance with recommendations from the NKF-ASN Task force,**   [facility] is in the process of updating its eGFR calculation to the   2021 CKD-EPI creatinine equation that estimates kidney function   without a race variable.    GFR, Est Non African American  Date Value Ref Range Status  06/06/2017 60 > OR = 60 mL/min/1.57m2 Final   GFR, Estimated  Date Value Ref Range Status  12/28/2020 >60 >60 mL/min Final    Comment:    (NOTE) Calculated using the CKD-EPI Creatinine Equation (2021)    eGFR  Date Value Ref Range Status  01/17/2021 55 (L) >59 mL/min/1.73 Final         Passed - HBA1C is between 0 and 7.9 and within 180 days    Hgb A1c MFr Bld  Date Value Ref Range Status  12/21/2020 7.0 (H) 4.8 - 5.6 % Final    Comment:    (NOTE) Pre diabetes:          5.7%-6.4%  Diabetes:              >6.4%  Glycemic control for   <7.0% adults with diabetes          Passed - Valid encounter within last 6 months    Recent Outpatient Visits          4  months ago Shortness of breath   [facility] Jerrol Banana., MD   9 months ago Encounter for [employer] annual wellness exam   [facility] Jerrol Banana., MD   1 year ago Essential hypertension   [facility] Jerrol Banana., [facility]   1 year ago Essential hypertension   [facility] Jerrol Banana., MD   1 year ago Annual physical exam   [facility] Jerrol Banana., MD             . telmisartan (MICARDIS) 80 MG tablet [Pharmacy Med Name: TELMISARTAN 80 MG TABLET] 90 tablet 0    Sig: TAKE 1 TABLET BY MOUTH EVERY DAY     Cardiovascular:  Angiotensin Receptor Blockers Failed - 05/19/2021  1:46 AM      Failed - Cr in normal range and within 180 days    Creat  Date Value Ref Range Status  06/06/2017  0.94 (H) 0.60 - 0.93 mg/dL Final    Comment:    For patients >37 years of age, the reference limit for Creatinine is approximately 13% higher for people identified as African-American. .    Creatinine, Ser  Date Value Ref Range Status  01/17/2021 1.04 (H) 0.57 - 1.00 mg/dL Final         Failed - Last BP in normal range    BP Readings from Last 1 Encounters:  05/18/21 (!) 150/74         Passed - K in normal range and within 180 days    Potassium  Date Value Ref Range Status  01/17/2021 4.1 3.5 - 5.2 mmol/L Final         Passed - Patient is not pregnant      Passed - Valid encounter within last 6 months    Recent Outpatient Visits          4 months ago Shortness of breath   [facility] Jerrol Banana., MD   9 months ago Encounter for [employer] annual wellness exam   [facility] Jerrol Banana., MD   1 year ago Essential hypertension   [facility] Jerrol Banana., MD   1 year ago Essential hypertension   [facility] Jerrol Banana., MD   1 year ago Annual physical exam    [facility] Jerrol Banana., MD

## 2021-05-23 ENCOUNTER — Encounter (INDEPENDENT_AMBULATORY_CARE_PROVIDER_SITE_OTHER): Payer: Medicare PPO | Admitting: Ophthalmology

## 2021-05-23 ENCOUNTER — Other Ambulatory Visit: Payer: Self-pay

## 2021-05-23 DIAGNOSIS — H353114 Nonexudative age-related macular degeneration, right eye, advanced atrophic with subfoveal involvement: Secondary | ICD-10-CM | POA: Diagnosis not present

## 2021-05-23 DIAGNOSIS — I10 Essential (primary) hypertension: Secondary | ICD-10-CM | POA: Diagnosis not present

## 2021-05-23 DIAGNOSIS — H43813 Vitreous degeneration, bilateral: Secondary | ICD-10-CM | POA: Diagnosis not present

## 2021-05-23 DIAGNOSIS — H353122 Nonexudative age-related macular degeneration, left eye, intermediate dry stage: Secondary | ICD-10-CM

## 2021-05-23 DIAGNOSIS — H35033 Hypertensive retinopathy, bilateral: Secondary | ICD-10-CM | POA: Diagnosis not present

## 2021-05-30 ENCOUNTER — Encounter: Payer: Self-pay | Admitting: Family Medicine

## 2021-05-30 ENCOUNTER — Ambulatory Visit (INDEPENDENT_AMBULATORY_CARE_PROVIDER_SITE_OTHER): Payer: Medicare PPO | Admitting: Family Medicine

## 2021-05-30 ENCOUNTER — Other Ambulatory Visit: Payer: Self-pay

## 2021-05-30 VITALS — BP 140/86 | HR 68 | Ht 63.0 in | Wt 164.0 lb

## 2021-05-30 DIAGNOSIS — Z0000 Encounter for general adult medical examination without abnormal findings: Secondary | ICD-10-CM

## 2021-05-30 DIAGNOSIS — E7800 Pure hypercholesterolemia, unspecified: Secondary | ICD-10-CM | POA: Diagnosis not present

## 2021-05-30 DIAGNOSIS — Z853 Personal history of malignant neoplasm of breast: Secondary | ICD-10-CM

## 2021-05-30 DIAGNOSIS — I10 Essential (primary) hypertension: Secondary | ICD-10-CM

## 2021-05-30 DIAGNOSIS — L570 Actinic keratosis: Secondary | ICD-10-CM | POA: Diagnosis not present

## 2021-05-30 DIAGNOSIS — G4733 Obstructive sleep apnea (adult) (pediatric): Secondary | ICD-10-CM | POA: Diagnosis not present

## 2021-05-30 DIAGNOSIS — Z96659 Presence of unspecified artificial knee joint: Secondary | ICD-10-CM

## 2021-05-30 DIAGNOSIS — E119 Type 2 diabetes mellitus without complications: Secondary | ICD-10-CM

## 2021-05-30 DIAGNOSIS — I442 Atrioventricular block, complete: Secondary | ICD-10-CM

## 2021-05-30 DIAGNOSIS — Z9989 Dependence on other enabling machines and devices: Secondary | ICD-10-CM

## 2021-05-30 MED ORDER — ACCU-CHEK SOFTCLIX LANCETS MISC: 12 refills | Status: AC

## 2021-05-30 NOTE — Progress Notes (Signed)
Annual Wellness Visit     Patient: Erin Middleton, Female    DOB: [birth year], 75 y.o.   MRN: [id number] Visit Date: 05/30/2021  Today's Provider: Wilhemena Durie, MD   Chief Complaint  Patient presents with   Medicare Wellness    Subjective    Erin Middleton is a 75 y.o. female who presents today for her Annual Physical. She reports consuming a general diet.  She generally feels fairly well. She reports sleeping well. She does not have additional problems to discuss today.  She has been widowed for about a year after her husband died of COVID/CAD.  she lives independently. HPI She has been undergoing a cardiology work-up recently.   Medications: Outpatient Medications Prior to Visit  Medication Sig   ACCU-CHEK GUIDE test strip CHECK BLOOD SUGAR EVERY DAY   amLODipine (NORVASC) 5 MG tablet TAKE 1 TABLET BY MOUTH EVERY DAY IN THE EVENING   blood glucose meter kit and supplies Dispense based on patient and insurance preference. Check blood sugar once a day (FOR ICD-10 E10.9, E11.9).   cholecalciferol (VITAMIN D3) 25 MCG (1000 UNIT) tablet Take 1,000 Units by mouth daily.   hydrochlorothiazide (MICROZIDE) 12.5 MG capsule TAKE 1 CAPSULE BY MOUTH EVERY DAY   metFORMIN (GLUCOPHAGE) 500 MG tablet TAKE 1 TABLET BY MOUTH DAILY WITH SUPPER.   Multiple Vitamins-Minerals (PRESERVISION/LUTEIN) CAPS Take 1 capsule by mouth at bedtime.   omeprazole (PRILOSEC) 20 MG capsule TAKE 1 CAPSULE BY MOUTH EVERY DAY IN THE MORNING   rosuvastatin (CRESTOR) 10 MG tablet TAKE 1 TABLET BY MOUTH EVERY DAY   telmisartan (MICARDIS) 80 MG tablet TAKE 1 TABLET BY MOUTH EVERY DAY   vitamin E 400 UNIT capsule Take 400 Units by mouth daily. Reported on 09/15/2015   [DISCONTINUED] Accu-Chek Softclix Lancets lancets CHECK BLOOD SUGARL ONCE DAILY   No facility-administered medications prior to visit.    No Known Allergies  Patient Care Team: Jerrol Banana., MD as PCP - General (Family  Medicine) Earnestine Leys, MD as Consulting Physician (Specialist) Ubaldo Glassing Javier Docker, MD as Consulting Physician (Cardiology) Rhea Bleacher as Physician Assistant (Gastroenterology) Magdalen Spatz, NP as Nurse Practitioner (Pulmonary Disease)  Review of Systems  All other systems reviewed and are negative.       Objective    Vitals: BP 140/86 (BP Location: Right Arm, Patient Position: Sitting, Cuff Size: Normal)   Pulse 68   Ht $R'5\' 3"'bY$  (1.6 m)   Wt 164 lb (74.4 kg)   SpO2 97%   BMI 29.05 kg/m  BP Readings from Last 3 Encounters:  05/30/21 140/86  05/18/21 (!) 150/74  04/26/21 124/70   Wt Readings from Last 3 Encounters:  05/30/21 164 lb (74.4 kg)  05/18/21 164 lb (74.4 kg)  04/26/21 163 lb 9.6 oz (74.2 kg)      Physical Exam Vitals reviewed. Exam conducted with a chaperone present.  Constitutional:      Appearance: Normal appearance. She is normal weight.  HENT:     Head: Normocephalic and atraumatic.     Right Ear: Tympanic membrane normal.     Left Ear: Tympanic membrane normal.     Nose: Nose normal.     Mouth/Throat:     Mouth: Mucous membranes are moist.     Pharynx: Oropharynx is clear.  Eyes:     Extraocular Movements: Extraocular movements intact.     Conjunctiva/sclera: Conjunctivae normal.     Pupils: Pupils are equal, round,  and reactive to light.  Cardiovascular:     Rate and Rhythm: Normal rate and regular rhythm.     Pulses: Normal pulses.     Heart sounds: Murmur heard.     Comments: 2/6 murmur at LUSB. Pulmonary:     Effort: Pulmonary effort is normal.     Breath sounds: Normal breath sounds.  Chest:  Breasts:    Right: No mass.  Abdominal:     General: Abdomen is flat. Bowel sounds are normal.     Palpations: Abdomen is soft.  Musculoskeletal:     Cervical back: Normal range of motion and neck supple.     Comments: Mild chronic lymphedema  Skin:    General: Skin is warm and dry.  Neurological:     General: No focal deficit  present.     Mental Status: She is alert and oriented to person, place, and time.     Motor: No weakness.     Gait: Gait normal.  Psychiatric:        Mood and Affect: Mood normal.        Behavior: Behavior normal.        Thought Content: Thought content normal.        Judgment: Judgment normal.     Most recent functional status assessment: In your present state of health, do you have any difficulty performing the following activities: 05/30/2021  Hearing? N  Vision? Y  Difficulty concentrating or making decisions? N  Walking or climbing stairs? Y  Dressing or bathing? N  Doing errands, shopping? N  Some recent data might be hidden   Most recent fall risk assessment: Fall Risk  08/03/2020  Falls in the past year? 0  Number falls in past yr: 0  Injury with Fall? 0  Risk for fall due to : No Fall Risks  Follow up Falls evaluation completed    Most recent depression screenings: PHQ 2/9 Scores 05/30/2021 08/03/2020  PHQ - 2 Score 1 2  PHQ- 9 Score 3 6   Most recent cognitive screening: 6CIT Screen 05/10/2016  What Year? 0 points  What month? 0 points  What time? 0 points  Count back from 20 0 points  Months in reverse 0 points  Repeat phrase 4 points  Total Score 4   Most recent Audit-C alcohol use screening Alcohol Use Disorder Test (AUDIT) 08/03/2020  1. How often do you have a drink containing alcohol? 0  2. How many drinks containing alcohol do you have on a typical day when you are drinking? 0  3. How often do you have six or more drinks on one occasion? 0  AUDIT-C Score 0  Alcohol Brief Interventions/Follow-up AUDIT Score <7 follow-up not indicated   A score of 3 or more in women, and 4 or more in men indicates increased risk for alcohol abuse, EXCEPT if all of the points are from question 1   No results found for any visits on 05/30/21.  Assessment & Plan     Annual wellness visit done today including the all of the following: Reviewed patient's Family Medical  History Reviewed and updated list of patient's medical providers Assessment of cognitive impairment was done Assessed patient's functional ability Established a written schedule for health screening [place] Completed and Reviewed  Exercise Activities and Dietary recommendations  Goals      Exercise 3x per week (30 min per time)     Recommend to start walking at least 3 times a  week for 30 minutes at a time.        Immunization History  Administered Date(s) Administered   Fluad Quad(high Dose 65+) 04/15/2019, 05/19/2020, 05/04/2021   Influenza, High Dose Seasonal PF 05/10/2016, 06/06/2017, 03/07/2018   Influenza-Unspecified 04/09/2015   PFIZER(Purple Top)SARS-COV-2 Vaccination 07/26/2019, 08/18/2019, 06/01/2020   Pneumococcal Conjugate-13 05/10/2016   Pneumococcal Polysaccharide-23 03/06/2012   Td 01/23/2003    Health Maintenance  Topic Date Due   FOOT EXAM  Never done   OPHTHALMOLOGY EXAM  Never done   Zoster Vaccines- Shingrix (1 of 2) Never done   TETANUS/TDAP  01/22/2013   COVID-19 Vaccine (4 - Booster for Pfizer series) 07/27/2020   DEXA SCAN  03/14/2021   HEMOGLOBIN A1C  06/22/2021   COLONOSCOPY (Pts 45-73yrs Insurance coverage will need to be confirmed)  11/09/2025   Pneumonia Vaccine 21+ Years old  Completed   INFLUENZA VACCINE  Completed   Hepatitis C Screening  Completed   HPV VACCINES  Aged Out     Discussed health benefits of physical activity, and encouraged her to engage in regular exercise appropriate for her age and condition.    1. Annual physical exam   2. Essential hypertension   3. Controlled type 2 diabetes mellitus without complication, without long-term current use of insulin (HCC)  - Hemoglobin A1c - Accu-Chek Softclix Lancets lancets; CHECK BLOOD SUGAR ONCE DAILY  Dispense: 100 each; Refill: 12  4. Pure hypercholesterolemia  - Lipid panel - TSH  5. AK (actinic keratosis)  - Ambulatory referral to  Dermatology  6. OSA on CPAP   7. Status post total knee replacement, unspecified laterality   8. History of left breast cancer   9. 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  Cardiology following patient regularly.   Return in about 5 months (around 10/28/2021).     I, Wilhemena Durie, MD, have reviewed all documentation for this visit. The documentation on 05/31/21 for the exam, diagnosis, procedures, and orders are all accurate and complete.    Luken Shadowens Cranford Mon, MD  [facility] [phone] (phone) [phone] (fax)  [place]

## 2021-05-31 LAB — TSH: TSH: 2.57 u[IU]/mL (ref 0.450–4.500)

## 2021-05-31 LAB — HEMOGLOBIN A1C
Est. average glucose Bld gHb Est-mCnc: 148 mg/dL
Hgb A1c MFr Bld: 6.8 % — ABNORMAL HIGH (ref 4.8–5.6)

## 2021-05-31 LAB — LIPID PANEL
Chol/HDL Ratio: 1.9 ratio (ref 0.0–4.4)
Cholesterol, Total: 112 mg/dL (ref 100–199)
HDL: 60 mg/dL (ref >39)
LDL Chol Calc (NIH): 38 mg/dL (ref 0–99)
Triglycerides: 67 mg/dL (ref 0–149)
VLDL Cholesterol Cal: 14 mg/dL (ref 5–40)

## 2021-06-13 DIAGNOSIS — I442 Atrioventricular block, complete: Secondary | ICD-10-CM | POA: Diagnosis not present

## 2021-06-15 DIAGNOSIS — I442 Atrioventricular block, complete: Secondary | ICD-10-CM | POA: Diagnosis not present

## 2021-06-15 DIAGNOSIS — R002 Palpitations: Secondary | ICD-10-CM | POA: Diagnosis not present

## 2021-06-15 DIAGNOSIS — I272 Pulmonary hypertension, unspecified: Secondary | ICD-10-CM | POA: Diagnosis not present

## 2021-06-15 DIAGNOSIS — I10 Essential (primary) hypertension: Secondary | ICD-10-CM | POA: Diagnosis not present

## 2021-06-15 DIAGNOSIS — R0789 Other chest pain: Secondary | ICD-10-CM | POA: Diagnosis not present

## 2021-06-15 DIAGNOSIS — R55 Syncope and collapse: Secondary | ICD-10-CM | POA: Diagnosis not present

## 2021-07-27 DIAGNOSIS — Q231 Congenital insufficiency of aortic valve: Secondary | ICD-10-CM | POA: Diagnosis not present

## 2021-07-27 DIAGNOSIS — I272 Pulmonary hypertension, unspecified: Secondary | ICD-10-CM | POA: Diagnosis not present

## 2021-07-27 DIAGNOSIS — I442 Atrioventricular block, complete: Secondary | ICD-10-CM | POA: Diagnosis not present

## 2021-07-27 DIAGNOSIS — R002 Palpitations: Secondary | ICD-10-CM | POA: Diagnosis not present

## 2021-07-27 DIAGNOSIS — I10 Essential (primary) hypertension: Secondary | ICD-10-CM | POA: Diagnosis not present

## 2021-08-12 ENCOUNTER — Other Ambulatory Visit: Payer: Self-pay | Admitting: Family Medicine

## 2021-08-12 DIAGNOSIS — Z1231 Encounter for screening mammogram for malignant neoplasm of breast: Secondary | ICD-10-CM

## 2021-08-24 DIAGNOSIS — R55 Syncope and collapse: Secondary | ICD-10-CM | POA: Diagnosis not present

## 2021-08-24 DIAGNOSIS — R002 Palpitations: Secondary | ICD-10-CM | POA: Diagnosis not present

## 2021-08-24 DIAGNOSIS — R0789 Other chest pain: Secondary | ICD-10-CM | POA: Diagnosis not present

## 2021-08-24 DIAGNOSIS — I2510 Atherosclerotic heart disease of native coronary artery without angina pectoris: Secondary | ICD-10-CM | POA: Diagnosis not present

## 2021-08-24 DIAGNOSIS — I368 Other nonrheumatic tricuspid valve disorders: Secondary | ICD-10-CM | POA: Diagnosis not present

## 2021-08-25 ENCOUNTER — Other Ambulatory Visit: Payer: Self-pay | Admitting: Family Medicine

## 2021-08-25 DIAGNOSIS — E119 Type 2 diabetes mellitus without complications: Secondary | ICD-10-CM

## 2021-09-08 ENCOUNTER — Other Ambulatory Visit: Payer: Self-pay

## 2021-09-08 ENCOUNTER — Ambulatory Visit
Admission: RE | Admit: 2021-09-08 | Discharge: 2021-09-08 | Disposition: A | Discharge status: Home / Self Care | Payer: Medicare PPO | Source: Ambulatory Visit | Attending: Family Medicine | Admitting: Family Medicine

## 2021-09-08 DIAGNOSIS — Z1231 Encounter for screening mammogram for malignant neoplasm of breast: Secondary | ICD-10-CM | POA: Diagnosis not present

## 2021-10-03 ENCOUNTER — Other Ambulatory Visit: Payer: Self-pay | Admitting: Family Medicine

## 2021-10-03 DIAGNOSIS — K219 Gastro-esophageal reflux disease without esophagitis: Secondary | ICD-10-CM

## 2021-10-25 DIAGNOSIS — M79604 Pain in right leg: Secondary | ICD-10-CM | POA: Diagnosis not present

## 2021-10-25 DIAGNOSIS — I272 Pulmonary hypertension, unspecified: Secondary | ICD-10-CM | POA: Diagnosis not present

## 2021-10-25 DIAGNOSIS — I071 Rheumatic tricuspid insufficiency: Secondary | ICD-10-CM | POA: Diagnosis not present

## 2021-10-25 DIAGNOSIS — R011 Cardiac murmur, unspecified: Secondary | ICD-10-CM | POA: Diagnosis not present

## 2021-10-25 DIAGNOSIS — I10 Essential (primary) hypertension: Secondary | ICD-10-CM | POA: Diagnosis not present

## 2021-10-26 NOTE — Progress Notes (Signed)
? ?I,Elena D DeSanto,acting as a scribe for Wilhemena Durie, MD.,have documented all relevant documentation on the behalf of Maddisyn Hegwood, MD,as directed by  Wilhemena Durie, MD while in the presence of Wilhemena Durie, MD. ?  ? ? ?Established patient visit ? ? ?Patient: Erin Middleton   DOB: 11/22/1945   76 y.o. Female  MRN: [id number] ?Visit Date: 10/27/2021 ? ?Today's healthcare provider: Wilhemena Durie, MD  ? ?No chief complaint on file. ? ?Subjective  ?  ?HPI  ?Patient comes in today for follow-up.  She is feeling well.  No recent falls and no other complaints. ?Hypertension, follow-up ? ?BP Readings from Last 3 Encounters:  ?10/27/21 116/69  ?05/30/21 140/86  ?05/18/21 (!) 150/74  ? Wt Readings from Last 3 Encounters:  ?10/27/21 165 lb (74.8 kg)  ?05/30/21 164 lb (74.4 kg)  ?05/18/21 164 lb (74.4 kg)  ?  ? ?She was last seen for hypertension 5 months ago.  ?BP at that visit was as above. Management since that visit includes none. ? ?Symptoms: ?No chest pain No chest pressure  ?No palpitations No syncope  ?No dyspnea No orthopnea  ?No paroxysmal nocturnal dyspnea Yes lower extremity edema  ? ?Pertinent labs ?Lab Results  ?Component Value Date  ? CHOL 112 05/30/2021  ? HDL 60 05/30/2021  ? [place] 38 05/30/2021  ? TRIG 67 05/30/2021  ? CHOLHDL 1.9 05/30/2021  ? Lab Results  ?Component Value Date  ? NA 141 01/17/2021  ? K 4.1 01/17/2021  ? CREATININE 1.04 (H) 01/17/2021  ? EGFR 55 (L) 01/17/2021  ? GLUCOSE 119 (H) 01/17/2021  ? TSH 2.570 05/30/2021  ?  ? ?The ASCVD Risk score (Arnett DK, et al., 2019) failed to calculate for the following reasons: ?  The valid total cholesterol range is 130 to 320 mg/dL ? ?--------------------------------------------------------------------------------------------------- ?Diabetes Mellitus Type II, Follow-up ? ?Lab Results  ?Component Value Date  ? HGBA1C 6.8 (H) 05/30/2021  ? HGBA1C 7.0 (H) 12/21/2020  ? HGBA1C 6.6 (H) 08/03/2020  ? ?Wt Readings from Last 3  Encounters:  ?10/27/21 165 lb (74.8 kg)  ?05/30/21 164 lb (74.4 kg)  ?05/18/21 164 lb (74.4 kg)  ? ?Last seen for diabetes 6 months ago.  ?Management since then includes none. ? ?Symptoms: ?No fatigue No foot ulcerations  ?No appetite changes No nausea  ?Yes paresthesia of the feet  No polydipsia  ?No polyuria No visual disturbances   ?No vomiting   ? ? ?Home blood sugar records:  150-180 ? ?Episodes of hypoglycemia?  no ?  ?Current insulin regiment: NA ?Most Recent Eye Exam: Patient is due for a diabetic foot and eye exam ? ? ?Pertinent Labs: ?Lab Results  ?Component Value Date  ? CHOL 112 05/30/2021  ? HDL 60 05/30/2021  ? [place] 38 05/30/2021  ? TRIG 67 05/30/2021  ? CHOLHDL 1.9 05/30/2021  ? Lab Results  ?Component Value Date  ? NA 141 01/17/2021  ? K 4.1 01/17/2021  ? CREATININE 1.04 (H) 01/17/2021  ? EGFR 55 (L) 01/17/2021  ?  ? ?--------------------------------------------------------------------------------------------------- ?Patient also complains of neck pain for a few weeks. ? ?Medications: ?Outpatient Medications Prior to Visit  ?Medication Sig  ? ACCU-CHEK GUIDE test strip CHECK BLOOD SUGAR EVERY DAY  ? Accu-Chek Softclix Lancets lancets CHECK BLOOD SUGAR ONCE DAILY  ? amLODipine (NORVASC) 5 MG tablet TAKE 1 TABLET BY MOUTH EVERY DAY IN THE EVENING  ? blood glucose meter kit and supplies Dispense based on patient and  insurance preference. Check blood sugar once a day (FOR ICD-10 E10.9, E11.9).  ? cholecalciferol (VITAMIN D3) 25 MCG (1000 UNIT) tablet Take 1,000 Units by mouth daily.  ? hydrochlorothiazide (MICROZIDE) 12.5 MG capsule TAKE 1 CAPSULE BY MOUTH EVERY DAY  ? metFORMIN (GLUCOPHAGE) 500 MG tablet TAKE 1 TABLET BY MOUTH DAILY WITH SUPPER  ? Multiple Vitamins-Minerals (PRESERVISION/LUTEIN) CAPS Take 1 capsule by mouth at bedtime.  ? omeprazole (PRILOSEC) 20 MG capsule TAKE 1 CAPSULE BY MOUTH EVERY DAY IN THE MORNING  ? rosuvastatin (CRESTOR) 10 MG tablet TAKE 1 TABLET BY MOUTH EVERY DAY  ?  telmisartan (MICARDIS) 80 MG tablet TAKE 1 TABLET BY MOUTH EVERY DAY  ? vitamin E 400 UNIT capsule Take 400 Units by mouth daily. Reported on 09/15/2015  ? ?No facility-administered medications prior to visit.  ? ? ?Review of Systems ? ?Last hemoglobin A1c ?Lab Results  ?Component Value Date  ? HGBA1C 7.7 (H) 10/27/2021  ? ?  ?  Objective  ?  ?BP 116/69 (BP Location: Right Arm, Patient Position: Sitting, Cuff Size: Normal)   Pulse 71   Temp 98.1 ?F (36.7 ?C) (Oral)   Wt 165 lb (74.8 kg)   SpO2 97%   BMI 29.23 kg/m?  ?BP Readings from Last 3 Encounters:  ?10/27/21 116/69  ?05/30/21 140/86  ?05/18/21 (!) 150/74  ? ?Wt Readings from Last 3 Encounters:  ?10/27/21 165 lb (74.8 kg)  ?05/30/21 164 lb (74.4 kg)  ?05/18/21 164 lb (74.4 kg)  ? ?  ? ?Physical Exam ?Vitals reviewed. Exam conducted with a chaperone present.  ?Constitutional:   ?   Appearance: Normal appearance. She is normal weight.  ?HENT:  ?   Head: Normocephalic and atraumatic.  ?   Right Ear: Tympanic membrane normal.  ?   Left Ear: Tympanic membrane normal.  ?   Nose: Nose normal.  ?   Mouth/Throat:  ?   Mouth: Mucous membranes are moist.  ?   Pharynx: Oropharynx is clear.  ?Eyes:  ?   Extraocular Movements: Extraocular movements intact.  ?   Conjunctiva/sclera: Conjunctivae normal.  ?   Pupils: Pupils are equal, round, and reactive to light.  ?Cardiovascular:  ?   Rate and Rhythm: Normal rate and regular rhythm.  ?   Pulses: Normal pulses.  ?   Heart sounds: Murmur heard.  ?   Comments: 2/6 murmur at LUSB. ?Pulmonary:  ?   Effort: Pulmonary effort is normal.  ?   Breath sounds: Normal breath sounds.  ?Chest:  ?Breasts: ?   Right: No mass.  ?Abdominal:  ?   General: Abdomen is flat. Bowel sounds are normal.  ?   Palpations: Abdomen is soft.  ?Musculoskeletal:  ?   Cervical back: Normal range of motion and neck supple.  ?Skin: ?   General: Skin is warm and dry.  ?Neurological:  ?   General: No focal deficit present.  ?   Mental Status: She is alert and  oriented to person, place, and time. Mental status is at baseline.  ?   Motor: No weakness.  ?   Gait: Gait normal.  ?Psychiatric:     ?   Mood and Affect: Mood normal.     ?   Behavior: Behavior normal.     ?   Thought Content: Thought content normal.     ?   Judgment: Judgment normal.  ?  ? ? ?No results found for any visits on 10/27/21. ? Assessment & Plan  ?  ? ?  1. Essential hypertension ?Well-controlled with good blood pressure today.  Continue to monitor home blood pressure ?- Comprehensive metabolic panel ? ?2. Controlled type 2 diabetes mellitus without complication, without long-term current use of insulin ([place]) ?  Goal would be less than 7-7.5 in this patient.  Last A1c was 6.8. ?- Comprehensive metabolic panel ?- Hemoglobin A1c ? ?3. Pure hypercholesterolemia ?On rosuvastatin 10 ? ?4. Pacemaker ?Followed by cardiology ? ?5. OSA on CPAP ? ? ?6. PHT (pulmonary hypertension) ([place]) ? ? ?7. History of breast cancer ? ? ? ?No follow-ups on file.  ?   ? ?I, Wilhemena Durie, MD, have reviewed all documentation for this visit. The documentation on 11/01/21 for the exam, diagnosis, procedures, and orders are all accurate and complete. ? ? ? ?Deandra Gadson Cranford Mon, MD  ?[facility] ?[phone] (phone) ?[phone] (fax) ? ?[facility] ?

## 2021-10-27 ENCOUNTER — Ambulatory Visit: Payer: Medicare PPO | Admitting: Family Medicine

## 2021-10-27 VITALS — BP 116/69 | HR 71 | Temp 98.1°F | Wt 165.0 lb

## 2021-10-27 DIAGNOSIS — Z950 Presence of cardiac pacemaker: Secondary | ICD-10-CM | POA: Diagnosis not present

## 2021-10-27 DIAGNOSIS — G4733 Obstructive sleep apnea (adult) (pediatric): Secondary | ICD-10-CM

## 2021-10-27 DIAGNOSIS — E119 Type 2 diabetes mellitus without complications: Secondary | ICD-10-CM | POA: Diagnosis not present

## 2021-10-27 DIAGNOSIS — I10 Essential (primary) hypertension: Secondary | ICD-10-CM

## 2021-10-27 DIAGNOSIS — E7800 Pure hypercholesterolemia, unspecified: Secondary | ICD-10-CM

## 2021-10-27 DIAGNOSIS — Z9989 Dependence on other enabling machines and devices: Secondary | ICD-10-CM

## 2021-10-27 DIAGNOSIS — Z853 Personal history of malignant neoplasm of breast: Secondary | ICD-10-CM

## 2021-10-27 DIAGNOSIS — I272 Pulmonary hypertension, unspecified: Secondary | ICD-10-CM

## 2021-10-28 LAB — COMPREHENSIVE METABOLIC PANEL
ALT: 18 IU/L (ref 0–32)
AST: 19 IU/L (ref 0–40)
Albumin/Globulin Ratio: 1.5 (ref 1.2–2.2)
Albumin: 4.2 g/dL (ref 3.7–4.7)
Alkaline Phosphatase: 97 IU/L (ref 44–121)
BUN/Creatinine Ratio: 18 (ref 12–28)
BUN: 19 mg/dL (ref 8–27)
Bilirubin Total: 0.4 mg/dL (ref 0.0–1.2)
CO2: 22 mmol/L (ref 20–29)
Calcium: 9.3 mg/dL (ref 8.7–10.3)
Chloride: 102 mmol/L (ref 96–106)
Creatinine, Ser: 1.03 mg/dL — ABNORMAL HIGH (ref 0.57–1.00)
Globulin, Total: 2.8 g/dL (ref 1.5–4.5)
Glucose: 201 mg/dL — ABNORMAL HIGH (ref 70–99)
Potassium: 4.2 mmol/L (ref 3.5–5.2)
Sodium: 141 mmol/L (ref 134–144)
Total Protein: 7 g/dL (ref 6.0–8.5)
eGFR: 56 mL/min/{1.73_m2} — ABNORMAL LOW (ref >59)

## 2021-10-28 LAB — HEMOGLOBIN A1C
Est. average glucose Bld gHb Est-mCnc: 174 mg/dL
Hgb A1c MFr Bld: 7.7 % — ABNORMAL HIGH (ref 4.8–5.6)

## 2021-11-01 ENCOUNTER — Other Ambulatory Visit: Payer: Self-pay | Admitting: *Deleted

## 2021-11-01 DIAGNOSIS — E119 Type 2 diabetes mellitus without complications: Secondary | ICD-10-CM

## 2021-11-01 MED ORDER — METFORMIN HCL 500 MG PO TABS: 500.0000 mg | ORAL_TABLET | Freq: Two times a day (BID) | ORAL | 1 refills | Status: AC

## 2021-11-14 DIAGNOSIS — M79604 Pain in right leg: Secondary | ICD-10-CM | POA: Diagnosis not present

## 2021-11-21 ENCOUNTER — Encounter (INDEPENDENT_AMBULATORY_CARE_PROVIDER_SITE_OTHER): Payer: Medicare PPO | Admitting: Ophthalmology

## 2021-11-21 ENCOUNTER — Other Ambulatory Visit: Payer: Self-pay | Admitting: Family Medicine

## 2021-11-21 DIAGNOSIS — H35033 Hypertensive retinopathy, bilateral: Secondary | ICD-10-CM

## 2021-11-21 DIAGNOSIS — H353132 Nonexudative age-related macular degeneration, bilateral, intermediate dry stage: Secondary | ICD-10-CM | POA: Diagnosis not present

## 2021-11-21 DIAGNOSIS — I10 Essential (primary) hypertension: Secondary | ICD-10-CM | POA: Diagnosis not present

## 2021-11-21 DIAGNOSIS — H43813 Vitreous degeneration, bilateral: Secondary | ICD-10-CM | POA: Diagnosis not present

## 2021-11-30 ENCOUNTER — Ambulatory Visit: Payer: Medicare PPO | Admitting: Dermatology

## 2021-11-30 DIAGNOSIS — L820 Inflamed seborrheic keratosis: Secondary | ICD-10-CM | POA: Diagnosis not present

## 2021-11-30 DIAGNOSIS — R21 Rash and other nonspecific skin eruption: Secondary | ICD-10-CM | POA: Diagnosis not present

## 2021-11-30 DIAGNOSIS — L821 Other seborrheic keratosis: Secondary | ICD-10-CM

## 2021-11-30 NOTE — Patient Instructions (Addendum)
Cryotherapy Aftercare  Wash gently with soap and water everyday.   Apply Vaseline and Band-Aid daily until healed.    Seborrheic Keratosis  What causes seborrheic keratoses? Seborrheic keratoses are harmless, common skin growths that first appear during adult life.  As time goes by, more growths appear.  Some people may develop a large number of them.  Seborrheic keratoses appear on both covered and uncovered body parts.  They are not caused by sunlight.  The tendency to develop seborrheic keratoses can be inherited.  They vary in color from skin-colored to gray, brown, or even black.  They can be either smooth or have a rough, warty surface.   Seborrheic keratoses are superficial and look as if they were stuck on the skin.  Under the microscope this type of keratosis looks like layers upon layers of skin.  That is why at times the top layer may seem to fall off, but the rest of the growth remains and re-grows.    Treatment Seborrheic keratoses do not need to be treated, but can easily be removed in the office.  Seborrheic keratoses often cause symptoms when they rub on clothing or jewelry.  Lesions can be in the way of shaving.  If they become inflamed, they can cause itching, soreness, or burning.  Removal of a seborrheic keratosis can be accomplished by freezing, burning, or surgery. If any spot bleeds, scabs, or grows rapidly, please return to have it checked, as these can be an indication of a skin cancer.    If You Need Anything After Your Visit  If you have any questions or concerns for your doctor, please call our main line at [phone] and press option 4 to reach your doctor's medical assistant. If no one answers, please leave a voicemail as directed and we will return your call as soon as possible. Messages left after 4 pm will be answered the following business day.   You may also send [place] a message via [place]. We typically respond to MyChart messages within 1-2 business  days.  For prescription refills, please ask your pharmacy to contact our office. Our fax number is [phone].  If you have an urgent issue when the clinic is closed that cannot wait until the next business day, you can page your doctor at the number below.    Please note that while we do our best to be available for urgent issues outside of office hours, we are not available 24/7.   If you have an urgent issue and are unable to reach [place], you may choose to seek medical care at your doctor's office, retail clinic, urgent care center, or emergency room.  If you have a medical emergency, please immediately call 911 or go to the emergency department.  Pager Numbers  - Dr. Nehemiah Massed: [phone]  - Dr. Laurence Ferrari: [phone]  - Dr. Nicole Kindred: [phone]  In the event of inclement weather, please call our main line at [phone] for an update on the status of any delays or closures.  Dermatology Medication Tips: Please keep the boxes that topical medications come in in order to help keep track of the instructions about where and how to use these. Pharmacies typically print the medication instructions only on the boxes and not directly on the medication tubes.   If your medication is too expensive, please contact our office at [phone] option 4 or send [place] a message through [place].   We are unable to tell what your co-pay for medications will be in  advance as this is different depending on your insurance coverage. However, we may be able to find a substitute medication at lower cost or fill out paperwork to get insurance to cover a needed medication.   If a prior authorization is required to get your medication covered by your insurance company, please allow [place] 1-2 business days to complete this process.  Drug prices often vary depending on where the prescription is filled and some pharmacies may offer cheaper prices.  The website www.goodrx.com contains coupons for medications through  different pharmacies. The prices here do not account for what the cost may be with help from insurance (it may be cheaper with your insurance), but the website can give you the price if you did not use any insurance.  - You can print the associated coupon and take it with your prescription to the pharmacy.  - You may also stop by our office during regular business hours and pick up a GoodRx coupon card.  - If you need your prescription sent electronically to a different pharmacy, notify our office through [facility] or by phone at [phone] option 4.     Si Usted Necesita Algo Despus de Su Visita  Tambin puede enviarnos un mensaje a travs de Pharmacist, community. Por lo general respondemos a los mensajes de MyChart en el transcurso de 1 a 2 das hbiles.  Para renovar recetas, por favor pida a su farmacia que se ponga en contacto con nuestra oficina. Harland Dingwall de fax es [place] [phone].  Si tiene un asunto urgente cuando la clnica est cerrada y que no puede esperar hasta el siguiente da hbil, puede llamar/localizar a su doctor(a) al nmero que aparece a continuacin.   Por favor, tenga en cuenta que aunque hacemos todo lo posible para estar disponibles para asuntos urgentes fuera del horario de [place], no estamos disponibles las 24 horas del da, los 7 das de la [place].   Si tiene un problema urgente y no puede comunicarse con nosotros, puede optar por buscar atencin mdica  en el consultorio de su doctor(a), en una clnica privada, en un centro de atencin urgente o en una sala de emergencias.  Si tiene Engineering geologist, por favor llame inmediatamente al 911 o vaya a la sala de emergencias.  Nmeros de bper  - Dr. Nehemiah Massed: [phone]  - Dra. Moye: [phone]  - Dra. Nicole Kindred: [phone]  En caso de inclemencias del [place], por favor llame a Johnsie Kindred principal al [phone] para una actualizacin sobre el [place] de cualquier retraso o cierre.  Consejos  para la medicacin en dermatologa: Por favor, guarde las cajas en las que vienen los medicamentos de uso tpico para ayudarle a seguir las instrucciones sobre dnde y cmo usarlos. Las farmacias generalmente imprimen las instrucciones del medicamento slo en las cajas y no directamente en los tubos del [place].   Si su medicamento es muy caro, por favor, pngase en contacto con Zigmund Daniel llamando al [phone] y presione la opcin 4 o envenos un mensaje a travs de Pharmacist, community.   No podemos decirle cul ser su copago por los medicamentos por adelantado ya que esto es diferente dependiendo de la cobertura de su seguro. Sin embargo, es posible que podamos encontrar un medicamento sustituto a Electrical engineer un formulario para que el seguro cubra el medicamento que se considera necesario.   Si se requiere una autorizacin previa para que su compaa de seguros [place] su medicamento, por favor permtanos de 1 a 2  das hbiles para completar [place].  Los precios de los medicamentos varan con frecuencia dependiendo del Environmental consultant de dnde se surte la receta y alguna farmacias pueden ofrecer precios ms baratos.  El sitio web www.goodrx.com tiene cupones para medicamentos de Airline pilot. Los precios aqu no tienen en cuenta lo que podra costar con la ayuda del seguro (puede ser ms barato con su seguro), pero el sitio web puede darle el precio si no utiliz Research scientist (physical sciences).  - Puede imprimir el cupn correspondiente y llevarlo con su receta a la farmacia.  - Tambin puede pasar por nuestra oficina durante el horario de atencin regular y Charity fundraiser una tarjeta de cupones de GoodRx.  - Si necesita que su receta se enve electrnicamente a una farmacia diferente, informe a nuestra oficina a travs de MyChart de [place] o por telfono llamando al [phone] y presione la opcin 4.

## 2021-11-30 NOTE — Progress Notes (Signed)
   New Patient Visit  Subjective  PAT S Erin Middleton is a 76 y.o. female who presents for the following: check spots (Arms, legs, >38m. New patient referral from Dr. RMiguel Aschoff  The following portions of the chart were reviewed this encounter and updated as appropriate:   Tobacco  Allergies  Meds  Problems  Med Hx  Surg Hx  Fam Hx     Review of Systems:  No other skin or systemic complaints except as noted in HPI or Assessment and Plan.  Objective  Well appearing patient in no apparent distress; mood and affect are within normal limits.  A focused examination was performed including arms, legs. Relevant physical exam findings are noted in the Assessment and Plan.  L forearm x 2 (2) Stuck on waxy paps with erythema  R and L forearm 0.5cm superficial crusted pap R forearm, L forearm   Assessment & Plan   Seborrheic Keratoses - Stuck-on, waxy, tan-brown papules and/or plaques  - Benign-appearing - Discussed benign etiology and prognosis. - Observe - Call for any changes  Inflamed seborrheic keratosis (2) L forearm x 2 Symptomatic, irritating, patient would like treated.  Destruction of lesion - L forearm x 2 Complexity: simple   Destruction method: cryotherapy   Informed consent: discussed and consent obtained   Timeout:  patient name, date of birth, surgical site, and procedure verified Lesion destroyed using liquid nitrogen: Yes   Region frozen until ice ball extended beyond lesion: Yes   Outcome: patient tolerated procedure well with no complications   Post-procedure details: wound care instructions given    Rash R and L forearm 0.5cm superficial crusted pap R forearm, L forearm Hx of spots may represent ISK vs another rash we are not seeing today Recommend observation and re-evaluate on f/u  Return in about 3 months (around 03/02/2022) for recheck ISKs, Rash.  I, SOthelia Pulling RMA, am acting as scribe for DSarina Ser MD . Documentation: I have reviewed  the above documentation for accuracy and completeness, and I agree with the above.  DSarina Ser MD

## 2021-12-04 ENCOUNTER — Encounter: Payer: Self-pay | Admitting: Dermatology

## 2021-12-07 IMAGING — CT CT HEAD W/O CM
3 series · 15 of 46 positions shown, 18 images · non-contrast
Comparison: None.

CLINICAL DATA: Recurrent syncope.

EXAM:
CT HEAD WITHOUT CONTRAST
TECHNIQUE: Contiguous axial images were obtained from the base of the skull
through the vertex without intravenous contrast.

[Series 2: head wo · axial · 0.42mm/px · z∈[-150,-30]mm · 9 of 29 slices shown, 12 images]
[im 3/29  brain]
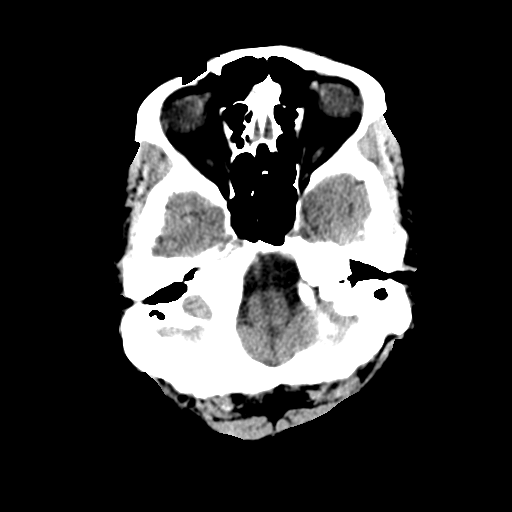
[im 3/29  bone]
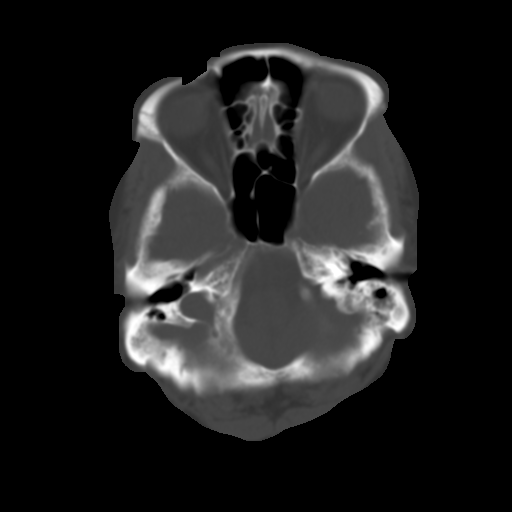
[im 6/29  brain]
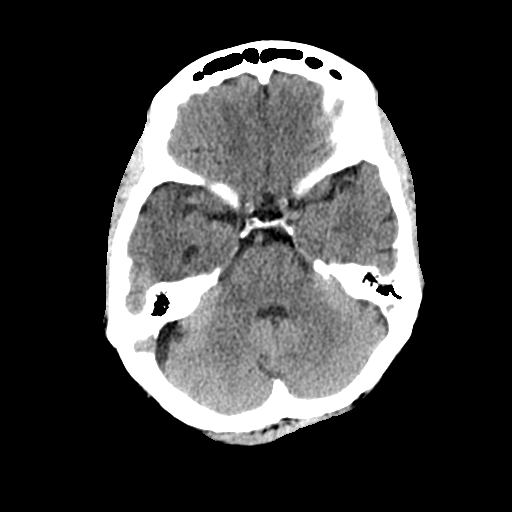
[im 9/29  brain]
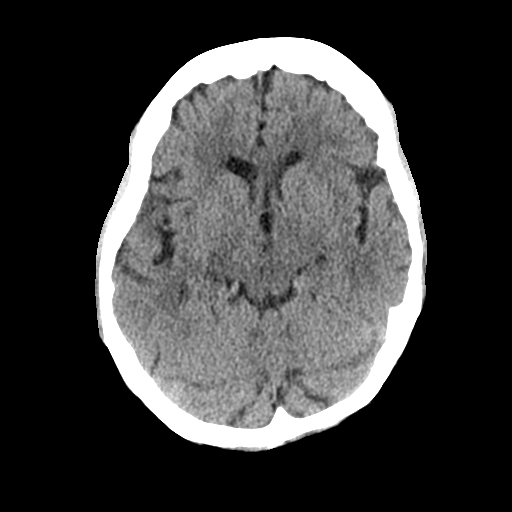
[im 12/29  brain]
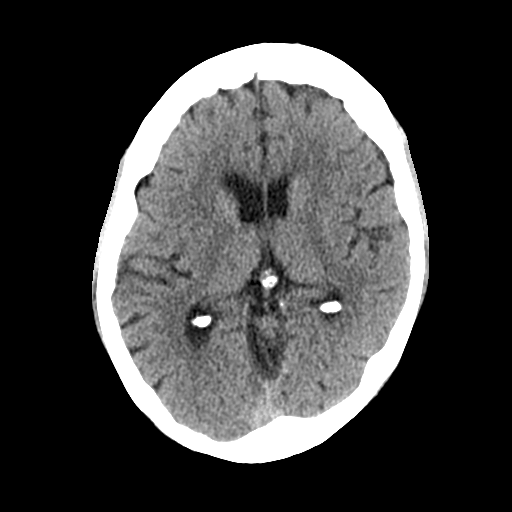
[im 15/29  brain]
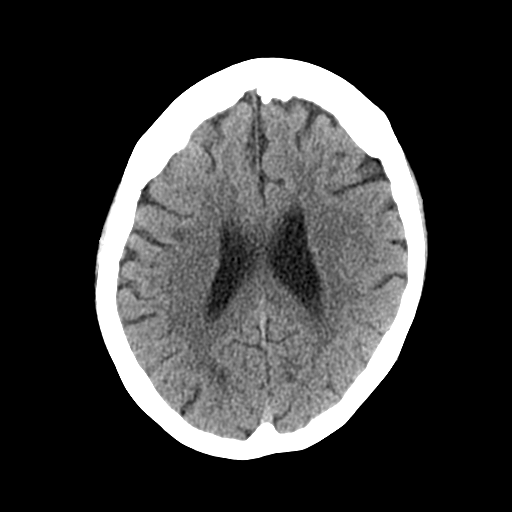
[im 15/29  bone]
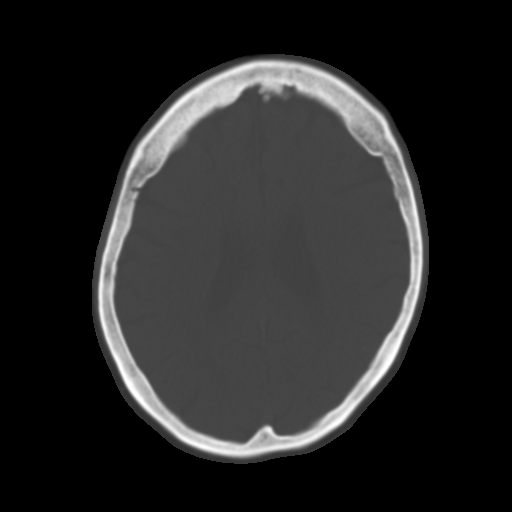
[im 18/29  brain]
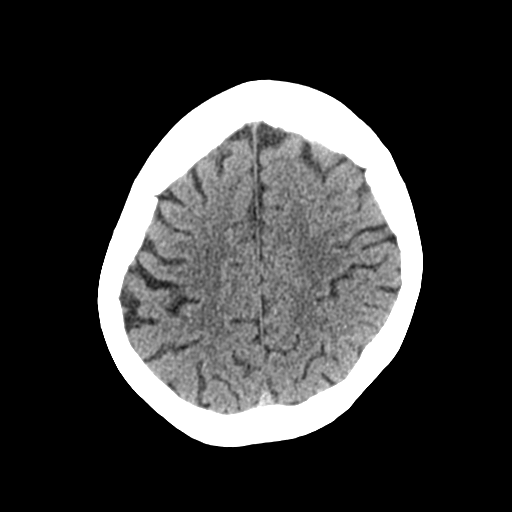
[im 21/29  brain]
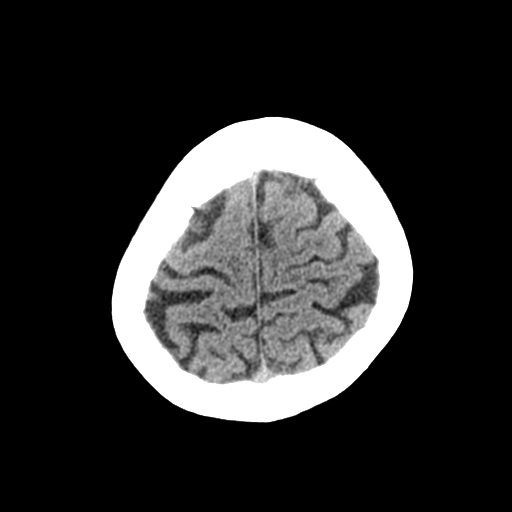
[im 24/29  brain]
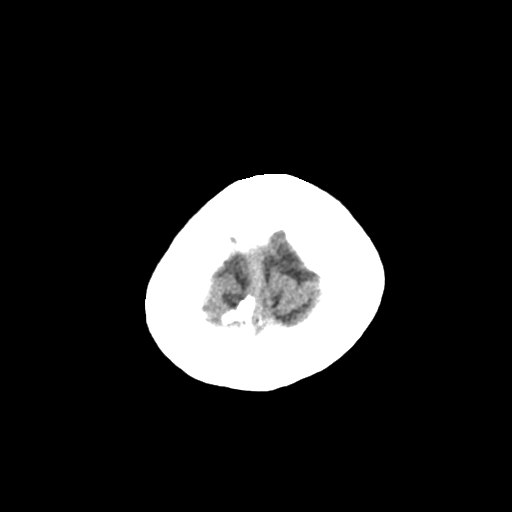
[im 27/29  brain]
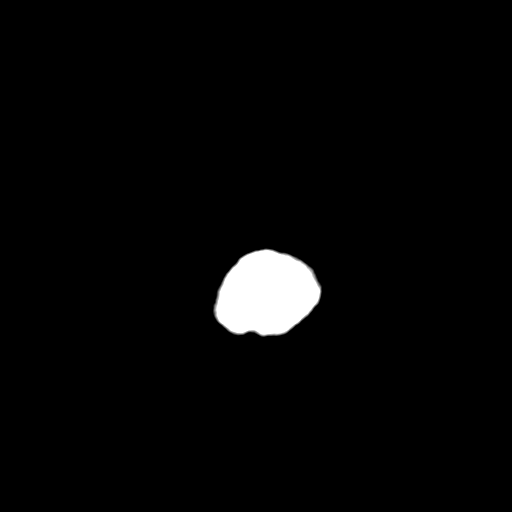
[im 27/29  bone]
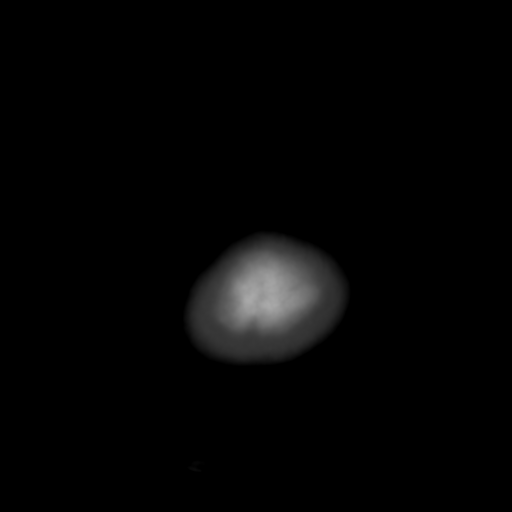

[Series 4: coronal soft tissue · coronal · 0.29mm/px · 3 of 62 slices shown]
[im 21/62  brain]
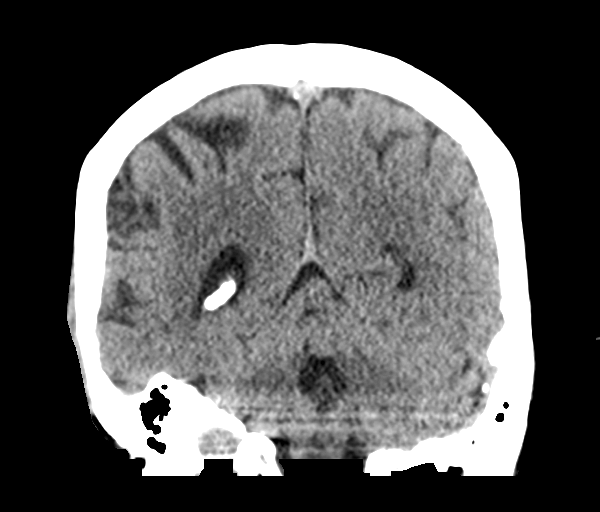
[im 28/62  brain]
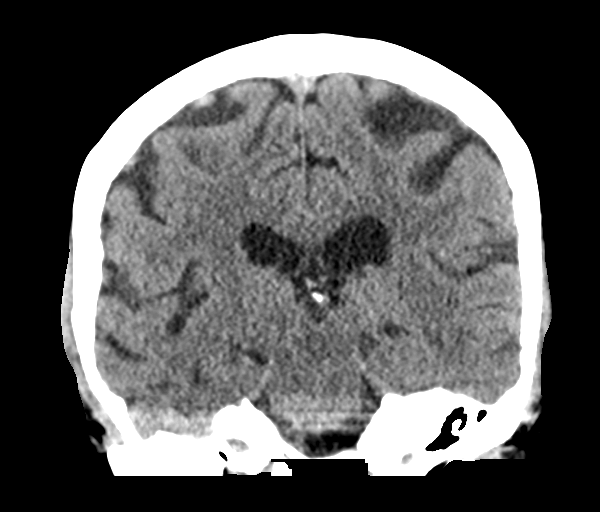
[im 34/62  brain]
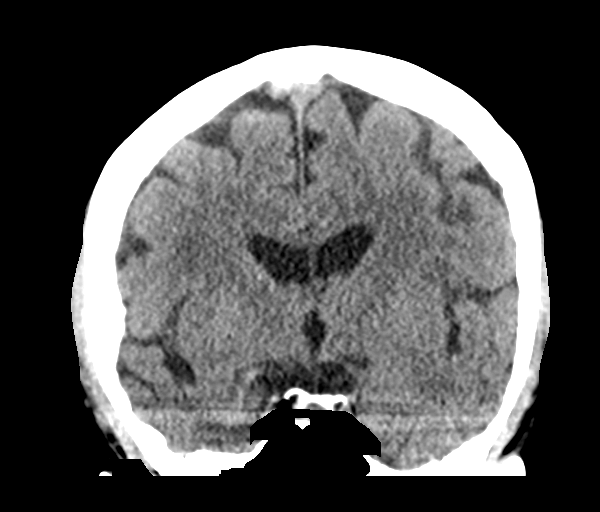

[Series 5: sagittal soft tissue · sagittal · 0.29mm/px · 3 of 50 slices shown]
[im 17/50  brain]
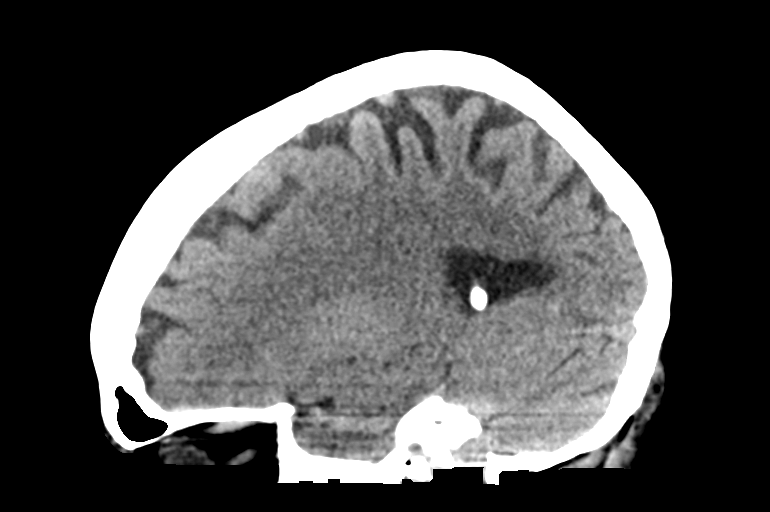
[im 25/50  brain]
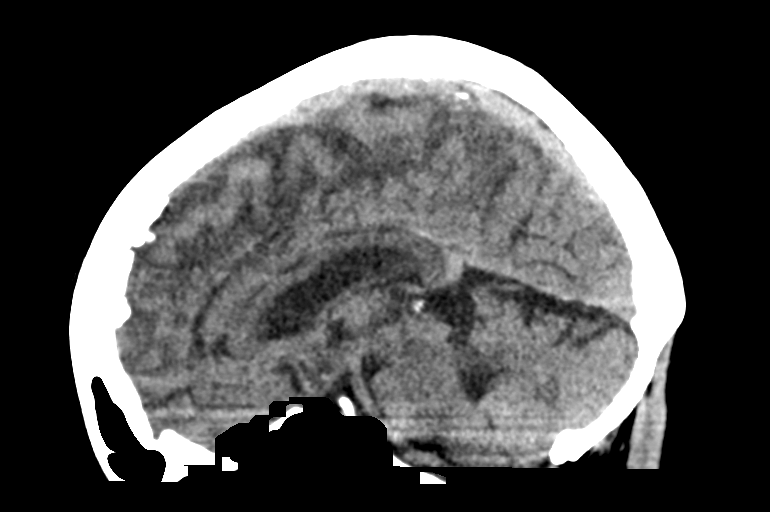
[im 33/50  brain]
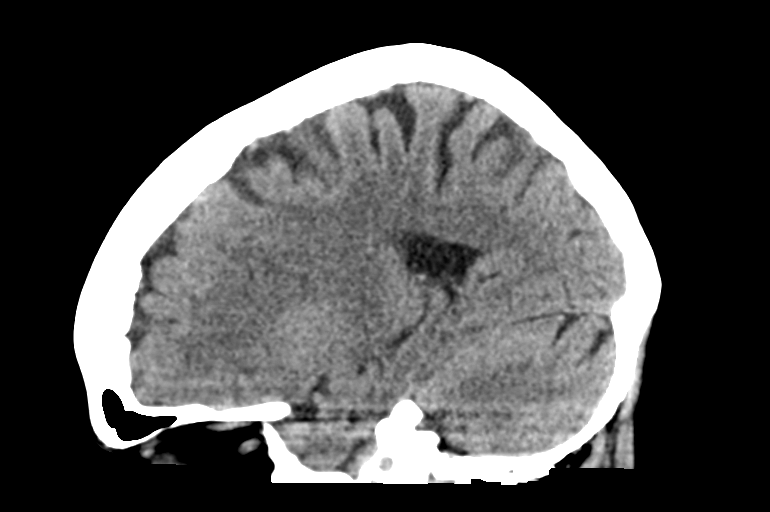

[15 of 46 positions shown; findings below may reference images not displayed]

FINDINGS: Brain: No evidence of acute infarction, hemorrhage, hydrocephalus,
extra-axial collection or mass lesion/mass effect.

Vascular: Negative for hyperdense vessel

Skull: Negative

Sinuses/Orbits: Negative

Other: None
IMPRESSION: Negative CT head

## 2021-12-14 ENCOUNTER — Encounter: Payer: Self-pay | Admitting: Family Medicine

## 2021-12-14 ENCOUNTER — Ambulatory Visit: Payer: Medicare PPO | Admitting: Family Medicine

## 2021-12-14 VITALS — BP 104/69 | HR 87 | Resp 16 | Wt 162.0 lb

## 2021-12-14 DIAGNOSIS — I10 Essential (primary) hypertension: Secondary | ICD-10-CM

## 2021-12-14 DIAGNOSIS — Z853 Personal history of malignant neoplasm of breast: Secondary | ICD-10-CM

## 2021-12-14 DIAGNOSIS — G4733 Obstructive sleep apnea (adult) (pediatric): Secondary | ICD-10-CM | POA: Diagnosis not present

## 2021-12-14 DIAGNOSIS — N3001 Acute cystitis with hematuria: Secondary | ICD-10-CM | POA: Diagnosis not present

## 2021-12-14 DIAGNOSIS — Z9989 Dependence on other enabling machines and devices: Secondary | ICD-10-CM | POA: Diagnosis not present

## 2021-12-14 DIAGNOSIS — E119 Type 2 diabetes mellitus without complications: Secondary | ICD-10-CM | POA: Diagnosis not present

## 2021-12-14 LAB — POCT URINALYSIS DIPSTICK
Bilirubin, UA: NEGATIVE
Blood, UA: POSITIVE
Glucose, UA: NEGATIVE
Ketones, UA: NEGATIVE
Nitrite, UA: NEGATIVE
Protein, UA: POSITIVE — AB
Spec Grav, UA: 1.01 (ref 1.010–1.025)
Urobilinogen, UA: 0.2 E.U./dL
pH, UA: 6 (ref 5.0–8.0)

## 2021-12-14 MED ORDER — NITROFURANTOIN MONOHYD MACRO 100 MG PO CAPS: 100.0000 mg | ORAL_CAPSULE | Freq: Two times a day (BID) | ORAL | 1 refills | Status: DC

## 2021-12-14 NOTE — Patient Instructions (Signed)
Push fluids.  Drink a glass of cranberry juice daily until symptoms have resolved. For the burning symptoms with the UTI try Azo.

## 2021-12-14 NOTE — Progress Notes (Signed)
Established patient visit  I,April Miller,acting as a scribe for Wilhemena Durie, MD.,have documented all relevant documentation on the behalf of Catherine Cubero, MD,as directed by  Wilhemena Durie, MD while in the presence of Wilhemena Durie, MD.   Patient: Erin Middleton   DOB: 1946/03/12   76 y.o. Female  MRN: [id number] Visit Date: 12/14/2021  Today's healthcare provider: Wilhemena Durie, MD   Chief Complaint  Patient presents with   Dysuria   Subjective    Dysuria  This is a new problem. The current episode started in the past 7 days (3 days). The problem has been gradually worsening. The quality of the pain is described as aching, burning, shooting and stabbing. The pain is at a severity of 7/10. The pain is moderate. There has been no fever. Associated symptoms include flank pain, frequency, hesitancy and urgency. Pertinent negatives include no chills, discharge, hematuria, nausea, possible pregnancy, sweats or vomiting. She has tried increased fluids for the symptoms. The treatment provided no relief.    Patient has had burning and vaginal pressure for 3 days. Also has symptoms of flank pain, frequency, urgency, and hesitancy.   Medications: Outpatient Medications Prior to Visit  Medication Sig   ACCU-CHEK GUIDE test strip CHECK BLOOD SUGAR EVERY DAY   Accu-Chek Softclix Lancets lancets CHECK BLOOD SUGAR ONCE DAILY   amLODipine (NORVASC) 5 MG tablet TAKE 1 TABLET BY MOUTH EVERY DAY IN THE EVENING   blood glucose meter kit and supplies Dispense based on patient and insurance preference. Check blood sugar once a day (FOR ICD-10 E10.9, E11.9).   cholecalciferol (VITAMIN D3) 25 MCG (1000 UNIT) tablet Take 1,000 Units by mouth daily.   hydrochlorothiazide (MICROZIDE) 12.5 MG capsule TAKE 1 CAPSULE BY MOUTH EVERY DAY   metFORMIN (GLUCOPHAGE) 500 MG tablet Take 1 tablet (500 mg total) by mouth 2 (two) times daily with a meal.   Multiple Vitamins-Minerals  (PRESERVISION/LUTEIN) CAPS Take 1 capsule by mouth at bedtime.   omeprazole (PRILOSEC) 20 MG capsule TAKE 1 CAPSULE BY MOUTH EVERY DAY IN THE MORNING   rosuvastatin (CRESTOR) 10 MG tablet TAKE 1 TABLET BY MOUTH EVERY DAY   telmisartan (MICARDIS) 80 MG tablet TAKE 1 TABLET BY MOUTH EVERY DAY   vitamin E 400 UNIT capsule Take 400 Units by mouth daily. Reported on 09/15/2015   No facility-administered medications prior to visit.    Review of Systems  Constitutional:  Negative for chills.  Gastrointestinal:  Negative for nausea and vomiting.  Genitourinary:  Positive for dysuria, flank pain, frequency, hesitancy and urgency. Negative for hematuria.    Last lipids Lab Results  Component Value Date   CHOL 112 05/30/2021   HDL 60 05/30/2021   LDLCALC 38 05/30/2021   TRIG 67 05/30/2021   CHOLHDL 1.9 05/30/2021       Objective    BP 104/69 (BP Location: Right Arm, Patient Position: Sitting, Cuff Size: Normal)   Pulse 87   Resp 16   Wt 162 lb (73.5 kg)   SpO2 96%   BMI 28.70 kg/m  BP Readings from Last 3 Encounters:  12/14/21 104/69  10/27/21 116/69  05/30/21 140/86   Wt Readings from Last 3 Encounters:  12/14/21 162 lb (73.5 kg)  10/27/21 165 lb (74.8 kg)  05/30/21 164 lb (74.4 kg)      Physical Exam Vitals reviewed.  Constitutional:      General: She is not in acute distress.    Appearance:  She is well-developed.  HENT:     Head: Normocephalic and atraumatic.     Right Ear: Hearing normal.     Left Ear: Hearing normal.     Nose: Nose normal.  Eyes:     General: Lids are normal. No scleral icterus.       Right eye: No discharge.        Left eye: No discharge.     Conjunctiva/sclera: Conjunctivae normal.  Cardiovascular:     Rate and Rhythm: Normal rate and regular rhythm.     Heart sounds: Normal heart sounds.  Pulmonary:     Effort: Pulmonary effort is normal. No respiratory distress.  Abdominal:     Palpations: Abdomen is soft.     Tenderness: There is no  right CVA tenderness or left CVA tenderness.  Skin:    Findings: No lesion or rash.  Neurological:     General: No focal deficit present.     Mental Status: She is alert and oriented to person, place, and time.  Psychiatric:        Mood and Affect: Mood normal.        Speech: Speech normal.        Behavior: Behavior normal.        Thought Content: Thought content normal.        Judgment: Judgment normal.       Results for orders placed or performed in visit on 12/14/21  POCT urinalysis dipstick  Result Value Ref Range   Color, UA Yellow    Clarity, UA Cloudy    Glucose, UA Negative Negative   Bilirubin, UA Negative    Ketones, UA Negative    Spec Grav, UA 1.010 1.010 - 1.025   Blood, UA Positive    pH, UA 6.0 5.0 - 8.0   Protein, UA Positive (A) Negative   Urobilinogen, UA 0.2 0.2 or 1.0 E.U./dL   Nitrite, UA Negative    Leukocytes, UA Large (3+) (A) Negative    Assessment & Plan     1. Acute cystitis with hematuria Push fluids try cranberry juice daily and use Azo for dysuria.  Treat with Macrobid. - POCT urinalysis dipstick - CULTURE, URINE COMPREHENSIVE  2. Controlled type 2 diabetes mellitus without complication, without long-term current use of insulin ([place])   3. OSA on CPAP   4. Essential hypertension   5. History of breast cancer    No follow-ups on file.      I, Wilhemena Durie, MD, have reviewed all documentation for this visit. The documentation on 12/20/21 for the exam, diagnosis, procedures, and orders are all accurate and complete.    Nasiyah Laverdiere Cranford Mon, MD  [facility] [phone] (phone) [phone] (fax)  [place]

## 2021-12-21 LAB — CULTURE, URINE COMPREHENSIVE

## 2022-01-25 DIAGNOSIS — Q231 Congenital insufficiency of aortic valve: Secondary | ICD-10-CM | POA: Diagnosis not present

## 2022-01-25 DIAGNOSIS — I442 Atrioventricular block, complete: Secondary | ICD-10-CM | POA: Diagnosis not present

## 2022-01-25 DIAGNOSIS — Z9989 Dependence on other enabling machines and devices: Secondary | ICD-10-CM | POA: Diagnosis not present

## 2022-01-25 DIAGNOSIS — I10 Essential (primary) hypertension: Secondary | ICD-10-CM | POA: Diagnosis not present

## 2022-01-25 DIAGNOSIS — R002 Palpitations: Secondary | ICD-10-CM | POA: Diagnosis not present

## 2022-01-25 DIAGNOSIS — G4733 Obstructive sleep apnea (adult) (pediatric): Secondary | ICD-10-CM | POA: Diagnosis not present

## 2022-02-09 DIAGNOSIS — M1711 Unilateral primary osteoarthritis, right knee: Secondary | ICD-10-CM | POA: Diagnosis not present

## 2022-02-28 ENCOUNTER — Ambulatory Visit: Payer: Self-pay

## 2022-02-28 NOTE — Patient Instructions (Signed)
Visit Information  Thank you for taking time to visit with me today. Please don't hesitate to contact me if I can be of assistance to you.   Following are the goals we discussed today:   Goals Addressed             This Visit's Progress    RNCM: Prevention of another UTI       Care Coordination Interventions: Evaluation of current treatment plan related to urinary health and prevention of UTI and patient's adherence to plan as established by provider Advised patient to call the office for changes in urinary health or sx and sx of new UTI Provided education to patient re: UTI information in my chart after visit summary, also to stay hydrated and monitor for sx and sx of infection Reviewed scheduled/upcoming provider appointments including 06-01-2022 for AWV Discussed plans with patient for ongoing care management follow up and provided patient with direct contact information for care management team Advised patient to discuss changes in urinary health or changes in her chronic conditions with provider Screening for signs and symptoms of depression related to chronic disease state  Assessed social determinant of health barriers Patient interviewed about adult health maintenance status including  heart health and the patient states that her heart health is stable at this time Advised patient to discuss  routine health maintenance  with primary care provider  Provided education about how to reach the [facility] for future needs. The patient feels she is doing well right now and stable. AWV scheduled for November on 2023, review and education provided.              Please call the care guide team at [phone] if you need to schedule an appointment.   If you are experiencing a Mental Health or [place] or need someone to talk to, please call the Suicide and Crisis Lifeline: 988 call the [place] National Suicide Prevention Lifeline: 830-060-3979 or TTY: (704)442-9715 TTY  (973)480-3877) to talk to a trained counselor call 1-800-273-TALK (toll free, 24 hour hotline)  Patient verbalizes understanding of instructions and care plan provided today and agrees to view in [place]. Active MyChart status and patient understanding of how to access instructions and care plan via MyChart confirmed with patient.     No further follow up required: the patient feels she is stable and denies any needs at this time. Information provided on how to reach the [facility] if future needs arise.  Noreene Larsson RN, MSN, CCM Community Care Coordinator [place] [facility] Mobile: [phone]     Urinary Tract Infection, Adult A urinary tract infection (UTI) is an infection of any part of the urinary tract. The urinary tract includes: The kidneys. The ureters. The bladder. The urethra. These organs make, store, and get rid of pee (urine) in the body. What are the causes? This infection is caused by germs (bacteria) in your genital area. These germs grow and cause swelling (inflammation) of your urinary tract. What increases the risk? The following factors may make you more likely to develop this condition: Using a small, thin tube (catheter) to drain pee. Not being able to control when you pee or poop (incontinence). Being female. If you are female, these things can increase the risk: Using these methods to prevent pregnancy: A medicine that kills sperm (spermicide). A device that blocks sperm (diaphragm). Having low levels of a female hormone (estrogen). Being pregnant. You are more likely to develop this condition if: You  have genes that add to your risk. You are sexually active. You take antibiotic medicines. You have trouble peeing because of: A prostate that is bigger than normal, if you are female. A blockage in the part of your body that drains pee from the bladder. A kidney stone. A nerve condition that affects your bladder. Not getting enough to  drink. Not peeing often enough. You have other conditions, such as: Diabetes. A weak disease-fighting system (immune system). Sickle cell disease. Gout. Injury of the spine. What are the signs or symptoms? Symptoms of this condition include: Needing to pee right away. Peeing small amounts often. Pain or burning when peeing. Blood in the pee. Pee that smells bad or not like normal. Trouble peeing. Pee that is cloudy. Fluid coming from the vagina, if you are female. Pain in the belly or lower back. Other symptoms include: Vomiting. Not feeling hungry. Feeling mixed up (confused). This may be the first symptom in older adults. Being tired and grouchy (irritable). A fever. Watery poop (diarrhea). How is this treated? Taking antibiotic medicine. Taking other medicines. Drinking enough water. In some cases, you may need to see a specialist. Follow these instructions at home:  Medicines Take over-the-counter and prescription medicines only as told by your doctor. If you were prescribed an antibiotic medicine, take it as told by your doctor. Do not stop taking it even if you start to feel better. General instructions Make sure you: Pee until your bladder is empty. Do not hold pee for a long time. Empty your bladder after sex. Wipe from front to back after peeing or pooping if you are a female. Use each tissue one time when you wipe. Drink enough fluid to keep your pee pale yellow. Keep all follow-up visits. Contact a doctor if: You do not get better after 1-2 days. Your symptoms go away and then come back. Get help right away if: You have very bad back pain. You have very bad pain in your lower belly. You have a fever. You have chills. You feeling like you will vomit or you vomit. Summary A urinary tract infection (UTI) is an infection of any part of the urinary tract. This condition is caused by germs in your genital area. There are many risk factors for a  UTI. Treatment includes antibiotic medicines. Drink enough fluid to keep your pee pale yellow. This information is not intended to replace advice given to you by your health care provider. Make sure you discuss any questions you have with your health care provider. Document Revised: 01/30/2020 Document Reviewed: 01/30/2020 Elsevier Patient Education  [employer].

## 2022-02-28 NOTE — Patient Outreach (Signed)
  Care Coordination   Initial Visit Note   02/28/2022 Name: Erin Middleton MRN: [id number] DOB: 10/06/1945  Erin Middleton is a 76 y.o. year old female who sees Erin Middleton., MD for primary care. I spoke with  Erin Middleton by phone today.  What matters to the patients health and wellness today?  To not have any more UTI's and maintain her heart health    Goals Addressed             This Visit's Progress    RNCM: Prevention of another UTI       Care Coordination Interventions: Evaluation of current treatment plan related to urinary health and prevention of UTI and patient's adherence to plan as established by provider Advised patient to call the office for changes in urinary health or sx and sx of new UTI Provided education to patient re: UTI information in my chart after visit summary, also to stay hydrated and monitor for sx and sx of infection Reviewed scheduled/upcoming provider appointments including 06-01-2022 for AWV Discussed plans with patient for ongoing care management follow up and provided patient with direct contact information for care management team Advised patient to discuss changes in urinary health or changes in her chronic conditions with provider Screening for signs and symptoms of depression related to chronic disease state  Assessed social determinant of health barriers Patient interviewed about adult health maintenance status including  heart health and the patient states that her heart health is stable at this time Advised patient to discuss  routine health maintenance  with primary care provider  Provided education about how to reach the [facility] for future needs. The patient feels she is doing well right now and stable. AWV scheduled for November on 2023, review and education provided.            SDOH assessments and interventions completed:  Yes  SDOH Interventions Today    Flowsheet Row Most Recent Value  SDOH Interventions   Food  Insecurity Interventions Intervention Not Indicated  Financial Strain Interventions Intervention Not Indicated  Social Connections Interventions Intervention Not Indicated  Transportation Interventions Intervention Not Indicated        Care Coordination Interventions Activated:  Yes  Care Coordination Interventions:  Yes, provided   Follow up plan: No further intervention required.   Encounter Outcome:  Pt. Visit Completed   Noreene Larsson RN, MSN, [place] [facility] Mobile: [phone]

## 2022-03-01 ENCOUNTER — Encounter: Payer: Self-pay | Admitting: Dermatology

## 2022-03-01 ENCOUNTER — Ambulatory Visit: Payer: Medicare PPO | Admitting: Dermatology

## 2022-03-01 DIAGNOSIS — L821 Other seborrheic keratosis: Secondary | ICD-10-CM | POA: Diagnosis not present

## 2022-03-01 DIAGNOSIS — L578 Other skin changes due to chronic exposure to nonionizing radiation: Secondary | ICD-10-CM

## 2022-03-01 DIAGNOSIS — L820 Inflamed seborrheic keratosis: Secondary | ICD-10-CM

## 2022-03-01 NOTE — Patient Instructions (Addendum)

## 2022-03-01 NOTE — Progress Notes (Signed)
   Follow-Up Visit   Subjective  PAT S Zamarron is a 76 y.o. female who presents for the following: ISK f/u (L forearm, LN2 at last visit resolved per pt, pt has a few more today R arm, post neck) and ISK vs rash f/u (R and L forearm, 43mf/u). The patient has spots, moles and lesions to be evaluated, some may be new or changing and the patient has concerns .  The following portions of the chart were reviewed this encounter and updated as appropriate:   Tobacco  Allergies  Meds  Problems  Med Hx  Surg Hx  Fam Hx     Review of Systems:  No other skin or systemic complaints except as noted in HPI or Assessment and Plan.  Objective  Well appearing patient in no apparent distress; mood and affect are within normal limits.  A focused examination was performed including bil arms, post neck. Relevant physical exam findings are noted in the Assessment and Plan.  L forearm x 2, R forearm x 1, R post base of neck x 1 (4) Stuck on waxy paps with erythema, some residual on L forearm   Assessment & Plan   Seborrheic Keratoses - Stuck-on, waxy, tan-brown papules and/or plaques  - Benign-appearing - Discussed benign etiology and prognosis. - Observe - Call for any changes - arms  Inflamed seborrheic keratosis (4) L forearm x 2, R forearm x 1, R post base of neck x 1  Symptomatic, irritating, patient would like treated.   Destruction of lesion - L forearm x 2, R forearm x 1, R post base of neck x 1 Complexity: simple   Destruction method: cryotherapy   Informed consent: discussed and consent obtained   Timeout:  patient name, date of birth, surgical site, and procedure verified Lesion destroyed using liquid nitrogen: Yes   Region frozen until ice ball extended beyond lesion: Yes   Outcome: patient tolerated procedure well with no complications   Post-procedure details: wound care instructions given    Actinic Damage - chronic, secondary to cumulative UV radiation exposure/sun  exposure over time - diffuse scaly erythematous macules with underlying dyspigmentation - Recommend daily broad spectrum sunscreen SPF 30+ to sun-exposed areas, reapply every 2 hours as needed.  - Recommend staying in the shade or wearing long sleeves, sun glasses (UVA+UVB protection) and wide brim hats (4-inch brim around the entire circumference of the hat). - Call for new or changing lesions.  Return if symptoms worsen or fail to improve.  I, SOthelia Pulling RMA, am acting as scribe for DSarina Ser MD . Documentation: I have reviewed the above documentation for accuracy and completeness, and I agree with the above.  DSarina Ser MD

## 2022-03-10 ENCOUNTER — Other Ambulatory Visit: Payer: Self-pay | Admitting: Family Medicine

## 2022-03-10 DIAGNOSIS — E785 Hyperlipidemia, unspecified: Secondary | ICD-10-CM

## 2022-04-05 ENCOUNTER — Other Ambulatory Visit: Payer: Self-pay | Admitting: Family Medicine

## 2022-05-02 ENCOUNTER — Encounter: Payer: Medicare PPO | Admitting: Physician Assistant

## 2022-05-02 ENCOUNTER — Encounter: Payer: Self-pay | Admitting: Physician Assistant

## 2022-05-02 ENCOUNTER — Telehealth: Payer: Self-pay

## 2022-05-02 ENCOUNTER — Ambulatory Visit (INDEPENDENT_AMBULATORY_CARE_PROVIDER_SITE_OTHER): Payer: Medicare PPO | Admitting: Physician Assistant

## 2022-05-02 VITALS — BP 104/57 | HR 77 | Temp 98.1°F | Resp 28 | Wt 156.0 lb

## 2022-05-02 DIAGNOSIS — Z538 Procedure and treatment not carried out for other reasons: Secondary | ICD-10-CM

## 2022-05-02 NOTE — Progress Notes (Unsigned)
     I,Soren Pigman L Shea Kapur,acting as a scribe for [employer], PA-C.,have documented all relevant documentation on the behalf of Mikey Kirschner, PA-C,as directed by  Mikey Kirschner, PA-C while in the presence of Mikey Kirschner, PA-C.   Established patient visit   Patient: Erin Middleton   DOB: January 13, 1946   76 y.o. Female  MRN: [id number] Visit Date: 05/02/2022  Today's healthcare provider: Mikey Kirschner, PA-C   Chief Complaint  Patient presents with   Cough   Subjective    HPI  Cough This is a new problem. Episode onset: 4 days ago. The problem has been gradually improving. The cough is Non-productive. Associated symptoms include headaches, myalgias (in legs), postnasal drip, shortness of breath and sweats. Pertinent negatives include no chest pain, chills or fever. Associated symptoms comments: hoarsness. She has tried nothing for the symptoms.   Home COVID test was negative 3 days ago.  Medications: Outpatient Medications Prior to Visit  Medication Sig   ACCU-CHEK GUIDE test strip CHECK BLOOD SUGAR EVERY DAY   Accu-Chek Softclix Lancets lancets CHECK BLOOD SUGAR ONCE DAILY   amLODipine (NORVASC) 5 MG tablet TAKE 1 TABLET BY MOUTH EVERY DAY IN THE EVENING   blood glucose meter kit and supplies Dispense based on patient and insurance preference. Check blood sugar once a day (FOR ICD-10 E10.9, E11.9).   cholecalciferol (VITAMIN D3) 25 MCG (1000 UNIT) tablet Take 1,000 Units by mouth daily.   hydrochlorothiazide (MICROZIDE) 12.5 MG capsule TAKE 1 CAPSULE BY MOUTH EVERY DAY   metFORMIN (GLUCOPHAGE) 500 MG tablet Take 1 tablet (500 mg total) by mouth 2 (two) times daily with a meal.   Multiple Vitamins-Minerals (PRESERVISION/LUTEIN) CAPS Take 1 capsule by mouth at bedtime.   omeprazole (PRILOSEC) 20 MG capsule TAKE 1 CAPSULE BY MOUTH EVERY DAY IN THE MORNING   rosuvastatin (CRESTOR) 10 MG tablet TAKE 1 TABLET BY MOUTH EVERY DAY   telmisartan (MICARDIS) 80 MG tablet TAKE 1 TABLET BY  MOUTH EVERY DAY   vitamin E 400 UNIT capsule Take 400 Units by mouth daily. Reported on 09/15/2015   [DISCONTINUED] nitrofurantoin, macrocrystal-monohydrate, (MACROBID) 100 MG capsule Take 1 capsule (100 mg total) by mouth 2 (two) times daily. (Patient not taking: Reported on 05/02/2022)   No facility-administered medications prior to visit.    Review of Systems  {Labs  Heme  Chem  Endocrine  Serology  Results Review (optional):23779}   Objective    BP (!) 104/57 (BP Location: Right Arm, Patient Position: Sitting, Cuff Size: Normal)   Pulse 77   Temp 98.1 F (36.7 C) (Oral)   Resp (!) 28   Wt 156 lb (70.8 kg)   SpO2 99% Comment: room air  BMI 27.63 kg/m  {Show previous vital signs (optional):23777}  Physical Exam  ***  No results found for any visits on 05/02/22.  Assessment & Plan     ***  No follow-ups on file.      {provider attestation***:1}   Mikey Kirschner, PA-C  [facility] [phone] (phone) [phone] (fax)  [place]

## 2022-05-02 NOTE — Telephone Encounter (Signed)
Copied from [place] [phone]. Topic: Appointment Scheduling - Scheduling Inquiry for Clinic >> May 02, 2022  1:18 PM Everette C wrote: Reason for CRM: The patient's daughter would like to speak directly with a member of administrative staff when possible (prefers Judson Roch)   The patient's daughter would like to be seen today for an office visit and has stressed the urgency of their request for contact today 05/02/22  Please contact further when available

## 2022-05-02 NOTE — Progress Notes (Signed)
This encounter was created in error - please disregard.

## 2022-05-03 ENCOUNTER — Telehealth: Payer: Self-pay | Admitting: Internal Medicine

## 2022-05-03 NOTE — Telephone Encounter (Signed)
Spoke to patient and relayed below message/recommendations.  No availability in [place] office. She stated that she would go to urgent care.  Nothing further needed.

## 2022-05-03 NOTE — Progress Notes (Signed)
Appt was cancelled.  

## 2022-05-03 NOTE — Telephone Encounter (Signed)
Called and spoke to patient.  C/o dry cough, hoarseness, increased SOB with exertion, bilateral leg weakness and sweats x7d Denied fever, chills, wheezing or additional sx.  Negative home covid test last week. She does not use inhalers. Not taking any OTC meds to help with sx. She does not wear supplemental oxygen. Not monitoring oxygen levels.   Dr. Mortimer Fries, please advise. Thanks

## 2022-05-22 ENCOUNTER — Encounter (INDEPENDENT_AMBULATORY_CARE_PROVIDER_SITE_OTHER): Payer: Medicare PPO | Admitting: Ophthalmology

## 2022-05-22 DIAGNOSIS — I10 Essential (primary) hypertension: Secondary | ICD-10-CM

## 2022-05-22 DIAGNOSIS — H353122 Nonexudative age-related macular degeneration, left eye, intermediate dry stage: Secondary | ICD-10-CM

## 2022-05-22 DIAGNOSIS — H348312 Tributary (branch) retinal vein occlusion, right eye, stable: Secondary | ICD-10-CM

## 2022-05-22 DIAGNOSIS — H353114 Nonexudative age-related macular degeneration, right eye, advanced atrophic with subfoveal involvement: Secondary | ICD-10-CM

## 2022-05-22 DIAGNOSIS — H35033 Hypertensive retinopathy, bilateral: Secondary | ICD-10-CM

## 2022-05-22 DIAGNOSIS — H43813 Vitreous degeneration, bilateral: Secondary | ICD-10-CM

## 2022-05-23 ENCOUNTER — Ambulatory Visit: Payer: Medicare PPO | Admitting: Internal Medicine

## 2022-06-01 ENCOUNTER — Encounter: Payer: Medicare PPO | Admitting: Physician Assistant

## 2022-06-01 ENCOUNTER — Encounter: Payer: Medicare PPO | Admitting: Family Medicine

## 2022-06-02 ENCOUNTER — Ambulatory Visit: Payer: Medicare PPO | Admitting: Internal Medicine

## 2022-06-02 ENCOUNTER — Encounter: Payer: Self-pay | Admitting: Internal Medicine

## 2022-06-02 VITALS — BP 120/60 | HR 67 | Temp 97.9°F | Ht 63.0 in | Wt 157.0 lb

## 2022-06-02 DIAGNOSIS — R1310 Dysphagia, unspecified: Secondary | ICD-10-CM | POA: Diagnosis not present

## 2022-06-02 DIAGNOSIS — G4733 Obstructive sleep apnea (adult) (pediatric): Secondary | ICD-10-CM | POA: Diagnosis not present

## 2022-06-02 NOTE — Patient Instructions (Addendum)
Continue CPAP as prescribed Excellent job A!  Patient requesting ENT referral for trouble swallowing and for thyroid check

## 2022-06-02 NOTE — Progress Notes (Signed)
$'@Patient'l$  ID: Erin Middleton, female    DOB: 11-21-1945, 76 y.o.   MRN: [id number]   TESTS/DOWNLOADS Patient's home sleep study which was done in May/2020 showed moderate obstructive sleep apnea with an AHI of 22.6, SaO2 low 66%  10/2020 Compliance report 97% for days 9% for greater than 4 hours AHI reduced to 1.2 previous AHI of 22  CPAP DL 04/2021 Excellent compliance report 90% days and 83%>4 hrs AHI 1.2    CC FOLLOW UP OSA   HPI:  Compliance report reviewed in detail with patient 93% for days 83% for greater than 4 hours AHI reduced to 1.4 Auto CPAP 5 to 20 cm water pressure    No exacerbation at this time No evidence of heart failure at this time No evidence or signs of infection at this time No respiratory distress No fevers, chills, nausea, vomiting, diarrhea No evidence of lower extremity edema No evidence hemoptysis  Recent URI Complete the antibiotics Feels better Still feels weak  Patient requesting ENT referral Has history of thyroid problems and like to see an ear nose and throat doctor Has a history of dysphagia  CBC    Component Value Date/Time   WBC 8.9 01/17/2021 1603   WBC 7.4 12/26/2020 1102   RBC 4.24 01/17/2021 1603   RBC 4.11 12/26/2020 1102   HGB 13.4 01/17/2021 1603   HCT 40.4 01/17/2021 1603   PLT 231 01/17/2021 1603   MCV 95 01/17/2021 1603   MCH 31.6 01/17/2021 1603   MCH 32.1 12/26/2020 1102   MCHC 33.2 01/17/2021 1603   MCHC 34.7 12/26/2020 1102   RDW 12.8 01/17/2021 1603   LYMPHSABS 2.5 01/17/2021 1603   MONOABS 0.4 12/26/2020 1102   EOSABS 0.4 01/17/2021 1603   BASOSABS 0.0 01/17/2021 1603    BMET    Component Value Date/Time   NA 141 10/27/2021 0935   K 4.2 10/27/2021 0935   CL 102 10/27/2021 0935   CO2 22 10/27/2021 0935   GLUCOSE 201 (H) 10/27/2021 0935   GLUCOSE 136 (H) 12/28/2020 0528   BUN 19 10/27/2021 0935   CREATININE 1.03 (H) 10/27/2021 0935   CREATININE 0.94 (H) 06/06/2017 1120   CALCIUM 9.3  10/27/2021 0935   GFRNONAA >60 12/28/2020 0528   GFRNONAA 60 06/06/2017 1120   GFRAA 66 08/03/2020 1120   GFRAA 70 06/06/2017 1120    BNP    Component Value Date/Time   BNP 81.0 09/19/2018 1120      No Known Allergies  Immunization History  Administered Date(s) Administered   Fluad Quad(high Dose 65+) 04/15/2019, 05/19/2020, 05/04/2021   Influenza, High Dose Seasonal PF 05/10/2016, 06/06/2017, 03/07/2018   Influenza-Unspecified 04/09/2015   PFIZER(Purple Top)SARS-COV-2 Vaccination 07/26/2019, 08/18/2019, 06/01/2020   Pneumococcal Conjugate-13 05/10/2016   Pneumococcal Polysaccharide-23 03/06/2012   Td 01/23/2003    Past Medical History:  Diagnosis Date   Arthritis    Breast cancer ([place]) 1985   left breast cancer - chemotherapy   Cancer ([place])    Breast   GERD (gastroesophageal reflux disease)    Headache    migraines h/o   Hypertension    Personal history of chemotherapy     Review of Systems  Review of Systems  Constitutional:  Positive for fatigue. Negative for activity change and fever.  HENT:  Negative for sinus pressure, sinus pain and sore throat.   Respiratory:  Positive for shortness of breath. Negative for cough and wheezing.   Cardiovascular:  Negative for chest pain and palpitations.  Gastrointestinal:  Negative for diarrhea, nausea and vomiting.  Musculoskeletal:  Negative for arthralgias.  Neurological:  Positive for syncope. Negative for dizziness.  Psychiatric/Behavioral:  Negative for sleep disturbance. The patient is not nervous/anxious.      BP 120/60 (BP Location: Left Arm, Cuff Size: Normal)   Pulse 67   Temp 97.9 F (36.6 C) (Temporal)   Ht '5\' 3"'$  (1.6 m)   Wt 157 lb (71.2 kg)   SpO2 99%   BMI 27.81 kg/m       Review of Systems: Gen:  Denies  fever, sweats, chills weight loss  HEENT: Denies blurred vision, double vision, ear pain, eye pain, hearing loss, nose bleeds, sore throat Cardiac:  No dizziness, chest pain or heaviness,  chest tightness,edema, No JVD Resp:   No cough, -sputum production, -shortness of breath,-wheezing, -hemoptysis,  Other:  All other systems negative    Physical Examination:   General Appearance: No distress  EYES PERRLA, EOM intact.   NECK Supple, No JVD Pulmonary: normal breath sounds, No wheezing.  CardiovascularNormal S1,S2.  No m/r/g.   Abdomen: Benign, Soft, non-tender. ALL OTHER ROS ARE NEGATIVE     Assessment & Plan:   76 year old pleasant white female seen today for follow-up assessment for OSA S/p pacemaker placement AUG 2022   Excellent compliance with underlying obstructive sleep apnea Continue CPAP as prescribed Patient benefits from CPAP therapy  Diabetes Mellitis - Sleep apnea can contribute to DM, therefore treatment of sleep apnea is important part of DM management.   PAH (pulmonary artery hypertension) (HCC) Previously seen on prior echocardiograms Most likely related to sleep apnea Continue therapy as prescribed   NO SIGN OF INFECTION AT THIS TIME   Patient requesting ENT referral for trouble swallowing and for thyroid check  Patient  satisfied with Plan of action and management. All questions answered Follow-up in 1 year  Total Time spent 21 minutes  Maretta Bees Kerissa Pesa, M.D.  [facility]  Medical Director [place] Director [facility] Cardio-Pulmonary Department

## 2022-08-14 ENCOUNTER — Other Ambulatory Visit: Payer: Self-pay | Admitting: Family Medicine

## 2022-08-14 DIAGNOSIS — Z1231 Encounter for screening mammogram for malignant neoplasm of breast: Secondary | ICD-10-CM

## 2022-09-11 ENCOUNTER — Ambulatory Visit
Admission: RE | Admit: 2022-09-11 | Discharge: 2022-09-11 | Disposition: A | Discharge status: Home / Self Care | Payer: Medicare PPO | Source: Ambulatory Visit | Attending: Family Medicine | Admitting: Family Medicine

## 2022-09-11 ENCOUNTER — Other Ambulatory Visit: Payer: Self-pay | Admitting: Family Medicine

## 2022-09-11 DIAGNOSIS — Z1231 Encounter for screening mammogram for malignant neoplasm of breast: Secondary | ICD-10-CM | POA: Insufficient documentation

## 2022-11-20 ENCOUNTER — Encounter (INDEPENDENT_AMBULATORY_CARE_PROVIDER_SITE_OTHER): Payer: Medicare PPO | Admitting: Ophthalmology

## 2022-11-20 DIAGNOSIS — I10 Essential (primary) hypertension: Secondary | ICD-10-CM | POA: Diagnosis not present

## 2022-11-20 DIAGNOSIS — H348312 Tributary (branch) retinal vein occlusion, right eye, stable: Secondary | ICD-10-CM | POA: Diagnosis not present

## 2022-11-20 DIAGNOSIS — H353122 Nonexudative age-related macular degeneration, left eye, intermediate dry stage: Secondary | ICD-10-CM

## 2022-11-20 DIAGNOSIS — H35033 Hypertensive retinopathy, bilateral: Secondary | ICD-10-CM

## 2022-11-20 DIAGNOSIS — H43813 Vitreous degeneration, bilateral: Secondary | ICD-10-CM

## 2022-11-20 DIAGNOSIS — H353114 Nonexudative age-related macular degeneration, right eye, advanced atrophic with subfoveal involvement: Secondary | ICD-10-CM | POA: Diagnosis not present

## 2023-02-05 ENCOUNTER — Encounter: Payer: Self-pay | Admitting: *Deleted

## 2023-02-06 ENCOUNTER — Ambulatory Visit: Payer: Medicare PPO | Admitting: Anesthesiology

## 2023-02-06 ENCOUNTER — Ambulatory Visit
Admission: RE | Admit: 2023-02-06 | Discharge: 2023-02-06 | Disposition: A | Discharge status: Home / Self Care | Payer: Medicare PPO | Attending: Gastroenterology | Admitting: Gastroenterology

## 2023-02-06 ENCOUNTER — Encounter
Admission: RE | Disposition: A | Discharge status: Home / Self Care | Payer: Self-pay | Source: Home / Self Care | Attending: Gastroenterology

## 2023-02-06 ENCOUNTER — Encounter: Payer: Self-pay | Admitting: *Deleted

## 2023-02-06 DIAGNOSIS — K317 Polyp of stomach and duodenum: Secondary | ICD-10-CM | POA: Insufficient documentation

## 2023-02-06 DIAGNOSIS — Z7984 Long term (current) use of oral hypoglycemic drugs: Secondary | ICD-10-CM | POA: Diagnosis not present

## 2023-02-06 DIAGNOSIS — K2289 Other specified disease of esophagus: Secondary | ICD-10-CM | POA: Diagnosis not present

## 2023-02-06 DIAGNOSIS — R1310 Dysphagia, unspecified: Secondary | ICD-10-CM | POA: Diagnosis present

## 2023-02-06 DIAGNOSIS — K449 Diaphragmatic hernia without obstruction or gangrene: Secondary | ICD-10-CM | POA: Diagnosis not present

## 2023-02-06 DIAGNOSIS — E119 Type 2 diabetes mellitus without complications: Secondary | ICD-10-CM | POA: Insufficient documentation

## 2023-02-06 DIAGNOSIS — G4730 Sleep apnea, unspecified: Secondary | ICD-10-CM | POA: Diagnosis not present

## 2023-02-06 DIAGNOSIS — Z950 Presence of cardiac pacemaker: Secondary | ICD-10-CM | POA: Diagnosis not present

## 2023-02-06 DIAGNOSIS — K219 Gastro-esophageal reflux disease without esophagitis: Secondary | ICD-10-CM | POA: Diagnosis not present

## 2023-02-06 DIAGNOSIS — I10 Essential (primary) hypertension: Secondary | ICD-10-CM | POA: Diagnosis not present

## 2023-02-06 HISTORY — DX: Type 2 diabetes mellitus without complications: E11.9

## 2023-02-06 HISTORY — PX: ESOPHAGOGASTRODUODENOSCOPY (EGD) WITH PROPOFOL: SHX5813

## 2023-02-06 SURGERY — ESOPHAGOGASTRODUODENOSCOPY (EGD) WITH PROPOFOL
Anesthesia: General

## 2023-02-06 MED ORDER — SODIUM CHLORIDE 0.9 % IV SOLN: INTRAVENOUS | Status: DC

## 2023-02-06 MED ORDER — GLYCOPYRROLATE 0.2 MG/ML IJ SOLN
INTRAMUSCULAR | Status: DC | PRN
  Administered 2023-02-06: .2 mg via INTRAVENOUS

## 2023-02-06 MED ORDER — LIDOCAINE HCL (CARDIAC) PF 100 MG/5ML IV SOSY
PREFILLED_SYRINGE | INTRAVENOUS | Status: DC | PRN
  Administered 2023-02-06: 100 mg via INTRAVENOUS

## 2023-02-06 MED ORDER — PROPOFOL 10 MG/ML IV BOLUS
INTRAVENOUS | Status: DC | PRN
  Administered 2023-02-06: 60 mg via INTRAVENOUS

## 2023-02-06 MED ORDER — PROPOFOL 500 MG/50ML IV EMUL
INTRAVENOUS | Status: DC | PRN
  Administered 2023-02-06: 145 ug/kg/min via INTRAVENOUS

## 2023-02-06 NOTE — H&P (Signed)
Outpatient short stay form Pre-procedure 02/06/2023  Regis Bill, MD  Primary Physician: Bosie Clos, MD  Reason for visit:  Dysphagia  History of present illness:    77 y/o lady with history of hypertension and a pacemaker for bradycardia here for EGD for solid food dysphagia. No blood thinners. No family history of GI malignancies. No significant neck or abdominal surgeries.    Current Facility-Administered Medications:    0.9 %  sodium chloride infusion, , Intravenous, Continuous, , Rossie Muskrat, MD, Last Rate: 20 mL/hr at 02/06/23 0814, New Bag at 02/06/23 0814  Medications Prior to Admission  Medication Sig Dispense Refill Last Dose   amLODipine (NORVASC) 5 MG tablet TAKE 1 TABLET BY MOUTH EVERY DAY IN THE EVENING 90 tablet 2 02/05/2023   metFORMIN (GLUCOPHAGE) 500 MG tablet Take 1 tablet (500 mg total) by mouth 2 (two) times daily with a meal. 180 tablet 1 02/05/2023   omeprazole (PRILOSEC) 20 MG capsule TAKE 1 CAPSULE BY MOUTH EVERY DAY IN THE MORNING 90 capsule 4 02/05/2023   rosuvastatin (CRESTOR) 10 MG tablet TAKE 1 TABLET BY MOUTH EVERY DAY 90 tablet 3 02/05/2023   telmisartan (MICARDIS) 80 MG tablet TAKE 1 TABLET BY MOUTH EVERY DAY 90 tablet 3 02/05/2023   ACCU-CHEK GUIDE test strip CHECK BLOOD SUGAR EVERY DAY 100 strip 2    Accu-Chek Softclix Lancets lancets CHECK BLOOD SUGAR ONCE DAILY 100 each 12    blood glucose meter kit and supplies Dispense based on patient and insurance preference. Check blood sugar once a day (FOR ICD-10 E10.9, E11.9). 1 each 0    cholecalciferol (VITAMIN D3) 25 MCG (1000 UNIT) tablet Take 1,000 Units by mouth daily.      hydrochlorothiazide (MICROZIDE) 12.5 MG capsule TAKE 1 CAPSULE BY MOUTH EVERY DAY 90 capsule 1    Multiple Vitamins-Minerals (PRESERVISION/LUTEIN) CAPS Take 1 capsule by mouth at bedtime.      vitamin E 400 UNIT capsule Take 400 Units by mouth daily. Reported on 09/15/2015        No Known Allergies   Past Medical  History:  Diagnosis Date   Arthritis    Breast cancer (HCC) 1985   left breast cancer - chemotherapy   Cancer (HCC)    Breast   Diabetes mellitus without complication (HCC)    GERD (gastroesophageal reflux disease)    Headache    migraines h/o   Hypertension    Personal history of chemotherapy     Review of systems:  Otherwise negative.    Physical Exam  Gen: Alert, oriented. Appears stated age.  HEENT: PERRLA. Lungs: No respiratory distress CV: RRR Abd: soft, benign, no masses Ext: No edema    Planned procedures: Proceed with EGD. The patient understands the nature of the planned procedure, indications, risks, alternatives and potential complications including but not limited to bleeding, infection, perforation, damage to internal organs and possible oversedation/side effects from anesthesia. The patient agrees and gives consent to proceed.  Please refer to procedure notes for findings, recommendations and patient disposition/instructions.     Regis Bill, MD [facility] Gastroenterology

## 2023-02-06 NOTE — Transfer of Care (Signed)
Immediate Anesthesia Transfer of Care Note  Patient: Erin Middleton  Procedure(s) Performed: ESOPHAGOGASTRODUODENOSCOPY (EGD) WITH PROPOFOL  Patient Location: Endoscopy Unit  Anesthesia Type:General  Level of Consciousness: drowsy and patient cooperative  Airway & Oxygen Therapy: Patient Spontanous Breathing and Patient connected to face mask oxygen  Post-op Assessment: Report given to RN and Post -op Vital signs reviewed and stable  Post vital signs: Reviewed and stable  Last Vitals:  Vitals Value Taken Time  BP 101/55 02/06/23 0850  Temp 35.7 C 02/06/23 0850  Pulse 71 02/06/23 0850  Resp 18 02/06/23 0850  SpO2 100 % 02/06/23 0850    Last Pain:  Vitals:   02/06/23 0850  TempSrc: Temporal  PainSc: Asleep         Complications: No notable events documented.

## 2023-02-06 NOTE — Anesthesia Procedure Notes (Signed)
Procedure Name: General with mask airway Date/Time: 02/06/2023 8:36 AM  Performed by: Mohammed Kindle, CRNAPre-anesthesia Checklist: Patient identified, Emergency Drugs available, Suction available and Patient being monitored Oxygen Delivery Method: Simple face mask Induction Type: IV induction Placement Confirmation: positive ETCO2, CO2 detector and breath sounds checked- equal and bilateral Dental Injury: Teeth and Oropharynx as per pre-operative assessment

## 2023-02-06 NOTE — Interval H&P Note (Signed)
History and Physical Interval Note:  02/06/2023 8:33 AM  Erin Middleton  has presented today for surgery, with the diagnosis of pharyggoesophageal dysphagia.  The various methods of treatment have been discussed with the patient and family. After consideration of risks, benefits and other options for treatment, the patient has consented to  Procedure(s): ESOPHAGOGASTRODUODENOSCOPY (EGD) WITH PROPOFOL (N/A) as a surgical intervention.  The patient's history has been reviewed, patient examined, no change in status, stable for surgery.  I have reviewed the patient's chart and labs.  Questions were answered to the patient's satisfaction.     Regis Bill  Ok to proceed with EGD

## 2023-02-06 NOTE — Op Note (Signed)
[facility] Gastroenterology Patient Name: Erin Middleton Procedure Date: 02/06/2023 8:25 AM MRN: [id number] Account #: [id number] Date of Birth: May 22, 1946 Admit Type: Outpatient Age: 77 Room: [facility] ENDO ROOM 1 Gender: Female Note Status: Finalized Instrument Name: Patton Salles Endoscope [id number] Procedure:             Upper GI endoscopy Indications:           Dysphagia Providers:             Eather Colas MD, MD Referring MD:          Eather Colas MD, MD (Referring MD), Ferdinand Lango.                         Sullivan Lone, MD (Referring MD) Medicines:             Monitored Anesthesia Care Complications:         No immediate complications. Estimated blood loss:                         Minimal. Procedure:             Pre-Anesthesia Assessment:                        - Prior to the procedure, a History and Physical was                         performed, and patient medications and allergies were                         reviewed. The patient is competent. The risks and                         benefits of the procedure and the sedation options and                         risks were discussed with the patient. All questions                         were answered and informed consent was obtained.                         Patient identification and proposed procedure were                         verified by the physician, the nurse, the                         anesthesiologist, the anesthetist and the technician                         in the endoscopy suite. Mental Status Examination:                         alert and oriented. Airway Examination: normal                         oropharyngeal airway and neck mobility. Respiratory  Examination: clear to auscultation. CV Examination:                         normal. Prophylactic Antibiotics: The patient does not                         require prophylactic antibiotics. Prior                         Anticoagulants:  The patient has taken no anticoagulant                         or antiplatelet agents. ASA Grade Assessment: III - A                         patient with severe systemic disease. After reviewing                         the risks and benefits, the patient was deemed in                         satisfactory condition to undergo the procedure. The                         anesthesia plan was to use monitored anesthesia care                         (MAC). Immediately prior to administration of                         medications, the patient was re-assessed for adequacy                         to receive sedatives. The heart rate, respiratory                         rate, oxygen saturations, blood pressure, adequacy of                         pulmonary ventilation, and response to care were                         monitored throughout the procedure. The physical                         status of the patient was re-assessed after the                         procedure.                        After obtaining informed consent, the endoscope was                         passed under direct vision. Throughout the procedure,                         the patient's blood pressure, pulse, and oxygen  saturations were monitored continuously. The Endoscope                         was introduced through the mouth, and advanced to the                         second part of duodenum. The upper GI endoscopy was                         accomplished without difficulty. The patient tolerated                         the procedure well. Findings:      The lower third of the esophagus was moderately tortuous. Biopsies were       obtained from the proximal and distal esophagus with cold forceps for       histology of suspected eosinophilic esophagitis. Estimated blood loss       was minimal.      A 5 cm hiatal hernia was present.      Multiple small sessile fundic gland polyps with no bleeding  and no       stigmata of recent bleeding were found in the gastric fundus and in the       gastric body.      The exam of the stomach was otherwise normal.      The examined duodenum was normal. Impression:            - Tortuous esophagus.                        - 5 cm hiatal hernia.                        - Multiple fundic gland polyps.                        - Normal examined duodenum.                        - Biopsies were taken with a cold forceps for                         evaluation of eosinophilic esophagitis. Recommendation:        - Discharge patient to home.                        - Resume previous diet.                        - Continue present medications.                        - Await pathology results.                        - Perform a barium swallow using barium in liquid and                         tablet form at appointment to be scheduled.                        -  Return to referring physician as previously                         scheduled. Procedure Code(s):     --- Professional ---                        [phone], Esophagogastroduodenoscopy, flexible,                         transoral; with biopsy, single or multiple Diagnosis Code(s):     --- Professional ---                        Q39.9, Congenital malformation of esophagus,                         unspecified                        K44.9, Diaphragmatic hernia without obstruction or                         gangrene                        K31.7, Polyp of stomach and duodenum                        R13.10, Dysphagia, unspecified CPT copyright 2022 American Medical Association. All rights reserved. The codes documented in this report are preliminary and upon coder review may  be revised to meet current compliance requirements. Eather Colas MD, MD 02/06/2023 8:50:48 AM Number of Addenda: 0 Note Initiated On: 02/06/2023 8:25 AM Estimated Blood Loss:  Estimated blood loss was minimal.      [facility]

## 2023-02-06 NOTE — Anesthesia Postprocedure Evaluation (Signed)
Anesthesia Post Note  Patient: Erin Middleton  Procedure(s) Performed: ESOPHAGOGASTRODUODENOSCOPY (EGD) WITH PROPOFOL  Patient location during evaluation: PACU Anesthesia Type: General Level of consciousness: awake and alert Pain management: pain level controlled Vital Signs Assessment: post-procedure vital signs reviewed and stable Respiratory status: spontaneous breathing, nonlabored ventilation, respiratory function stable and patient connected to nasal cannula oxygen Cardiovascular status: blood pressure returned to baseline and stable Postop Assessment: no apparent nausea or vomiting Anesthetic complications: no   No notable events documented.   Last Vitals:  Vitals:   02/06/23 0850 02/06/23 0917  BP: (!) 101/55 121/72  Pulse: 71 74  Resp: 18 16  Temp: (!) 35.7 C   SpO2: 100% 99%    Last Pain:  Vitals:   02/06/23 0917  TempSrc:   PainSc: 0-No pain                 Cleda Mccreedy 

## 2023-02-06 NOTE — Anesthesia Preprocedure Evaluation (Signed)
Anesthesia Evaluation  Patient identified by MRN, date of birth, ID band Patient awake    Reviewed: Allergy & Precautions, NPO status , Patient's Chart, lab work & pertinent test results  History of Anesthesia Complications Negative for: history of anesthetic complications  Airway Mallampati: III  TM Distance: <3 FB Neck ROM: full    Dental  (+) Chipped   Pulmonary shortness of breath, sleep apnea    Pulmonary exam normal        Cardiovascular hypertension, Normal cardiovascular exam+ dysrhythmias + Valvular Problems/Murmurs      Neuro/Psych  Headaches  negative psych ROS   GI/Hepatic Neg liver ROS,GERD  Controlled,,  Endo/Other  negative endocrine ROSdiabetes    Renal/GU negative Renal ROS  negative genitourinary   Musculoskeletal   Abdominal   Peds  Hematology negative hematology ROS (+)   Anesthesia Other Findings Patient reports that they do not think that any food or pills are stuck in their throat at this time.  Past Medical History: No date: Arthritis 1985: Breast cancer (HCC)     Comment:  left breast cancer - chemotherapy No date: Cancer (HCC)     Comment:  Breast No date: Diabetes mellitus without complication (HCC) No date: GERD (gastroesophageal reflux disease) No date: Headache     Comment:  migraines h/o No date: Hypertension No date: Personal history of chemotherapy  Past Surgical History: 04/01/2015: COLONOSCOPY WITH PROPOFOL; N/A     Comment:  Procedure: COLONOSCOPY WITH PROPOFOL;  Surgeon: Wallace Cullens, MD;  Location: [facility] ENDOSCOPY;  Service:               Gastroenterology;  Laterality: N/A; 11/09/2020: COLONOSCOPY WITH PROPOFOL; N/A     Comment:  Procedure: COLONOSCOPY WITH PROPOFOL;  Surgeon:               Regis Bill, MD;  Location: [facility] ENDOSCOPY;                Service: Endoscopy;  Laterality: N/A; No date: DILATION AND CURETTAGE OF UTERUS No date: MASTECTOMY;  Left No date: NECK SURGERY     Comment:  neck deformity-straighten out neck as a child 12/27/2020: PACEMAKER LEADLESS INSERTION; N/A     Comment:  Procedure: PACEMAKER LEADLESS INSERTION;  Surgeon:               Marcina Millard, MD;  Location: [facility] INVASIVE CV               LAB;  Service: Cardiovascular;  Laterality: N/A; No date: PALATE / UVULA BIOPSY / EXCISION 12/26/2020: TEMPORARY PACEMAKER; N/A     Comment:  Procedure: TEMPORARY PACEMAKER;  Surgeon: Marcina Millard, MD;  Location: [facility] INVASIVE CV LAB;  Service:              Cardiovascular;  Laterality: N/A; 09/22/2015: TOTAL KNEE ARTHROPLASTY; Left     Comment:  Procedure: TOTAL KNEE ARTHROPLASTY;  Surgeon: Deeann Saint, MD;  Location: [facility] ORS;  Service: Orthopedics;                Laterality: Left; No date: US CAROTID DOPPLER BILATERAL ([facility] HX)  BMI    Body Mass Index: 26.32 kg/m      Reproductive/Obstetrics negative OB ROS  Anesthesia Physical Anesthesia Plan  ASA: 3  Anesthesia Plan: General   Post-op Pain Management:    Induction: Intravenous  PONV Risk Score and Plan: Propofol infusion and TIVA  Airway Management Planned: Natural Airway and Nasal Cannula  Additional Equipment:   Intra-op Plan:   Post-operative Plan:   Informed Consent: I have reviewed the patients History and Physical, chart, labs and discussed the procedure including the risks, benefits and alternatives for the proposed anesthesia with the patient or authorized representative who has indicated his/her understanding and acceptance.     Dental Advisory Given  Plan Discussed with: Anesthesiologist, CRNA and Surgeon  Anesthesia Plan Comments: (Patient consented for risks of anesthesia including but not limited to:  - adverse reactions to medications - risk of airway placement if required - damage to eyes, teeth, lips or other oral mucosa - nerve  damage due to positioning  - sore throat or hoarseness - Damage to heart, brain, nerves, lungs, other parts of body or loss of life  Patient voiced understanding.)       Anesthesia Quick Evaluation

## 2023-02-07 ENCOUNTER — Encounter: Payer: Self-pay | Admitting: Gastroenterology

## 2023-02-22 ENCOUNTER — Other Ambulatory Visit: Payer: Self-pay | Admitting: Gastroenterology

## 2023-02-22 DIAGNOSIS — R1314 Dysphagia, pharyngoesophageal phase: Secondary | ICD-10-CM

## 2023-02-27 ENCOUNTER — Ambulatory Visit
Admission: RE | Admit: 2023-02-27 | Discharge: 2023-02-27 | Disposition: A | Discharge status: Home / Self Care | Payer: Medicare PPO | Source: Ambulatory Visit | Attending: Gastroenterology | Admitting: Gastroenterology

## 2023-02-27 DIAGNOSIS — R1314 Dysphagia, pharyngoesophageal phase: Secondary | ICD-10-CM | POA: Diagnosis present

## 2023-05-28 ENCOUNTER — Encounter (INDEPENDENT_AMBULATORY_CARE_PROVIDER_SITE_OTHER): Payer: Medicare PPO | Admitting: Ophthalmology

## 2023-05-28 DIAGNOSIS — H353134 Nonexudative age-related macular degeneration, bilateral, advanced atrophic with subfoveal involvement: Secondary | ICD-10-CM

## 2023-05-28 DIAGNOSIS — I10 Essential (primary) hypertension: Secondary | ICD-10-CM

## 2023-05-28 DIAGNOSIS — H35033 Hypertensive retinopathy, bilateral: Secondary | ICD-10-CM | POA: Diagnosis not present

## 2023-05-28 DIAGNOSIS — H348312 Tributary (branch) retinal vein occlusion, right eye, stable: Secondary | ICD-10-CM

## 2023-05-28 DIAGNOSIS — H43813 Vitreous degeneration, bilateral: Secondary | ICD-10-CM

## 2023-06-12 ENCOUNTER — Encounter: Payer: Self-pay | Admitting: Internal Medicine

## 2023-06-12 ENCOUNTER — Ambulatory Visit: Payer: Medicare PPO | Admitting: Internal Medicine

## 2023-06-12 VITALS — BP 126/72 | HR 77 | Temp 97.8°F | Ht 63.0 in | Wt 148.0 lb

## 2023-06-12 DIAGNOSIS — G4733 Obstructive sleep apnea (adult) (pediatric): Secondary | ICD-10-CM | POA: Diagnosis not present

## 2023-06-12 NOTE — Progress Notes (Signed)
@Patient  ID: Erin Middleton, female    DOB: 1945/08/07, 77 y.o.   MRN: [id number]   TESTS/DOWNLOADS Patient's home sleep study which was done in May/2020 showed moderate obstructive sleep apnea with an AHI of 22.6, SaO2 low 66%  10/2020 Compliance report 97% for days 9% for greater than 4 hours AHI reduced to 1.2 previous AHI of 22  CPAP DL 01/6577 Excellent compliance report 90% days and 83%>4 hrs AHI 1.2    CC Follow-up assessment of OSA Previous AHI 22    HPI:  Assessment of OSA Compliance report reviewed in detail with the patient Excellent compliance report AHI reduced significantly Auto CPAP 5-20 AHI reduced to 1.6  No exacerbation at this time No evidence of heart failure at this time No evidence or Middleton of infection at this time No respiratory distress No fevers, chills, nausea, vomiting, diarrhea No evidence of lower extremity edema No evidence hemoptysis  Patient with a bicuspid aortic valve severe stenosis Patient being assessed for valve surgery at [facility] next week  I have recommended patient bring her CPAP machine during her hospitalization Her AHI is 22 without CPAP  CBC    Component Value Date/Time   WBC 8.9 01/17/2021 1603   WBC 7.4 12/26/2020 1102   RBC 4.24 01/17/2021 1603   RBC 4.11 12/26/2020 1102   HGB 13.4 01/17/2021 1603   HCT 40.4 01/17/2021 1603   PLT 231 01/17/2021 1603   MCV 95 01/17/2021 1603   MCH 31.6 01/17/2021 1603   MCH 32.1 12/26/2020 1102   MCHC 33.2 01/17/2021 1603   MCHC 34.7 12/26/2020 1102   RDW 12.8 01/17/2021 1603   LYMPHSABS 2.5 01/17/2021 1603   MONOABS 0.4 12/26/2020 1102   EOSABS 0.4 01/17/2021 1603   BASOSABS 0.0 01/17/2021 1603    BMET    Component Value Date/Time   NA 141 10/27/2021 0935   K 4.2 10/27/2021 0935   CL 102 10/27/2021 0935   CO2 22 10/27/2021 0935   GLUCOSE 201 (H) 10/27/2021 0935   GLUCOSE 136 (H) 12/28/2020 0528   BUN 19 10/27/2021 0935   CREATININE 1.03 (H) 10/27/2021 0935    CREATININE 0.94 (H) 06/06/2017 1120   CALCIUM 9.3 10/27/2021 0935   GFRNONAA >60 12/28/2020 0528   GFRNONAA 60 06/06/2017 1120   GFRAA 66 08/03/2020 1120   GFRAA 70 06/06/2017 1120    BNP    Component Value Date/Time   BNP 81.0 09/19/2018 1120      No Known Allergies  Immunization History  Administered Date(s) Administered   Fluad Quad(high Dose 65+) 04/15/2019, 05/19/2020, 05/04/2021   Influenza, High Dose Seasonal PF 05/10/2016, 06/06/2017, 03/07/2018   Influenza-Unspecified 04/09/2015   PFIZER(Purple Top)SARS-COV-2 Vaccination 07/26/2019, 08/18/2019, 06/01/2020   Pneumococcal Conjugate-13 05/10/2016   Pneumococcal Polysaccharide-23 03/06/2012   Td 01/23/2003    Past Medical History:  Diagnosis Date   Arthritis    Breast cancer (HCC) 1985   left breast cancer - chemotherapy   Cancer (HCC)    Breast   Diabetes mellitus without complication (HCC)    GERD (gastroesophageal reflux disease)    Headache    migraines h/o   Hypertension    Personal history of chemotherapy     BP 126/72 (BP Location: Right Arm, Cuff Size: Normal)   Pulse 77   Temp 97.8 F (36.6 C) (Temporal)   Ht 5\' 3"  (1.6 m)   Wt 148 lb (67.1 kg)   SpO2 98%   BMI 26.22 kg/m  Review of Systems: Gen:  Denies  fever, sweats, chills weight loss  HEENT: Denies blurred vision, double vision, ear pain, eye pain, hearing loss, nose bleeds, sore throat Cardiac:  No dizziness, chest pain or heaviness, chest tightness,edema, No JVD Resp:   No cough, -sputum production, +shortness of breath,-wheezing, -hemoptysis,  Other:  All other systems negative   Physical Examination:   General Appearance: No distress  EYES PERRLA, EOM intact.   NECK Supple, No JVD Pulmonary: normal breath sounds, No wheezing.  CardiovascularNormal S1,S2.  + Systolic ejection murmur Abdomen: Benign, Soft, non-tender. Neurology UE/LE 5/5 strength, no focal deficits Ext pulses intact, cap refill intact ALL OTHER ROS ARE  NEGATIVE    Assessment & Plan:   77 year old pleasant white female seen today for follow-up assessment for OSA  Status post pacemaker placement August 2022, patient with severe aortic stenosis Plan for valve assessment repair surgery next week at [facility]  Regarding assessment of sleep apnea Patient is well-controlled with current CPAP therapy prescription Auto CPAP 5-20 AHI significantly reduced down to 1.6  Pulmonary artery hypertension Echocardiogram shows elevated PA pressures Patient states that right heart cath showed normal PA pressures Patient is undergoing valve repair surgery next week at [facility] No evidence of acute heart failure at this time    Regarding assessment of sleep apnea Well-controlled with CPAP therapy  Pulmonary hypertension on echocardiogram Most likely related to sleep apnea Continue CPAP therapy as prescribed  Hypertension - Sleep apnea can contribute to hypertension, therefore treatment of sleep apnea is important part of hypertension management.  Diabetes Mellitis - Sleep apnea can contribute to DM, therefore treatment of sleep apnea is important part of DM management.    MEDICATION ADJUSTMENTS/LABS AND TESTS ORDERED: Continue CPAP as prescribed Avoid secondhand smoke Avoid SICK contacts Recommend  Masking  when appropriate Recommend Keep up-to-date with vaccinations   CURRENT MEDICATIONS REVIEWED AT LENGTH WITH PATIENT TODAY   Patient  satisfied with Plan of action and management. All questions answered  Follow up  1 year    Time Spent Involved in Patient Care on Day of Examination: 32 mins     Calisha Tindel Santiago Glad, M.D.  [facility]  Medical Director [facility] Medical Director [facility] Cardio-Pulmonary Department

## 2023-06-12 NOTE — Patient Instructions (Signed)
Excellent job A+  Continue CPAP as prescribed  Avoid secondhand smoke Avoid SICK contacts Recommend  Masking  when appropriate Recommend Keep up-to-date with vaccinations   Good luck with surgery next week!

## 2023-06-18 DIAGNOSIS — I5030 Unspecified diastolic (congestive) heart failure: Secondary | ICD-10-CM | POA: Insufficient documentation

## 2023-06-18 DIAGNOSIS — Z950 Presence of cardiac pacemaker: Secondary | ICD-10-CM | POA: Insufficient documentation

## 2023-08-27 DIAGNOSIS — Z9889 Other specified postprocedural states: Secondary | ICD-10-CM | POA: Insufficient documentation

## 2023-09-17 DIAGNOSIS — I951 Orthostatic hypotension: Secondary | ICD-10-CM | POA: Insufficient documentation

## 2023-09-17 DIAGNOSIS — I9789 Other postprocedural complications and disorders of the circulatory system, not elsewhere classified: Secondary | ICD-10-CM | POA: Insufficient documentation

## 2023-11-19 ENCOUNTER — Encounter (INDEPENDENT_AMBULATORY_CARE_PROVIDER_SITE_OTHER): Payer: Medicare PPO | Admitting: Ophthalmology

## 2023-11-19 DIAGNOSIS — I10 Essential (primary) hypertension: Secondary | ICD-10-CM | POA: Diagnosis not present

## 2023-11-19 DIAGNOSIS — H348312 Tributary (branch) retinal vein occlusion, right eye, stable: Secondary | ICD-10-CM

## 2023-11-19 DIAGNOSIS — H35033 Hypertensive retinopathy, bilateral: Secondary | ICD-10-CM

## 2023-11-19 DIAGNOSIS — H43813 Vitreous degeneration, bilateral: Secondary | ICD-10-CM

## 2023-11-19 DIAGNOSIS — H353114 Nonexudative age-related macular degeneration, right eye, advanced atrophic with subfoveal involvement: Secondary | ICD-10-CM

## 2023-11-19 DIAGNOSIS — H353122 Nonexudative age-related macular degeneration, left eye, intermediate dry stage: Secondary | ICD-10-CM | POA: Diagnosis not present

## 2023-12-21 ENCOUNTER — Other Ambulatory Visit: Payer: Self-pay | Admitting: Family Medicine

## 2023-12-21 DIAGNOSIS — Z1231 Encounter for screening mammogram for malignant neoplasm of breast: Secondary | ICD-10-CM

## 2023-12-24 ENCOUNTER — Ambulatory Visit: Payer: Medicare PPO | Admitting: Internal Medicine

## 2023-12-25 ENCOUNTER — Encounter: Payer: Self-pay | Admitting: Internal Medicine

## 2023-12-25 ENCOUNTER — Ambulatory Visit: Admitting: Internal Medicine

## 2023-12-25 VITALS — BP 120/80 | HR 80 | Temp 97.6°F | Ht 60.0 in | Wt 136.2 lb

## 2023-12-25 DIAGNOSIS — G4733 Obstructive sleep apnea (adult) (pediatric): Secondary | ICD-10-CM | POA: Diagnosis not present

## 2023-12-25 NOTE — Patient Instructions (Signed)

## 2023-12-25 NOTE — Progress Notes (Unsigned)
 @Patient  ID: Erin Middleton, female    DOB: 1945-11-24, 78 y.o.   MRN: [id number]   TESTS/DOWNLOADS Patient's home sleep study which was done in May/2020 showed moderate obstructive sleep apnea with an AHI of 22.6, SaO2 low 66%  10/2020 Compliance report 97% for days 9% for greater than 4 hours AHI reduced to 1.2 previous AHI of 22  CPAP DL 89/7977 Excellent compliance report 90% days and 83%>4 hrs AHI 1.2    CC Follow-up assessment for OSA Previous AHI 22 Status post aortic valve repair February 2025  HPI:  Assessment of OSA Discussed sleep data and reviewed with patient.  Encouraged proper weight management.  Discussed driving precautions and its relationship with hypersomnolence.  Discussed sleep hygiene, and benefits of a fixed sleep waked time.  The importance of getting eight or more hours of sleep discussed with patient.  Discussed limiting the use of the computer and television before bedtime.  Decrease naps during the day, so night time sleep will become enhanced.  Limit caffeine, and sleep deprivation.   Patient uses and benefits from therapy Using CPAP nightly and with naps Pressure setting is comfortable and is sleeping well. Auto CPAP 5-20 AHI reduced to 1.9  No exacerbation at this time No evidence of heart failure at this time No evidence or signs of infection at this time No respiratory distress No fevers, chills, nausea, vomiting, diarrhea No evidence of lower extremity edema No evidence hemoptysis  Status post AV repair Postop respiratory failure in the ICU for 3 and half weeks History of pleural effusions status post thoracentesis status post chest tube No significant shortness of breath at this time Patient does have fatigue intermittently  Ambulating pulse oximetry in the office did not show any significant hypoxia     CBC    Component Value Date/Time   WBC 8.9 01/17/2021 1603   WBC 7.4 12/26/2020 1102   RBC 4.24 01/17/2021 1603    RBC 4.11 12/26/2020 1102   HGB 13.4 01/17/2021 1603   HCT 40.4 01/17/2021 1603   PLT 231 01/17/2021 1603   MCV 95 01/17/2021 1603   MCH 31.6 01/17/2021 1603   MCH 32.1 12/26/2020 1102   MCHC 33.2 01/17/2021 1603   MCHC 34.7 12/26/2020 1102   RDW 12.8 01/17/2021 1603   LYMPHSABS 2.5 01/17/2021 1603   MONOABS 0.4 12/26/2020 1102   EOSABS 0.4 01/17/2021 1603   BASOSABS 0.0 01/17/2021 1603    BMET    Component Value Date/Time   NA 141 10/27/2021 0935   K 4.2 10/27/2021 0935   CL 102 10/27/2021 0935   CO2 22 10/27/2021 0935   GLUCOSE 201 (H) 10/27/2021 0935   GLUCOSE 136 (H) 12/28/2020 0528   BUN 19 10/27/2021 0935   CREATININE 1.03 (H) 10/27/2021 0935   CREATININE 0.94 (H) 06/06/2017 1120   CALCIUM  9.3 10/27/2021 0935   GFRNONAA >60 12/28/2020 0528   GFRNONAA 60 06/06/2017 1120   GFRAA 66 08/03/2020 1120   GFRAA 70 06/06/2017 1120    BNP    Component Value Date/Time   BNP 81.0 09/19/2018 1120      Allergies  Allergen Reactions   Chocolate Other (See Comments)    Per patient and daughter    Immunization History  Administered Date(s) Administered   Fluad Quad(high Dose 65+) 04/15/2019, 05/19/2020, 05/04/2021   Influenza, High Dose Seasonal PF 05/10/2016, 06/06/2017, 03/07/2018   Influenza, Mdck, Trivalent,PF 6+ MOS(egg free) 03/22/2023   Influenza-Unspecified 04/09/2015   PFIZER(Purple Top)SARS-COV-2 Vaccination 07/26/2019,  08/18/2019, 06/01/2020   Pneumococcal Conjugate-13 05/10/2016   Pneumococcal Polysaccharide-23 03/06/2012   Td 01/23/2003    Past Medical History:  Diagnosis Date   Arthritis    Breast cancer (HCC) 1985   left breast cancer - chemotherapy   Cancer (HCC)    Breast   Diabetes mellitus without complication (HCC)    GERD (gastroesophageal reflux disease)    Headache    migraines h/o   Hypertension    Personal history of chemotherapy    BP 120/80 (BP Location: Right Arm, Patient Position: Sitting, Cuff Size: Normal)   Pulse 80    Temp 97.6 F (36.4 C) (Oral)   Ht 5' (1.524 m)   Wt 136 lb 3.2 oz (61.8 kg)   SpO2 96%   BMI 26.60 kg/m    Review of Systems: Gen:  Denies  fever, sweats, chills weight loss  HEENT: Denies blurred vision, double vision, ear pain, eye pain, hearing loss, nose bleeds, sore throat Cardiac:  No dizziness, chest pain or heaviness, chest tightness,edema, No JVD Resp:   No cough, -sputum production, -shortness of breath,-wheezing, -hemoptysis,  Other:  All other systems negative   Physical Examination:   General Appearance: No distress  EYES PERRLA, EOM intact.   NECK Supple, No JVD Pulmonary: normal breath sounds, No wheezing.  CardiovascularNormal S1,S2.  No m/r/g.   Abdomen: Benign, Soft, non-tender. Neurology UE/LE 5/5 strength, no focal deficits Ext pulses intact, cap refill intact ALL OTHER ROS ARE NEGATIVE     Assessment & Plan:   78 year old pleasant white female seen today for follow-up assessment for OSA, Status post pacemaker placement August 2022, patient with severe aortic stenosis s/p AV repair   Assessment of OSA Previous AHI 22 Continue CPAP as prescribed  Excellent compliance report Reviewed compliance report in detail with patient Patient definitely benefits the use of CPAP therapy as prescribed Using CPAP nightly and with naps Pressure setting is comfortable and is sleeping well. CPAP prescription 5-20 AHI reduced to 1.9  No evidence of acute heart failure at this time No respiratory distress No fevers, chills, nausea, vomiting, diarrhea No evidence hemoptysis  Patient Instructions Continue to use CPAP every night, minimum of 4-6 hours a night.  Change equipment every 30 days or as directed by DME.  Wash your tubing with warm soap and water daily, hang to dry. Wash humidifier portion weekly. Use bottled, distilled water and change daily   Be aware of reduced alertness and do not drive or operate heavy machinery if experiencing this or drowsiness.   Exercise encouraged, as tolerated. Encouraged proper weight management.  Important to get eight or more hours of sleep  Limiting the use of the computer and television before bedtime.  Decrease naps during the day, so night time sleep will become enhanced.  Limit caffeine, and sleep deprivation.  HTN, stroke, uncontrolled diabetes and heart failure are potential risk factors.  Risk of untreated sleep apnea including cardiac arrhthymias, stroke, DM, pulm HTN.   Pulmonary artery hypertension Echocardiogram shows elevated PA pressures Patient states that right heart cath showed normal PA pressures valve repair surgery next week at [facility] No evidence of acute heart failure at this time Most likely related to sleep apnea Continue CPAP therapy as prescribed   Hypertension - Sleep apnea can contribute to hypertension, therefore treatment of sleep apnea is important part of hypertension management.  Diabetes Mellitis - Sleep apnea can contribute to DM, therefore treatment of sleep apnea is important part of DM management.  Follow-up cardiology   MEDICATION ADJUSTMENTS/LABS AND TESTS ORDERED: Continue CPAP as prescribed Avoid secondhand smoke Avoid SICK contacts Recommend  Masking  when appropriate Recommend Keep up-to-date with vaccinations Follow-up cardiology  CURRENT MEDICATIONS REVIEWED AT LENGTH WITH PATIENT TODAY   Patient  satisfied with Plan of action and management. All questions answered  Follow-up in 6 months    Time Spent Involved in Patient Care on Day of Examination: 42 mins     Edeline Greening Alm Cellar, M.D.  [facility]  Medical Director [facility] Medical Director [facility] Cardio-Pulmonary Department

## 2024-01-07 ENCOUNTER — Ambulatory Visit
Admission: RE | Admit: 2024-01-07 | Discharge: 2024-01-07 | Disposition: A | Discharge status: Home / Self Care | Source: Ambulatory Visit | Attending: Family Medicine | Admitting: Family Medicine

## 2024-01-07 DIAGNOSIS — Z1231 Encounter for screening mammogram for malignant neoplasm of breast: Secondary | ICD-10-CM | POA: Diagnosis present

## 2024-01-09 NOTE — Progress Notes (Addendum)
 Established Patient Visit   Chief Complaint: Chief Complaint  Patient presents with  . Follow-up    ECHO and chest X-ray   Date of Service: 01/09/2024 Date of Birth: 1946-02-23 PCP: Bertrum Charlie Raring, MD [address] [place] [address]  History of Present Illness:   Ms. Maqueda is a 78 y.o.female patient that presents for 6 week f/u.   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 Dewane on 10/25/23 c/o new fatigue and dyspnea over prior few days without edema, weight gain, fevers, or drainage from surgical incision.Patient reported activity level and tolerance since discharge has been slwoly progressing. She is s/p TV ring repair on 2/24 and RIJ removal in OR 2/28 with Dr. Leanor at [facility]. Intraoperatively, her IJ line was caught by the SVC stitch requiring OR take-back for removal. The postoperative course was remarkable for hypotension (midodrine 5mg  TID at d/c), bilateral pleural effusions requiring thoracentesis (R x 1, L x 2), atrial fibrillation (NSR at d/c) for which she did a short course of amiodarone and stopped. She has pulmonary hypertension, follows with [facility] (Dr. Isaiah), who suspects 2/2 OSA. RHC 05/17/23 showed TR disproportionate to degree of PH. Measured pressures very different from RVSP but these pressures fit well with normal RV size and function. She has Micra pacemaker, placed due CHB 12/2020.Device interrogation 08/08/2023 with normal function, 82% RV pacing, est longevity >8 years. She has remote interrogation scheduled for 02/2024. At 10/25/23 visit, she had CXR 10/25/23 that revealed moderate size left pleural effusion. Dependent and/or discoid atelectasis in aerated left lung base. Scarring vs atelectasis in RLL. Pulmonary vascular congestion/mild edema cannot be excluded. 2D ECHO 11/19/23 revealed no LVH, G2DD, normal  biventricular systolic function with EF > 55%. Moderate TR, RVSP 68 mmHg. Well functioning prosthetic TV ring. No valvular stenosis. Moderate TR appreciated on post-op ECHO 09/05/23 additionally. Pt was started on Lasix 20 mg qd after visit with Dr. Custovic and referred to cardiac rehab. Labs 12/25/23 shows stable CMP, CBC. Normal TSH. A1C 6.4%. LDL 27 mg/dL.   She presents for 2 month follow up today. Her daughter and granddaughter are present with her today. She denies dyspnea, but reports I just give out with walking due to fatigue. Denies DOE. She has chronic BLE edema that has improved since valve surgery. Her weight has decreased since surgery. She denies chest pain, orthopnea, palpitations, dizziness, or bleeding. Her fatigue has improved since surgery but is not back to her baseline. She is compliant with CPAP. She did not call to schedule cardiac rehab but is interested. She denies fever, poor incision healing, or discharge from incision. She reports some left back pain with walking that has been present and stable since surgery. She is scheduled for a dental cleaning tomorrow.   Past Medical and Surgical History  Past Medical History Past Medical History:  Diagnosis Date  . Bicuspid aortic valve   . Breast cancer (CMS/HHS-HCC)   . Diabetes mellitus without complication (CMS/HHS-HCC)   . Dysphagia   . GERD (gastroesophageal reflux disease)   . History of breast cancer   . History of permanent cardiac pacemaker placement   . Hx of adenomatous colonic polyps   . Hyperlipidemia   . Hypertension   . Idiopathic pulmonary hypertension (CMS/HHS-HCC) 2020  . Osteoarthritis   . Peripheral artery disease ()   . Plantar fasciitis   .  Pulmonary hypertension (CMS/HHS-HCC)   . Sleep apnea     Past Surgical History She has a past surgical history that includes Mastectomy (Left, 01/2004); Tubal ligation; Tonsillectomy; UVULECTOMY; Colonoscopy (04/01/2015); Cataract extraction; Upper  gastrointestinal endoscopy; Replacement total knee (09/22/2015); Dilation and curettage, diagnostic / therapeutic; Colonoscopy (11/09/2020); EGD @ [facility] (02/06/2023); Joint replacement (2017); repair tricuspid valve (N/A, 08/27/2023); exploration chest for postoperative thrombosis/infection sternotomy (N/A, 08/31/2023); thoracentesis (Right, 09/07/2023); thoracentesis (Left, 09/06/2023); and percutaneous pleural drainage with insertion indwelling catheter (Left, 09/11/2023).   Medications and Allergies  Current Medications  Current Outpatient Medications on File Prior to Visit  Medication Sig Dispense Refill  . acetaminophen  (TYLENOL ) 500 MG tablet Take 500 mg by mouth every 6 (six) hours as needed for Pain    . aspirin  81 MG chewable tablet Take 1 tablet (81 mg total) by mouth once daily 30 tablet 0  . blood glucose diagnostic (ACCU-CHEK GUIDE TEST STRIPS) test strip 1 each (1 strip total) 3 (three) times daily Use as instructed. 100 each 10  . FUROsemide (LASIX) 40 MG tablet Take 40 mg by mouth    . metFORMIN  (GLUCOPHAGE ) 500 MG tablet TAKE 1 TABLET BY MOUTH TWICE A DAY WITH MEALS 180 tablet 1  . multivitamin with minerals, EYE, (PRESERVISION AREDS 2) soft gel capsule Take 1 capsule by mouth 2 (two) times daily with meals    . omeprazole  (PRILOSEC) 20 MG DR capsule Take 20 mg by mouth once daily    . rosuvastatin  (CRESTOR ) 40 MG tablet Take 1 tablet (40 mg total) by mouth at bedtime 90 tablet 3   No current facility-administered medications on file prior to visit.    Allergies: Chocolate  Social and Family History  Social History  reports that she has never smoked. She has never used smokeless tobacco. She reports that she does not drink alcohol and does not use drugs.  Family History Family History  Problem Relation Name Age of Onset  . Stroke Mother Tereso scott   . Heart disease Mother Tereso scott   . Diabetes type II Mother Tereso scott   . Dementia Mother Tereso scott   . High blood pressure  (Hypertension) Mother Tereso scott        Stroke  . Lung cancer Father    . Heart disease Father    . Stroke Brother    . Throat cancer Brother    . Diabetes type II Brother    . Diabetes type II Daughter Burnard Cleveland   . Diabetes type II Brother Marcey Hamilton   . High blood pressure (Hypertension) Brother Marcey Hamilton        TIAs  . Stroke Brother Marcey Hamilton   . High blood pressure (Hypertension) Brother Donald scott        Stroke  . Stroke Brother Donald scott   . High blood pressure (Hypertension) Brother Tanda Hamilton        Stroke  . Stroke Brother Tanda Hamilton   . High blood pressure (Hypertension) Brother Jerel Hamilton        Throat Cancer    Review of Systems   Review of Systems  Constitutional:  Negative for malaise/fatigue.       Denies weight gain  Respiratory:  Negative for shortness of breath.   Cardiovascular:  Negative for chest pain, palpitations and leg swelling.  Neurological:  Negative for dizziness and loss of consciousness.  Endo/Heme/Allergies:  Does not bruise/bleed easily.      Physical Examination   Vitals:BP 126/74  Pulse 83   Ht 152.4 cm (5')   Wt 61.7 kg (136 lb)   LMP  (LMP Unknown)   SpO2 97%   BMI 26.56 kg/m  Ht:152.4 cm (5') Wt:61.7 kg (136 lb) ADJ:Anib surface area is 1.62 meters squared. Body mass index is 26.56 kg/m.  Physical Exam Vitals reviewed.  Constitutional:      General: She is not in acute distress.    Appearance: Normal appearance.   Cardiovascular:     Rate and Rhythm: Normal rate and regular rhythm.     Heart sounds: Normal heart sounds. No murmur heard. Pulmonary:     Effort: Pulmonary effort is normal. No respiratory distress.     Breath sounds: Decreased breath sounds (all lobes, BLL worse than BUL) present.   Musculoskeletal:     Right lower leg: 1+ Pitting Edema present.     Left lower leg: 1+ Pitting Edema present.   Neurological:     Mental Status: She is alert.       Data & Results   Recent  Labs    08/09/22 0948 07/26/23 1110 10/24/23 1027  CHOLTOTAL 122 94* 96*  HDL 63.5 40.4 54.2  LDLCALC 42 34 27  VLDL 17 20 15   TRIG 85 99 74    Recent Labs    08/26/23 2046 08/27/23 1136 09/12/23 1109 09/13/23 0500 09/18/23 0513 10/03/23 1018 10/24/23 1027  NA 139   < > 133*   < > 136 136 140  K 3.6   < > 4.6   < > 4.0 3.8 4.7  BUN 26*   < > 23*   < > 21* 25* 14  CREATININE 1.2*   < > 1.2*   < > 1.1* 1.5* 1.0  CO2 23   < > 23   < > 24 25 24.6  GLUCOSE 156*   < > 166*   < > 123 206* 123*  ALT 26  --  30  --   --   --  21  AST 28  --  26  --   --   --  23  TBILI 0.4  --  0.3*  --   --   --  0.5  ALB 3.2*  --  2.4*  --   --   --  4.2   < > = values in this interval not displayed.    Recent Labs    09/18/23 0513 10/03/23 1018 10/24/23 1027  WBC 9.1 9.3 8.8  HGB 10.7* 12.9 11.9*  HCT 33.1* 39.4 37.3  MCV 97 97 97.9  PLT 339 274 261    Recent Labs    08/09/22 0948 11/21/22 1456 07/26/23 1110 08/26/23 2046 08/27/23 0944 08/27/23 1136 10/24/23 1027 12/25/23 1143  INR  --    < >  --  1.1 1.4* 1.3*  --   --   TSH 7.691*  --  3.205  --   --   --  4.125  --   HGBA1C 7.2*   < > 7.1* 6.9*  --   --   --  6.4*   < > = values in this interval not displayed.        Assessment   78 y.o. female with  Encounter Diagnoses  Name Primary?  . Mixed hyperlipidemia   . Primary hypertension   . Severe tricuspid regurgitation   . PHT (pulmonary hypertension) (CMS/HHS-HCC)   . 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 S/P TVR (tricuspid valve repair)   .  Nonrheumatic tricuspid valve regurgitation Yes  . Pleural effusion   . Cardiac pacemaker   . Chronic heart failure with preserved ejection fraction (CMS/HHS-HCC)   . Postoperative atrial fibrillation (CMS/HHS-HCC)   . OSA on CPAP    Plan   Orders Placed This Encounter  Procedures  . X-ray chest PA and lateral  . Basic Metabolic Panel (BMP)   - Refer cardiac rehab - Stable 2D ECHO. Without dyspnea. 1+ pitting  edema with improvement since surgery, improving weight after surgery. Continue furosemide 20 mg qd. Repeat BMP. Discuss SGLT2i initiation at f/u visit with T2DM and HFpEF.  - Dyspnea resolved. Repeat CXR to evaluate pleural effusion revealed slight interval decrease in moderate left pleural effusion with subadjacent opacity c/w atelectasis vs infection. Pt without cough or fever and with improving dyspnea. SPO2 97% on RA. Will check CBC, low suspicion for infection. Refer to Pulmonologist for further evaluation of persistent moderate pleural effusion. Continue furosemide 20 mg qd. - Antibiotic Prophylaxis: Patient will require antibiotics prior to any dental procedures. Azithromycin 500 mg PO once 30-60 min prior to procedure sent in with refills. Alternative sent, as pharmacy reports amoxicillin allergy. Pt does not. - Continue aspirin  81 mg qd s/p TVR - Continue rosuvastatin  40 mg qd. Last LDL 27 mg/dL.    Return in about 3 months (around 04/10/2024).   Attestation Statement:   I personally performed the service, non-incident to. [facility])   MARY TINNIE LAUNIE MAIDEN, NP

## 2024-01-16 ENCOUNTER — Telehealth: Payer: Self-pay | Admitting: Internal Medicine

## 2024-01-16 NOTE — Telephone Encounter (Signed)
 Copied from CRM [id number]. Topic: Appointments - Scheduling Inquiry for Clinic >> Jan 16, 2024  3:55 PM Shona S wrote: Reason for CRM: patient needs to be contacted to get her scheduled, cardiology called to see if we contacted patient. Please call patient to schedule.   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 Kasa your thoughts? Seen on 12/25/23 by Isaiah

## 2024-01-18 NOTE — Telephone Encounter (Signed)
 Called and spoke with patient.  Gave all information.  Please schedule patient a follow up visit with Dr. Isaiah.

## 2024-01-22 NOTE — Telephone Encounter (Signed)
 See patient message from 01/21/2024. Patient is scheduled for 8/20.  Nothing further needed.

## 2024-01-23 ENCOUNTER — Encounter: Attending: Internal Medicine | Admitting: *Deleted

## 2024-01-23 DIAGNOSIS — I5022 Chronic systolic (congestive) heart failure: Secondary | ICD-10-CM | POA: Insufficient documentation

## 2024-01-23 NOTE — Progress Notes (Signed)
 Initial phone call completed. Diagnosis can be found in [facility] 7/9. EP Orientation scheduled for Wednesday 7/30 at 1:30p.

## 2024-01-24 ENCOUNTER — Ambulatory Visit

## 2024-01-30 ENCOUNTER — Encounter

## 2024-01-30 VITALS — Ht 61.3 in | Wt 138.0 lb

## 2024-01-30 DIAGNOSIS — I5022 Chronic systolic (congestive) heart failure: Secondary | ICD-10-CM

## 2024-01-30 NOTE — Progress Notes (Signed)
 Pulmonary Individual Treatment Plan  Patient Details  Name: Erin Middleton MRN: [id number] Date of Birth: [birth year] Referring Provider:   Conrad Ports Pulmonary Rehab from 01/30/2024 in [facility] Cardiac and Pulmonary Rehab  Referring Provider Florencio Kava, MD    Initial Encounter Date:  Flowsheet Row Pulmonary Rehab from 01/30/2024 in [facility] Cardiac and Pulmonary Rehab  Date 01/30/24    Visit Diagnosis: Heart failure, chronic systolic (HCC)  Patient's Home Medications on Admission:  Current Outpatient Medications:    ACCU-CHEK GUIDE test strip, CHECK BLOOD SUGAR EVERY DAY, Disp: 100 strip, Rfl: 2   Accu-Chek Softclix Lancets lancets, CHECK BLOOD SUGAR ONCE DAILY, Disp: 100 each, Rfl: 12   amLODipine  (NORVASC ) 5 MG tablet, TAKE 1 TABLET BY MOUTH EVERY DAY IN THE EVENING (Patient not taking: Reported on 01/23/2024), Disp: 90 tablet, Rfl: 2   blood glucose meter kit and supplies, Dispense based on patient and insurance preference. Check blood sugar once a day (FOR ICD-10 E10.9, E11.9)., Disp: 1 each, Rfl: 0   Cholecalciferol  10 MCG (400 UNIT) CAPS, , Disp: , Rfl:    furosemide (LASIX) 40 MG tablet, Take 40 mg by mouth. (Patient taking differently: Take 20 mg by mouth daily.), Disp: , Rfl:    metFORMIN  (GLUCOPHAGE ) 500 MG tablet, Take 1 tablet (500 mg total) by mouth 2 (two) times daily with a meal., Disp: 180 tablet, Rfl: 1   Multiple Vitamins-Minerals (PRESERVISION/LUTEIN ) CAPS, Take 1 capsule by mouth at bedtime., Disp: , Rfl:    omeprazole  (PRILOSEC) 20 MG capsule, Take 20 mg by mouth., Disp: , Rfl:    rosuvastatin  (CRESTOR ) 40 MG tablet, Take 1 tablet by mouth at bedtime., Disp: , Rfl:    spironolactone (ALDACTONE) 25 MG tablet, Take 25 mg by mouth daily. (Patient not taking: Reported on 01/23/2024), Disp: , Rfl:    telmisartan (MICARDIS) 80 MG tablet, TAKE 1 TABLET BY MOUTH EVERY DAY (Patient not taking: Reported on 01/23/2024), Disp: 90 tablet, Rfl: 3   vitamin E  400 UNIT capsule, Take  400 Units by mouth daily. 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 breast cancer - chemotherapy   Cancer (HCC)    Breast   Diabetes mellitus without complication (HCC)    GERD (gastroesophageal reflux disease)    Headache    migraines h/o   Hypertension    Personal history of chemotherapy     Tobacco Use: Social History   Tobacco Use  Smoking Status Never  Smokeless Tobacco Never    Labs: Review Flowsheet  More data exists      Latest Ref Rng & Units 03/09/2020 08/03/2020 12/21/2020 05/30/2021 10/27/2021  Labs for ITP Cardiac and Pulmonary Rehab  Cholestrol 100 - 199 mg/dL - 879  - 887  -  LDL (calc) 0 - 99 mg/dL - 47  - 38  -  HDL-C >60 mg/dL - 57  - 60  -  Trlycerides 0 - 149 mg/dL - 81  - 67  -  Hemoglobin A1c 4.8 - 5.6 % 6.5  6.6  7.0  6.8  7.7      Pulmonary Assessment Scores:  Pulmonary Assessment Scores     Row Name 01/30/24 1534         ADL UCSD   ADL Phase Entry     SOB Score total 31     Rest 1     Walk  1     Stairs 4     Bath 0     Dress 0     Shop 3       CAT Score   CAT Score 17       mMRC Score   mMRC Score 3        UCSD: Self-administered rating of dyspnea associated with activities of daily living (ADLs) 6-point scale (0 = not at all to 5 = maximal or unable to do because of breathlessness)  Scoring Scores range from 0 to 120.  Minimally important difference is 5 units  CAT: CAT can identify the health impairment of COPD patients and is better correlated with disease progression.  CAT has a scoring range of zero to 40. The CAT score is classified into four groups of low (less than 10), medium (10 - 20), high (21-30) and very high (31-40) based on the impact level of disease on health status. A CAT score over 10 suggests significant symptoms.  A worsening CAT score could be explained by an exacerbation, poor medication adherence,  poor inhaler technique, or progression of COPD or comorbid conditions.  CAT MCID is 2 points  mMRC: mMRC (Modified Medical Research Council) Dyspnea Scale is used to assess the degree of baseline functional disability in patients of respiratory disease due to dyspnea. No minimal important difference is established. A decrease in score of 1 point or greater is considered a positive change.   Pulmonary Function Assessment:   Exercise Target Goals: Exercise Program Goal: Individual exercise prescription set using results from initial 6 min walk test and THRR while considering  patient's activity barriers and safety.   Exercise Prescription Goal: Initial exercise prescription builds to 30-45 minutes a day of aerobic activity, 2-3 days per week.  Home exercise guidelines will be given to patient during program as part of exercise prescription that the participant will acknowledge.  Education: Aerobic Exercise: - Group verbal and visual presentation on the components of exercise prescription. Introduces F.I.T.T principle from ACSM for exercise prescriptions.  Reviews F.I.T.T. principles of aerobic exercise including progression. Written material given at graduation.   Education: Resistance Exercise: - Group verbal and visual presentation on the components of exercise prescription. Introduces F.I.T.T principle from ACSM for exercise prescriptions  Reviews F.I.T.T. principles of resistance exercise including progression. Written material given at graduation.    Education: Exercise & Equipment Safety: - Individual verbal instruction and demonstration of equipment use and safety with use of the equipment. Flowsheet Row Pulmonary Rehab from 01/30/2024 in [facility] Cardiac and Pulmonary Rehab  Date 01/30/24  Educator MB  Instruction Review Code 1- Verbalizes Understanding    Education: Exercise Physiology & General Exercise Guidelines: - Group verbal and written instruction with models to review the  exercise physiology of the cardiovascular system and associated critical values. Provides general exercise guidelines with specific guidelines to those with heart or lung disease.    Education: Flexibility, Balance, Mind/Body Relaxation: - Group verbal and visual presentation with interactive activity on the components of exercise prescription. Introduces F.I.T.T principle from ACSM for exercise prescriptions. Reviews F.I.T.T. principles of flexibility and balance exercise training including progression. Also discusses the mind body connection.  Reviews various relaxation techniques to help reduce and manage stress (i.e. Deep breathing, progressive muscle relaxation, and visualization). Balance handout provided to take home. Written material given at graduation.   Activity Barriers & Risk Stratification:  Activity Barriers & Cardiac Risk Stratification - 01/30/24 1526  Activity Barriers & Cardiac Risk Stratification   Activity Barriers Back Problems;Shortness of Breath;Balance Concerns;Left Knee Replacement          6 Minute Walk:  6 Minute Walk     Row Name 01/30/24 1524         6 Minute Walk   Phase Initial     Distance 830 feet     Walk Time 6 minutes     # of Rest Breaks 0     MPH 1.57     METS 1.68     RPE 11     Perceived Dyspnea  1     VO2 Peak 5.89     Symptoms Yes (comment)     Comments 6/10 back pain     Resting HR 81 bpm     Resting BP 128/60     Resting Oxygen Saturation  96 %     Exercise Oxygen Saturation  during 6 min walk 96 %     Max Ex. HR 97 bpm     Max Ex. BP 160/80     2 Minute Post BP 138/72       Interval HR   1 Minute HR 89     2 Minute HR 95     3 Minute HR 97     4 Minute HR 94     5 Minute HR 94     6 Minute HR 97     2 Minute Post HR 80     Interval Heart Rate? Yes       Interval Oxygen   Interval Oxygen? Yes     Baseline Oxygen Saturation % 96 %     1 Minute Oxygen Saturation % 97 %     1 Minute Liters of Oxygen 0 L     2  Minute Oxygen Saturation % 97 %     2 Minute Liters of Oxygen 0 L     3 Minute Oxygen Saturation % 98 %     3 Minute Liters of Oxygen 0 L     4 Minute Oxygen Saturation % 96 %     4 Minute Liters of Oxygen 0 L     5 Minute Oxygen Saturation % 96 %     5 Minute Liters of Oxygen 0 L     6 Minute Oxygen Saturation % 96 %     6 Minute Liters of Oxygen 0 L     2 Minute Post Oxygen Saturation % 97 %     2 Minute Post Liters of Oxygen 0 L       Oxygen Initial Assessment:  Oxygen Initial Assessment - 01/23/24 1108       Home Oxygen   Home Oxygen Device None    Sleep Oxygen Prescription None    Home Exercise Oxygen Prescription None    Home Resting Oxygen Prescription None    Compliance with Home Oxygen Use Yes      Intervention   Short Term Goals To learn and understand importance of maintaining oxygen saturations>88%;To learn and demonstrate proper pursed lip breathing techniques or other breathing techniques. ;To learn and demonstrate proper use of respiratory medications;To learn and understand importance of monitoring SPO2 with pulse oximeter and demonstrate accurate use of the pulse oximeter.    Long  Term Goals Verbalizes importance of monitoring SPO2 with pulse oximeter and return demonstration;Maintenance of O2 saturations>88%;Exhibits proper breathing techniques, such as pursed lip breathing or other method taught during program  session;Compliance with respiratory medication          Oxygen Re-Evaluation:   Oxygen Discharge (Final Oxygen Re-Evaluation):   Initial Exercise Prescription:  Initial Exercise Prescription - 01/30/24 1500       Date of Initial Exercise RX and Referring Provider   Date 01/30/24    Referring Provider Florencio Kava, MD      Oxygen   Maintain Oxygen Saturation 88% or higher      Recumbant Bike   Level 1    RPM 50    Watts 15    Minutes 15    METs 1.68      NuStep   Level 1    SPM 80    Minutes 15    METs 1.68      Biostep-RELP    Level 1    SPM 50    Minutes 15    METs 1.68      Track   Laps 20   Hallway   Minutes 15    METs 2.09      Prescription Details   Frequency (times per week) 2    Duration Progress to 30 minutes of continuous aerobic without signs/symptoms of physical distress      Intensity   THRR 40-80% of Max Heartrate 104-128    Ratings of Perceived Exertion 11-13    Perceived Dyspnea 0-4      Progression   Progression Continue to progress workloads to maintain intensity without signs/symptoms of physical distress.      Resistance Training   Training Prescription Yes    Weight 3 lb    Reps 10-15          Perform Capillary Blood Glucose checks as needed.  Exercise Prescription Changes:   Exercise Prescription Changes     Row Name 01/30/24 1500             Response to Exercise   Blood Pressure (Admit) 128/60       Blood Pressure (Exercise) 160/80       Blood Pressure (Exit) 138/72       Heart Rate (Admit) 81 bpm       Heart Rate (Exercise) 97 bpm       Heart Rate (Exit) 80 bpm       Oxygen Saturation (Admit) 96 %       Oxygen Saturation (Exercise) 98 %       Oxygen Saturation (Exit) 97 %       Rating of Perceived Exertion (Exercise) 11       Perceived Dyspnea (Exercise) 1       Symptoms 6/10 back pain       Comments results         Progression   Average METs 1.68          Exercise Comments:   Exercise Goals and Review:   Exercise Goals     Row Name 01/30/24 1532             Exercise Goals   Increase Physical Activity Yes       Intervention Provide advice, education, support and counseling about physical activity/exercise needs.;Develop an individualized exercise prescription for aerobic and resistive training based on initial evaluation findings, risk stratification, comorbidities and participant's personal goals.       Expected Outcomes Short Term: Attend rehab on a regular basis to increase amount of physical activity.;Long Term: Add in home  exercise to make exercise part of routine and to increase amount  of physical activity.;Long Term: Exercising regularly at least 3-5 days a week.       Increase Strength and Stamina Yes       Intervention Provide advice, education, support and counseling about physical activity/exercise needs.;Develop an individualized exercise prescription for aerobic and resistive training based on initial evaluation findings, risk stratification, comorbidities and participant's personal goals.       Expected Outcomes Short Term: Increase workloads from initial exercise prescription for resistance, speed, and METs.;Short Term: Perform resistance training exercises routinely during rehab and add in resistance training at home;Long Term: Improve cardiorespiratory fitness, muscular endurance and strength as measured by increased METs and functional capacity ( )       Able to understand and use rate of perceived exertion (RPE) scale Yes       Intervention Provide education and explanation on how to use RPE scale       Expected Outcomes Short Term: Able to use RPE daily in rehab to express subjective intensity level;Long Term:  Able to use RPE to guide intensity level when exercising independently       Able to understand and use Dyspnea scale Yes       Intervention Provide education and explanation on how to use Dyspnea scale       Expected Outcomes Short Term: Able to use Dyspnea scale daily in rehab to express subjective sense of shortness of breath during exertion;Long Term: Able to use Dyspnea scale to guide intensity level when exercising independently       Knowledge and understanding of Target Heart Rate Range (THRR) Yes       Intervention Provide education and explanation of THRR including how the numbers were predicted and where they are located for reference       Expected Outcomes Short Term: Able to state/look up THRR;Short Term: Able to use daily as guideline for intensity in rehab;Long Term: Able to use  THRR to govern intensity when exercising independently       Able to check pulse independently Yes       Intervention Provide education and demonstration on how to check pulse in carotid and radial arteries.;Review the importance of being able to check your own pulse for safety during independent exercise       Expected Outcomes Long Term: Able to check pulse independently and accurately;Short Term: Able to explain why pulse checking is important during independent exercise       Understanding of Exercise Prescription Yes       Intervention Provide education, explanation, and written materials on patient's individual exercise prescription       Expected Outcomes Short Term: Able to explain program exercise prescription;Long Term: Able to explain home exercise prescription to exercise independently          Exercise Goals Re-Evaluation :   Discharge Exercise Prescription (Final Exercise Prescription Changes):  Exercise Prescription Changes - 01/30/24 1500       Response to Exercise   Blood Pressure (Admit) 128/60    Blood Pressure (Exercise) 160/80    Blood Pressure (Exit) 138/72    Heart Rate (Admit) 81 bpm    Heart Rate (Exercise) 97 bpm    Heart Rate (Exit) 80 bpm    Oxygen Saturation (Admit) 96 %    Oxygen Saturation (Exercise) 98 %    Oxygen Saturation (Exit) 97 %    Rating of Perceived Exertion (Exercise) 11    Perceived Dyspnea (Exercise) 1    Symptoms 6/10 back pain  Comments results      Progression   Average METs 1.68          Nutrition:  Target Goals: Understanding of nutrition guidelines, daily intake of sodium 1500mg , cholesterol 200mg , calories 30% from fat and 7% or less from saturated fats, daily to have 5 or more servings of fruits and vegetables.  Education: All About Nutrition: -Group instruction provided by verbal, written material, interactive activities, discussions, models, and posters to present general guidelines for heart healthy nutrition  including fat, fiber, MyPlate, the role of sodium in heart healthy nutrition, utilization of the nutrition label, and utilization of this knowledge for meal planning. Follow up email sent as well. Written material given at graduation. Flowsheet Row Pulmonary Rehab from 01/30/2024 in [facility] Cardiac and Pulmonary Rehab  Education need identified 01/30/24    Biometrics:  Pre Biometrics - 01/30/24 1533       Pre Biometrics   Height 5' 1.3 (1.557 m)    Weight 138 lb (62.6 kg)    Waist Circumference 27.5 inches    Hip Circumference 42.8 inches    Waist to Hip Ratio 0.64 %    BMI (Calculated) 25.82    Single Leg Stand 4 seconds           Nutrition Therapy Plan and Nutrition Goals:  Nutrition Therapy & Goals - 01/30/24 1535       Nutrition Therapy   RD appointment deferred Yes      Personal Nutrition Goals   Nutrition Goal RD appointment deferred at this time      Intervention Plan   Intervention Prescribe, educate and counsel regarding individualized specific dietary modifications aiming towards targeted core components such as weight, hypertension, lipid management, diabetes, heart failure and other comorbidities.    Expected Outcomes Short Term Goal: Understand basic principles of dietary content, such as calories, fat, sodium, cholesterol and nutrients.          Nutrition Assessments:  MEDIFICTS Score Key: >=70 Need to make dietary changes  40-70 Heart Healthy Diet <= 40 Therapeutic Level Cholesterol Diet  Flowsheet Row Pulmonary Rehab from 01/30/2024 in [facility] Cardiac and Pulmonary Rehab  Picture Your Plate Total Score on Admission 53   Picture Your Plate Scores: <59 Unhealthy dietary pattern with much room for improvement. 41-50 Dietary pattern unlikely to meet recommendations for good health and room for improvement. 51-60 More healthful dietary pattern, with some room for improvement.  >60 Healthy dietary pattern, although there may be some specific behaviors that  could be improved.   Nutrition Goals Re-Evaluation:   Nutrition Goals Discharge (Final Nutrition Goals Re-Evaluation):   Psychosocial: Target Goals: Acknowledge presence or absence of significant depression and/or stress, maximize coping skills, provide positive support system. Participant is able to verbalize types and ability to use techniques and skills needed for reducing stress and depression.   Education: Stress, Anxiety, and Depression - Group verbal and visual presentation to define topics covered.  Reviews how body is impacted by stress, anxiety, and depression.  Also discusses healthy ways to reduce stress and to treat/manage anxiety and depression.  Written material given at graduation.   Education: Sleep Hygiene -Provides group verbal and written instruction about how sleep can affect your health.  Define sleep hygiene, discuss sleep cycles and impact of sleep habits. Review good sleep hygiene tips.    Initial Review & Psychosocial Screening:  Initial Psych Review & Screening - 01/23/24 1046       Initial Review   Current issues  with None Identified      Family Dynamics   Good Support System? Yes      Barriers   Psychosocial barriers to participate in program There are no identifiable barriers or psychosocial needs.;The patient should benefit from training in stress management and relaxation.      Screening Interventions   Interventions Encouraged to exercise;To provide support and resources with identified psychosocial needs    Expected Outcomes Short Term goal: Utilizing psychosocial counselor, staff and physician to assist with identification of specific Stressors or current issues interfering with healing process. Setting desired goal for each stressor or current issue identified.;Long Term Goal: Stressors or current issues are controlled or eliminated.;Short Term goal: Identification and review with participant of any Quality of Life or Depression concerns found by  scoring the questionnaire.;Long Term goal: The participant improves quality of Life and PHQ9 Scores as seen by post scores and/or verbalization of changes          Quality of Life Scores:  Scores of 19 and below usually indicate a poorer quality of life in these areas.  A difference of  2-3 points is a clinically meaningful difference.  A difference of 2-3 points in the total score of the Quality of Life Index has been associated with significant improvement in overall quality of life, self-image, physical symptoms, and general health in studies assessing change in quality of life.  PHQ-9: Review Flowsheet  More data exists      01/30/2024 10/27/2021 05/30/2021 08/03/2020 07/14/2019  Depression screen PHQ 2/9  Decreased Interest 1 1 0 1 0 0  Down, Depressed, Hopeless 0 1 1 1  0 0  PHQ - 2 Score 1 2 1 2  0 0  Altered sleeping 1 1 0 1 -  Tired, decreased energy 1 1 2 2  -  Change in appetite 1 0 0 1 -  Feeling bad or failure about yourself  0 0 0 0 -  Trouble concentrating 1 1 0 0 -  Moving slowly or fidgety/restless 1 0 0 0 -  Suicidal thoughts 0 0 0 0 -  PHQ-9 Score 6 5 3 6  -  Difficult doing work/chores Somewhat difficult Somewhat difficult Not difficult at all Not difficult at all -    Details       Multiple values from one day are sorted in reverse-chronological order        Interpretation of Total Score  Total Score Depression Severity:  1-4 = Minimal depression, 5-9 = Mild depression, 10-14 = Moderate depression, 15-19 = Moderately severe depression, 20-27 = Severe depression   Psychosocial Evaluation and Intervention:  Psychosocial Evaluation - 01/23/24 1058       Psychosocial Evaluation & Interventions   Comments Erin Middleton is coming to the program with systolic heart failure. She notes her shortness of breath has been her main issue, but can tell a difference with her medication now. She has issue with  her vision, so she no longer drives and relies on her daughter for  transportation. When asked about stress concerns, she mentions currently she feels like she is doing okay. She is looking forward to attending the program to work on her stamina and strength    Expected Outcomes Short: attend the program for education and exercise Long: develop and maintain positive self care habits.    Continue Psychosocial Services  Follow up required by staff          Psychosocial Re-Evaluation:   Psychosocial Discharge (Final Psychosocial Re-Evaluation):   Education:  Education Goals: Education classes will be provided on a weekly basis, covering required topics. Participant will state understanding/return demonstration of topics presented.  Learning Barriers/Preferences:  Learning Barriers/Preferences - 01/23/24 1046       Learning Barriers/Preferences   Learning Barriers Sight    Learning Preferences None          General Pulmonary Education Topics:  Infection Prevention: - Provides verbal and written material to individual with discussion of infection control including proper hand washing and proper equipment cleaning during exercise session. Flowsheet Row Pulmonary Rehab from 01/30/2024 in [facility] Cardiac and Pulmonary Rehab  Date 01/30/24  Educator MB  Instruction Review Code 1- Verbalizes Understanding    Falls Prevention: - Provides verbal and written material to individual with discussion of falls prevention and safety. Flowsheet Row Pulmonary Rehab from 01/30/2024 in [facility] Cardiac and Pulmonary Rehab  Date 01/30/24  Educator MB  Instruction Review Code 1- Verbalizes Understanding    Chronic Lung Disease Review: - Group verbal instruction with posters, models, PowerPoint presentations and videos,  to review new updates, new respiratory medications, new advancements in procedures and treatments. Providing information on websites and 800 numbers for continued self-education. Includes information about supplement oxygen, available portable oxygen  systems, continuous and intermittent flow rates, oxygen safety, concentrators, and Medicare reimbursement for oxygen. Explanation of Pulmonary Drugs, including class, frequency, complications, importance of spacers, rinsing mouth after steroid MDI's, and proper cleaning methods for nebulizers. Review of basic lung anatomy and physiology related to function, structure, and complications of lung disease. Review of risk factors. Discussion about methods for diagnosing sleep apnea and types of masks and machines for OSA. Includes a review of the use of types of environmental controls: home humidity, furnaces, filters, dust mite/pet prevention, HEPA vacuums. Discussion about weather changes, air quality and the benefits of nasal washing. Instruction on Warning signs, infection symptoms, calling MD promptly, preventive modes, and value of vaccinations. Review of effective airway clearance, coughing and/or vibration techniques. Emphasizing that all should Create an Action Plan. Written material given at graduation.   AED/CPR: - Group verbal and written instruction with the use of models to demonstrate the basic use of the AED with the basic ABC's of resuscitation.    Anatomy and Cardiac Procedures: - Group verbal and visual presentation and models provide information about basic cardiac anatomy and function. Reviews the testing methods done to diagnose heart disease and the outcomes of the test results. Describes the treatment choices: Medical Management, Angioplasty, or Coronary Bypass Surgery for treating various heart conditions including Myocardial Infarction, Angina, Valve Disease, and Cardiac Arrhythmias.  Written material given at graduation.   Medication Safety: - Group verbal and visual instruction to review commonly prescribed medications for heart and lung disease. Reviews the medication, class of the drug, and side effects. Includes the steps to properly store meds and maintain the prescription  regimen.  Written material given at graduation.   Other: -Provides group and verbal instruction on various topics (see comments)   Knowledge Questionnaire Score:  Knowledge Questionnaire Score - 01/30/24 1535       Knowledge Questionnaire Score   Pre Score 25/26           Core Components/Risk Factors/Patient Goals at Admission:  Personal Goals and Risk Factors at Admission - 01/30/24 1536       Core Components/Risk Factors/Patient Goals on Admission    Weight Management Yes;Weight Maintenance    Intervention Weight Management: Develop a combined nutrition and exercise program designed to reach desired  caloric intake, while maintaining appropriate intake of nutrient and fiber, sodium and fats, and appropriate energy expenditure required for the weight goal.;Weight Management: Provide education and appropriate resources to help participant work on and attain dietary goals.;Weight Management/Obesity: Establish reasonable short term and long term weight goals.    Admit Weight 138 lb (62.6 kg)    Goal Weight: Short Term 138 lb (62.6 kg)    Goal Weight: Long Term 138 lb (62.6 kg)    Expected Outcomes Weight Maintenance: Understanding of the daily nutrition guidelines, which includes 25-35% calories from fat, 7% or less cal from saturated fats, less than 200mg  cholesterol, less than 1.5gm of sodium, & 5 or more servings of fruits and vegetables daily;Understanding recommendations for meals to include 15-35% energy as protein, 25-35% energy from fat, 35-60% energy from carbohydrates, less than 200mg  of dietary cholesterol, 20-35 gm of total fiber daily;Understanding of distribution of calorie intake throughout the day with the consumption of 4-5 meals/snacks;Short Term: Continue to assess and modify interventions until short term weight is achieved;Long Term: Adherence to nutrition and physical activity/exercise program aimed toward attainment of established weight goal    Diabetes Yes     Intervention Provide education about signs/symptoms and action to take for hypo/hyperglycemia.;Provide education about proper nutrition, including hydration, and aerobic/resistive exercise prescription along with prescribed medications to achieve blood glucose in normal ranges: Fasting glucose 65-99 mg/dL    Expected Outcomes Short Term: Participant verbalizes understanding of the signs/symptoms and immediate care of hyper/hypoglycemia, proper foot care and importance of medication, aerobic/resistive exercise and nutrition plan for blood glucose control.;Long Term: Attainment of HbA1C < 7%.    Heart Failure Yes    Intervention Provide a combined exercise and nutrition program that is supplemented with education, support and counseling about heart failure. Directed toward relieving symptoms such as shortness of breath, decreased exercise tolerance, and extremity edema.    Expected Outcomes Improve functional capacity of life;Short term: Attendance in program 2-3 days a week with increased exercise capacity. Reported lower sodium intake. Reported increased fruit and vegetable intake. Reports medication compliance.;Short term: Daily weights obtained and reported for increase. Utilizing diuretic protocols set by physician.;Long term: Adoption of self-care skills and reduction of barriers for early signs and symptoms recognition and intervention leading to self-care maintenance.    Hypertension Yes    Intervention Provide education on lifestyle modifcations including regular physical activity/exercise, weight management, moderate sodium restriction and increased consumption of fresh fruit, vegetables, and low fat dairy, alcohol moderation, and smoking cessation.;Monitor prescription use compliance.    Expected Outcomes Short Term: Continued assessment and intervention until BP is < 140/65mm HG in hypertensive participants. < 130/88mm HG in hypertensive participants with diabetes, heart failure or chronic kidney  disease.;Long Term: Maintenance of blood pressure at goal levels.    Lipids Yes    Intervention Provide education and support for participant on nutrition & aerobic/resistive exercise along with prescribed medications to achieve LDL 70mg , HDL >40mg .    Expected Outcomes Short Term: Participant states understanding of desired cholesterol values and is compliant with medications prescribed. Participant is following exercise prescription and nutrition guidelines.;Long Term: Cholesterol controlled with medications as prescribed, with individualized exercise RX and with personalized nutrition plan. Value goals: LDL < 70mg , HDL > 40 mg.          Education:Diabetes - Individual verbal and written instruction to review signs/symptoms of diabetes, desired ranges of glucose level fasting, after meals and with exercise. Acknowledge that pre and post exercise glucose checks  will be done for 3 sessions at entry of program. Flowsheet Row Pulmonary Rehab from 01/30/2024 in [facility]  Date 01/30/24  Educator MB  Instruction Review Code 1- Verbalizes Understanding    Know Your Numbers and Heart Failure: - Group verbal and visual instruction to discuss disease risk factors for cardiac and pulmonary disease and treatment options.  Reviews associated critical values for Overweight/Obesity, Hypertension, Cholesterol, and Diabetes.  Discusses basics of heart failure: signs/symptoms and treatments.  Introduces Heart Failure Zone chart for action plan for heart failure.  Written material given at graduation.   Core Components/Risk Factors/Patient Goals Review:    Core Components/Risk Factors/Patient Goals at Discharge (Final Review):    ITP Comments:  ITP Comments     Row Name 01/23/24 1058 01/30/24 1524         ITP Comments Initial phone call completed. Diagnosis can be found in [facility] 7/9. EP Orientation scheduled for Wednesday 7/30 at 1:30p. Completed and gym orientation for  respiratory care services. Initial ITP created and sent for review to Dr. Faud Aleskerov, Medical Director.         Comments: Initial ITP

## 2024-01-30 NOTE — Patient Instructions (Signed)
 Patient Instructions  Patient Details  Name: Erin Middleton MRN: [id number] Date of Birth: 1945/09/05 Referring Provider:  Florencio Cara BIRCH, MD  Below are your personal goals for exercise, nutrition, and risk factors. Our goal is to help you stay on track towards obtaining and maintaining these goals. We will be discussing your progress on these goals with you throughout the program.  Initial Exercise Prescription:  Initial Exercise Prescription - 01/30/24 1500       Date of Initial Exercise RX and Referring Provider   Date 01/30/24    Referring Provider Florencio Cara, MD      Oxygen   Maintain Oxygen Saturation 88% or higher      Recumbant Bike   Level 1    RPM 50    Watts 15    Minutes 15    METs 1.68      NuStep   Level 1    SPM 80    Minutes 15    METs 1.68      Biostep-RELP   Level 1    SPM 50    Minutes 15    METs 1.68      Track   Laps 20   Hallway   Minutes 15    METs 2.09      Prescription Details   Frequency (times per week) 2    Duration Progress to 30 minutes of continuous aerobic without signs/symptoms of physical distress      Intensity   THRR 40-80% of Max Heartrate 104-128    Ratings of Perceived Exertion 11-13    Perceived Dyspnea 0-4      Progression   Progression Continue to progress workloads to maintain intensity without signs/symptoms of physical distress.      Resistance Training   Training Prescription Yes    Weight 3 lb    Reps 10-15          Exercise Goals: Frequency: Be able to perform aerobic exercise two to three times per week in program working toward 2-5 days per week of home exercise.  Intensity: Work with a perceived exertion of 11 (fairly light) - 15 (hard) while following your exercise prescription.  We will make changes to your prescription with you as you progress through the program.   Duration: Be able to do 30 to 45 minutes of continuous aerobic exercise in addition to a 5 minute warm-up and a 5 minute  cool-down routine.   Nutrition Goals: Your personal nutrition goals will be established when you do your nutrition analysis with the dietician.  The following are general nutrition guidelines to follow: Cholesterol < 200mg /day Sodium < 1500mg /day Fiber: Women over 50 yrs - 21 grams per day  Personal Goals:  Personal Goals and Risk Factors at Admission - 01/30/24 1536       Core Components/Risk Factors/Patient Goals on Admission    Weight Management Yes;Weight Maintenance    Intervention Weight Management: Develop a combined nutrition and exercise program designed to reach desired caloric intake, while maintaining appropriate intake of nutrient and fiber, sodium and fats, and appropriate energy expenditure required for the weight goal.;Weight Management: Provide education and appropriate resources to help participant work on and attain dietary goals.;Weight Management/Obesity: Establish reasonable short term and long term weight goals.    Admit Weight 138 lb (62.6 kg)    Goal Weight: Short Term 138 lb (62.6 kg)    Goal Weight: Long Term 138 lb (62.6 kg)    Expected Outcomes Weight Maintenance: Understanding  of the daily nutrition guidelines, which includes 25-35% calories from fat, 7% or less cal from saturated fats, less than 200mg  cholesterol, less than 1.5gm of sodium, & 5 or more servings of fruits and vegetables daily;Understanding recommendations for meals to include 15-35% energy as protein, 25-35% energy from fat, 35-60% energy from carbohydrates, less than 200mg  of dietary cholesterol, 20-35 gm of total fiber daily;Understanding of distribution of calorie intake throughout the day with the consumption of 4-5 meals/snacks;Short Term: Continue to assess and modify interventions until short term weight is achieved;Long Term: Adherence to nutrition and physical activity/exercise program aimed toward attainment of established weight goal    Diabetes Yes    Intervention Provide education  about signs/symptoms and action to take for hypo/hyperglycemia.;Provide education about proper nutrition, including hydration, and aerobic/resistive exercise prescription along with prescribed medications to achieve blood glucose in normal ranges: Fasting glucose 65-99 mg/dL    Expected Outcomes Short Term: Participant verbalizes understanding of the signs/symptoms and immediate care of hyper/hypoglycemia, proper foot care and importance of medication, aerobic/resistive exercise and nutrition plan for blood glucose control.;Long Term: Attainment of HbA1C < 7%.    Heart Failure Yes    Intervention Provide a combined exercise and nutrition program that is supplemented with education, support and counseling about heart failure. Directed toward relieving symptoms such as shortness of breath, decreased exercise tolerance, and extremity edema.    Expected Outcomes Improve functional capacity of life;Short term: Attendance in program 2-3 days a week with increased exercise capacity. Reported lower sodium intake. Reported increased fruit and vegetable intake. Reports medication compliance.;Short term: Daily weights obtained and reported for increase. Utilizing diuretic protocols set by physician.;Long term: Adoption of self-care skills and reduction of barriers for early signs and symptoms recognition and intervention leading to self-care maintenance.    Hypertension Yes    Intervention Provide education on lifestyle modifcations including regular physical activity/exercise, weight management, moderate sodium restriction and increased consumption of fresh fruit, vegetables, and low fat dairy, alcohol moderation, and smoking cessation.;Monitor prescription use compliance.    Expected Outcomes Short Term: Continued assessment and intervention until BP is < 140/51mm HG in hypertensive participants. < 130/70mm HG in hypertensive participants with diabetes, heart failure or chronic kidney disease.;Long Term: Maintenance  of blood pressure at goal levels.    Lipids Yes    Intervention Provide education and support for participant on nutrition & aerobic/resistive exercise along with prescribed medications to achieve LDL 70mg , HDL >40mg .    Expected Outcomes Short Term: Participant states understanding of desired cholesterol values and is compliant with medications prescribed. Participant is following exercise prescription and nutrition guidelines.;Long Term: Cholesterol controlled with medications as prescribed, with individualized exercise RX and with personalized nutrition plan. Value goals: LDL < 70mg , HDL > 40 mg.          Tobacco Use Initial Evaluation: Social History   Tobacco Use  Smoking Status Never  Smokeless Tobacco Never    Exercise Goals and Review:  Exercise Goals     Row Name 01/30/24 1532             Exercise Goals   Increase Physical Activity Yes       Intervention Provide advice, education, support and counseling about physical activity/exercise needs.;Develop an individualized exercise prescription for aerobic and resistive training based on initial evaluation findings, risk stratification, comorbidities and participant's personal goals.       Expected Outcomes Short Term: Attend rehab on a regular basis to increase amount of physical activity.;Long Term:  Add in home exercise to make exercise part of routine and to increase amount of physical activity.;Long Term: Exercising regularly at least 3-5 days a week.       Increase Strength and Stamina Yes       Intervention Provide advice, education, support and counseling about physical activity/exercise needs.;Develop an individualized exercise prescription for aerobic and resistive training based on initial evaluation findings, risk stratification, comorbidities and participant's personal goals.       Expected Outcomes Short Term: Increase workloads from initial exercise prescription for resistance, speed, and METs.;Short Term: Perform  resistance training exercises routinely during rehab and add in resistance training at home;Long Term: Improve cardiorespiratory fitness, muscular endurance and strength as measured by increased METs and functional capacity ( )       Able to understand and use rate of perceived exertion (RPE) scale Yes       Intervention Provide education and explanation on how to use RPE scale       Expected Outcomes Short Term: Able to use RPE daily in rehab to express subjective intensity level;Long Term:  Able to use RPE to guide intensity level when exercising independently       Able to understand and use Dyspnea scale Yes       Intervention Provide education and explanation on how to use Dyspnea scale       Expected Outcomes Short Term: Able to use Dyspnea scale daily in rehab to express subjective sense of shortness of breath during exertion;Long Term: Able to use Dyspnea scale to guide intensity level when exercising independently       Knowledge and understanding of Target Heart Rate Range (THRR) Yes       Intervention Provide education and explanation of THRR including how the numbers were predicted and where they are located for reference       Expected Outcomes Short Term: Able to state/look up THRR;Short Term: Able to use daily as guideline for intensity in rehab;Long Term: Able to use THRR to govern intensity when exercising independently       Able to check pulse independently Yes       Intervention Provide education and demonstration on how to check pulse in carotid and radial arteries.;Review the importance of being able to check your own pulse for safety during independent exercise       Expected Outcomes Long Term: Able to check pulse independently and accurately;Short Term: Able to explain why pulse checking is important during independent exercise       Understanding of Exercise Prescription Yes       Intervention Provide education, explanation, and written materials on patient's individual  exercise prescription       Expected Outcomes Short Term: Able to explain program exercise prescription;Long Term: Able to explain home exercise prescription to exercise independently

## 2024-02-06 ENCOUNTER — Encounter: Attending: Internal Medicine

## 2024-02-06 DIAGNOSIS — I5022 Chronic systolic (congestive) heart failure: Secondary | ICD-10-CM | POA: Insufficient documentation

## 2024-02-06 LAB — GLUCOSE, CAPILLARY
Glucose-Capillary: 154 mg/dL — ABNORMAL HIGH (ref 70–99)
Glucose-Capillary: 146 mg/dL — ABNORMAL HIGH (ref 70–99)

## 2024-02-06 NOTE — Progress Notes (Signed)
 Daily Session Note  Patient Details  Name: Erin Middleton MRN: [id number] Date of Birth: 09-29-1945 Referring Provider:   Conrad Ports Pulmonary Rehab from 01/30/2024 in [facility] Pul[facility] Re[facility]  Referring Provider Florencio Kava, MD    Encounter Date: 02/06/2024  Check In:  Session Check In - 02/06/24 0806       Check-In   Supervising physician immediately available to respond to emergencies See telemetry face sheet for immediately available ER MD    Location [facility]-Cardiac & Pulmonary Rehab    Staff Present Burnard Davenport RN,BSN,MPA;Joseph [place] RCP,RRT,BSRT;Margaret Best, MS, Exercise Physiologist;Jason Elnor [facility]    Virtual Visit No    Medication changes reported     No    Fall or balance concerns reported    No    Warm-up and Cool-down Performed on first and last piece of equipment    Resistance Training Performed Yes    VAD Patient? No    PAD/SET Patient? No      Pain Assessment   Currently in Pain? No/denies             Social History   Tobacco Use  Smoking Status Never  Smokeless Tobacco Never    Goals Met:  Independence with exercise equipment Exercise tolerated well No report of concerns or symptoms today Strength training completed today  Goals Unmet:  Not Applicable  Comments: First full day of exercise!  Patient was oriented to gym and equipment including functions, settings, policies, and procedures.  Patient's individual exercise prescription and treatment plan were reviewed.  All starting workloads were established based on the results of the 6 minute walk test done at initial orientation visit.  The plan for exercise progression was also introduced and progression will be customized based on patient's performance and goals.     Dr. Oneil Pinal is Medical Director for [facility] Cardiac Rehabilitation.  Dr. Fuad Aleskerov is Medical Director for [facility] Pulmonary Rehabilitation.

## 2024-02-11 ENCOUNTER — Encounter

## 2024-02-11 DIAGNOSIS — I5022 Chronic systolic (congestive) heart failure: Secondary | ICD-10-CM

## 2024-02-11 LAB — GLUCOSE, CAPILLARY
Glucose-Capillary: 129 mg/dL — ABNORMAL HIGH (ref 70–99)
Glucose-Capillary: 178 mg/dL — ABNORMAL HIGH (ref 70–99)

## 2024-02-11 NOTE — Progress Notes (Signed)
 Daily Session Note  Patient Details  Name: Erin Middleton MRN: [id number] Date of Birth: 1946-04-15 Referring Provider:   Conrad Ports Pulmonary Rehab from 01/30/2024 in [facility] Cardiac and Pulmonary Rehab  Referring Provider Florencio Kava, MD    Encounter Date: 02/11/2024  Check In:  Session Check In - 02/11/24 0752       Check-In   Supervising physician immediately available to respond to emergencies See telemetry face sheet for immediately available ER MD    Location [facility]-Cardiac & Pulmonary Rehab    Staff Present Burnard Davenport RN,BSN,MPA;Joseph [facility] Dyane BS, ACSM CEP, Exercise Physiologist;Jason Elnor RDN,LDN    Virtual Visit No    Medication changes reported     No    Fall or balance concerns reported    No    Warm-up and Cool-down Performed on first and last piece of equipment    Resistance Training Performed Yes    VAD Patient? No    PAD/SET Patient? No      Pain Assessment   Currently in Pain? No/denies             Social History   Tobacco Use  Smoking Status Never  Smokeless Tobacco Never    Goals Met:  Proper associated with RPD/PD & O2 Sat Independence with exercise equipment Using PLB without cueing & demonstrates good technique Exercise tolerated well No report of concerns or symptoms today Strength training completed today  Goals Unmet:  Not Applicable  Comments: Pt able to follow exercise prescription today without complaint.  Will continue to monitor for progression.    Dr. Oneil Pinal is Medical Director for [facility] Cardiac Rehabilitation.  Dr. Fuad Aleskerov is Medical Director for [facility] Pulmonary Rehabilitation.

## 2024-02-13 ENCOUNTER — Encounter

## 2024-02-13 DIAGNOSIS — I5022 Chronic systolic (congestive) heart failure: Secondary | ICD-10-CM | POA: Diagnosis not present

## 2024-02-13 LAB — GLUCOSE, CAPILLARY: Glucose-Capillary: 133 mg/dL — ABNORMAL HIGH (ref 70–99)

## 2024-02-13 NOTE — Progress Notes (Signed)
 Daily Session Note  Patient Details  Name: Erin Middleton MRN: [id number] Date of Birth: 1946/03/01 Referring Provider:   Conrad Ports Pulmonary Rehab from 01/30/2024 in [facility] Cardiac and Pulmonary Rehab  Referring Provider Florencio Kava, MD    Encounter Date: 02/13/2024  Check In:  Session Check In - 02/13/24 0757       Check-In   Supervising physician immediately available to respond to emergencies See telemetry face sheet for immediately available ER MD    Location [facility]-Cardiac & Pulmonary Rehab    Staff Present Burnard Davenport RN,BSN,MPA;Joseph [facility] RCP,RRT,BSRT;Margaret Best, MS, Exercise Physiologist;Jason Elnor RDN,LDN;Noah Tickle, BS, Exercise Physiologist    Virtual Visit No    Medication changes reported     No    Fall or balance concerns reported    No    Warm-up and Cool-down Performed on first and last piece of equipment    Resistance Training Performed Yes    VAD Patient? No    PAD/SET Patient? No      Pain Assessment   Currently in Pain? No/denies             Social History   Tobacco Use  Smoking Status Never  Smokeless Tobacco Never    Goals Met:  Proper associated with RPD/PD & O2 Sat Independence with exercise equipment Using PLB without cueing & demonstrates good technique Exercise tolerated well No report of concerns or symptoms today Strength training completed today  Goals Unmet:  Not Applicable  Comments: Pt able to follow exercise prescription today without complaint.  Will continue to monitor for progression.    Dr. Oneil Pinal is Medical Director for [facility] Cardiac Rehabilitation.  Dr. Fuad Aleskerov is Medical Director for [facility] Pulmonary Rehabilitation.

## 2024-02-18 ENCOUNTER — Encounter

## 2024-02-18 DIAGNOSIS — I5022 Chronic systolic (congestive) heart failure: Secondary | ICD-10-CM | POA: Diagnosis not present

## 2024-02-18 NOTE — Progress Notes (Signed)
 Daily Session Note  Patient Details  Name: Erin Middleton MRN: [id number] Date of Birth: 02-07-1946 Referring Provider:   Conrad Ports Pulmonary Rehab from 01/30/2024 in [facility] Cardiac and Pulmonary Rehab  Referring Provider Florencio Kava, MD    Encounter Date: 02/18/2024  Check In:  Session Check In - 02/18/24 0732       Check-In   Supervising physician immediately available to respond to emergencies See telemetry face sheet for immediately available ER MD    Location [facility]-Cardiac & Pulmonary Rehab    Staff Present Burnard Davenport RN,BSN,MPA;Joseph [facility] Dyane BS, ACSM CEP, Exercise Physiologist;Jason Elnor RDN,LDN    Virtual Visit No    Medication changes reported     No    Fall or balance concerns reported    No    Warm-up and Cool-down Performed on first and last piece of equipment    Resistance Training Performed Yes    VAD Patient? No    PAD/SET Patient? No      Pain Assessment   Currently in Pain? No/denies             Social History   Tobacco Use  Smoking Status Never  Smokeless Tobacco Never    Goals Met:  Proper associated with RPD/PD & O2 Sat Independence with exercise equipment Using PLB without cueing & demonstrates good technique Exercise tolerated well No report of concerns or symptoms today Strength training completed today  Goals Unmet:  Not Applicable  Comments: Pt able to follow exercise prescription today without complaint.  Will continue to monitor for progression.    Dr. Oneil Pinal is Medical Director for [facility] Cardiac Rehabilitation.  Dr. Fuad Aleskerov is Medical Director for [facility] Pulmonary Rehabilitation.

## 2024-02-19 ENCOUNTER — Ambulatory Visit: Admitting: Internal Medicine

## 2024-02-19 ENCOUNTER — Encounter: Payer: Self-pay | Admitting: Internal Medicine

## 2024-02-19 VITALS — BP 110/60 | HR 84 | Temp 97.8°F | Ht 61.5 in | Wt 135.0 lb

## 2024-02-19 DIAGNOSIS — G4733 Obstructive sleep apnea (adult) (pediatric): Secondary | ICD-10-CM

## 2024-02-19 NOTE — Patient Instructions (Addendum)
 Excellent Job A+ GOLD STAR!!  Continue CPAP as prescribed  Patient Instructions Continue to use CPAP every night, minimum of 4-6 hours a night.  Change equipment every 30 days or as directed by DME.  Wash your tubing with warm soap and water daily, hang to dry. Wash humidifier portion weekly. Use bottled, distilled water and change daily   Be aware of reduced alertness and do not drive or operate heavy machinery if experiencing this or drowsiness.  Exercise encouraged, as tolerated. Encouraged proper weight management.  Important to get eight or more hours of sleep  Limiting the use of the computer and television before bedtime.  Decrease naps during the day, so night time sleep will become enhanced.  Limit caffeine, and sleep deprivation.    Avoid Allergens and Irritants Avoid secondhand smoke Avoid SICK contacts Recommend  Masking  when appropriate Recommend Keep up-to-date with vaccinations  Continue to use incentive spirometry breathing exercises 5-10 times per day Follow-up with cardiology for further assessment with diuretics

## 2024-02-19 NOTE — Progress Notes (Signed)
 @Patient  ID: Erin Middleton, female    DOB: 1945-12-13, 78 y.o.   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  Encouraged proper weight management.  Discussed driving precautions and its relationship with hypersomnolence.  Discussed sleep hygiene, and benefits of a fixed sleep waked time.  The importance of getting eight or more hours of sleep discussed with patient.  Discussed limiting the use of the computer and television before bedtime.  Decrease naps during the day, so night time sleep will become enhanced.  Limit caffeine, and sleep deprivation.   Patient uses and benefits from therapy Using CPAP nightly and with naps Pressure setting is comfortable and is sleeping well. Pressure setting is comfortable and is sleeping well. Auto CPAP 5-20  No exacerbation at this time No evidence of heart failure at this time No evidence or signs of infection at this time No respiratory distress No fevers, chills, nausea, vomiting, diarrhea No evidence of lower extremity edema No evidence hemoptysis   Status post AV repair Postop respiratory failure in the ICU for 3 and half weeks History of pleural effusions status post thoracentesis status post chest tube Patient currently on Lasix continue as tolerated and as per cardiology Continue incentive spirometry 5-10 times per day No significant shortness of breath at this time Patient does not have any respiratory compromise from previous history of pleural effusion   Ambulating  pulse oximetry in the office did not show any significant hypoxia from previous office visit      CBC    Component Value Date/Time   WBC 8.9 01/17/2021 1603   WBC 7.4 12/26/2020 1102   RBC 4.24 01/17/2021 1603   RBC 4.11 12/26/2020 1102   HGB 13.4 01/17/2021 1603   HCT 40.4 01/17/2021 1603   PLT 231 01/17/2021 1603   MCV 95 01/17/2021 1603   MCH 31.6 01/17/2021 1603   MCH 32.1 12/26/2020 1102   MCHC 33.2 01/17/2021 1603   MCHC 34.7 12/26/2020 1102   RDW 12.8 01/17/2021 1603   LYMPHSABS 2.5 01/17/2021 1603   MONOABS 0.4 12/26/2020 1102   EOSABS 0.4 01/17/2021 1603   BASOSABS 0.0 01/17/2021 1603    BMET    Component Value Date/Time   NA 141 10/27/2021 0935   K 4.2 10/27/2021 0935   CL 102 10/27/2021 0935   CO2 22 10/27/2021 0935   GLUCOSE 201 (H) 10/27/2021 0935   GLUCOSE 136 (H) 12/28/2020 0528   BUN 19 10/27/2021 0935   CREATININE 1.03 (H) 10/27/2021 0935   CREATININE 0.94 (H) 06/06/2017 1120   CALCIUM  9.3 10/27/2021 0935   GFRNONAA >60 12/28/2020 0528   GFRNONAA 60 06/06/2017 1120   GFRAA 66 08/03/2020 1120   GFRAA 70 06/06/2017 1120    BNP    Component Value Date/Time   BNP 81.0 09/19/2018 1120      Allergies  Allergen Reactions   Chocolate Other (See Comments)    Per patient and daughter    Immunization History  Administered Date(s) Administered  Fluad Quad(high Dose 65+) 04/15/2019, 05/19/2020, 05/04/2021   Influenza, High Dose Seasonal PF 05/10/2016, 06/06/2017, 03/07/2018   Influenza, Mdck, Trivalent,PF 6+ MOS(egg free) 03/22/2023   Influenza-Unspecified 04/09/2015   PFIZER(Purple Top)SARS-COV-2 Vaccination 07/26/2019, 08/18/2019, 06/01/2020   Pneumococcal Conjugate-13 05/10/2016   Pneumococcal Polysaccharide-23 03/06/2012   Td 01/23/2003    Past Medical History:  Diagnosis Date   Arthritis    Breast cancer (HCC) 1985   left breast cancer - chemotherapy   Cancer (HCC)    Breast   Diabetes mellitus without complication (HCC)     GERD (gastroesophageal reflux disease)    Headache    migraines h/o   Hypertension    Personal history of chemotherapy    BP 110/60 (BP Location: Right Arm, Patient Position: Sitting, Cuff Size: Normal)   Pulse 84   Temp 97.8 F (36.6 C) (Oral)   Ht 5' 1.5 (1.562 m)   Wt 135 lb (61.2 kg)   SpO2 98%   BMI 25.09 kg/m    Review of Systems: Gen:  Denies  fever, sweats, chills weight loss  HEENT: Denies blurred vision, double vision, ear pain, eye pain, hearing loss, nose bleeds, sore throat Cardiac:  No dizziness, chest pain or heaviness, chest tightness,edema, No JVD Resp:   No cough, -sputum production, -shortness of breath,-wheezing, -hemoptysis,  Other:  All other systems negative   Physical Examination:   General Appearance: No distress  EYES PERRLA, EOM intact.   NECK Supple, No JVD Pulmonary: normal breath sounds, No wheezing.  CardiovascularNormal S1,S2.  No m/r/g.   Abdomen: Benign, Soft, non-tender. 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 CPAP prescription 5-20 AHI reduced to 1.2  No evidence of acute heart failure at this time No respiratory distress No fevers, chills, nausea, vomiting, diarrhea No evidence hemoptysis  Patient Instructions Continue to use CPAP every night, minimum of 4-6 hours a night.  Change equipment every 30 days  or as directed by DME.  Wash your tubing with warm soap and water daily, hang to dry. Wash humidifier portion weekly. Use bottled, distilled water and change daily   Be aware of reduced alertness and do not drive or operate heavy machinery if experiencing this or drowsiness.  Exercise encouraged, as tolerated. Encouraged proper weight management.  Important to get eight or more hours of sleep  Limiting the use of the computer and television before bedtime.  Decrease naps during the day, so night time sleep will become enhanced.  Limit caffeine, and sleep deprivation.  HTN, stroke, uncontrolled diabetes and heart failure are potential risk factors.  Risk of untreated sleep apnea including cardiac arrhthymias, stroke, DM, pulm HTN.   Pulmonary artery hypertension Echocardiogram shows elevated PA pressures Patient states that right heart cath showed normal PA pressures S/p valve repair surgery  No evidence of acute heart failure at this time Most likely related to sleep apnea Continue CPAP therapy as prescribed  History of pleural effusion  left-sided effusion Previous history of chest tube and thoracentesis At this time her examination does not show any evidence of fluid Patient with good air expansion Patient using incentive spirometry 5-10 times per day No significant respiratory compromise at this time Continue Lasix as tolerated and as per cardiology   Hypertension - Sleep apnea can contribute to hypertension, therefore treatment of sleep apnea is important part of hypertension management.  Diabetes Mellitis - Sleep apnea can contribute to DM, therefore treatment of sleep apnea is important part of DM management.  Follow-up cardiology   MEDICATION ADJUSTMENTS/LABS AND TESTS ORDERED: Continue CPAP as prescribed Avoid secondhand smoke Avoid SICK contacts Recommend  Masking  when appropriate Recommend Keep up-to-date with vaccinations Follow-up cardiology No intervention  for left-sided pleural effusion at this time  CURRENT MEDICATIONS REVIEWED AT LENGTH WITH PATIENT TODAY   Patient  satisfied with Plan of action and management. All questions answered   Follow up 1 year   I spent a total of 45 minutes dedicated to the care of this patient on the date of this encounter to include pre-visit review of records, face-to-face time with the patient discussing conditions above, post visit ordering of testing, clinical documentation with the electronic health record, making appropriate referrals as documented, and communicating necessary information to the patient's healthcare team.    The Patient requires high complexity decision making for assessment and support, frequent evaluation and titration of therapies, application of advanced monitoring technologies and extensive interpretation of multiple databases.  Patient satisfied with Plan of action and management. All questions answered    Nickolas Alm Cellar, M.D.  [facility]  Medical Director [facility] Medical Director [facility] Cardio-Pulmonary Department

## 2024-02-20 ENCOUNTER — Encounter

## 2024-02-20 DIAGNOSIS — I5022 Chronic systolic (congestive) heart failure: Secondary | ICD-10-CM | POA: Diagnosis not present

## 2024-02-20 NOTE — Progress Notes (Signed)
 Daily Session Note  Patient Details  Name: LEANNE SISLER MRN: [id number] Date of Birth: 02-18-1946 Referring Provider:   [facility] from 01/30/2024 in [facility] Cardiac and Pulmonary Rehab  Referring Provider Florencio Kava, MD    Encounter Date: 02/20/2024  Check In:  Session Check In - 02/20/24 0730       Check-In   Supervising physician immediately available to respond to emergencies See telemetry face sheet for immediately available ER MD    Location [facility]-Cardiac & Pulmonary Rehab    Staff Present Burnard Davenport RN,BSN,MPA;Joseph Hood RCP,RRT,BSRT;Maxon Conetta BS, Exercise Physiologist;Noah Tickle, BS, Exercise Physiologist;Jason Elnor RDN,LDN    Virtual Visit No    Medication changes reported     No    Fall or balance concerns reported    No    Warm-up and Cool-down Performed on first and last piece of equipment    Resistance Training Performed Yes    VAD Patient? No    PAD/SET Patient? No      Pain Assessment   Currently in Pain? No/denies             Social History   Tobacco Use  Smoking Status Never  Smokeless Tobacco Never    Goals Met:  Proper associated with RPD/PD & O2 Sat Independence with exercise equipment Using PLB without cueing & demonstrates good technique Exercise tolerated well No report of concerns or symptoms today Strength training completed today  Goals Unmet:  Not Applicable  Comments: Pt able to follow exercise prescription today without complaint.  Will continue to monitor for progression.    Dr. Oneil Pinal is Medical Director for [facility] Cardiac Rehabilitation.  Dr. Fuad Aleskerov is Medical Director for [facility] Pulmonary Rehabilitation.

## 2024-02-25 ENCOUNTER — Encounter

## 2024-02-25 DIAGNOSIS — I5022 Chronic systolic (congestive) heart failure: Secondary | ICD-10-CM | POA: Diagnosis not present

## 2024-02-25 NOTE — Progress Notes (Signed)
 Daily Session Note  Patient Details  Name: Erin Middleton MRN: [id number] Date of Birth: 01/19/1946 Referring Provider:   [facility] from 01/30/2024 in [facility] Cardiac and Pulmonary Rehab  Referring Provider Florencio Kava, MD    Encounter Date: 02/25/2024  Check In:  Session Check In - 02/25/24 0741       Check-In   Supervising physician immediately available to respond to emergencies See telemetry face sheet for immediately available ER MD    Location [facility]-Cardiac & Pulmonary Rehab    Staff Present Burnard Davenport RN,BSN,MPA;Joseph [facility] Dyane BS, ACSM CEP, Exercise Physiologist;Jason Elnor RDN,LDN    Virtual Visit No    Medication changes reported     No    Fall or balance concerns reported    No    Warm-up and Cool-down Performed on first and last piece of equipment    Resistance Training Performed Yes    VAD Patient? No    PAD/SET Patient? No      Pain Assessment   Currently in Pain? No/denies             Social History   Tobacco Use  Smoking Status Never  Smokeless Tobacco Never    Goals Met:  Proper associated with RPD/PD & O2 Sat Independence with exercise equipment Using PLB without cueing & demonstrates good technique Exercise tolerated well No report of concerns or symptoms today Strength training completed today  Goals Unmet:  Not Applicable  Comments: Pt able to follow exercise prescription today without complaint.  Will continue to monitor for progression.    Dr. Oneil Pinal is Medical Director for [facility] Cardiac Rehabilitation.  Dr. Fuad Aleskerov is Medical Director for [facility] Pulmonary Rehabilitation.

## 2024-02-27 ENCOUNTER — Encounter

## 2024-02-27 DIAGNOSIS — I5022 Chronic systolic (congestive) heart failure: Secondary | ICD-10-CM

## 2024-02-27 NOTE — Progress Notes (Deleted)
 Cardiac Individual Treatment Plan  Patient Details  Name: Erin Middleton MRN: [id number] Date of Birth: [birth year] Referring Provider:   [facility] Pulmonary Rehab from 01/30/2024 in [facility] Cardiac and Pulmonary Rehab  Referring Provider Florencio Kava, MD    Initial Encounter Date:  Flowsheet Row Pulmonary Rehab from 01/30/2024 in [facility] Cardiac and Pulmonary Rehab  Date 01/30/24    Visit Diagnosis: Heart failure, chronic systolic (HCC)  Patient's Home Medications on Admission:  Current Outpatient Medications:    ACCU-CHEK GUIDE test strip, CHECK BLOOD SUGAR EVERY DAY, Disp: 100 strip, Rfl: 2   Accu-Chek Softclix Lancets lancets, CHECK BLOOD SUGAR ONCE DAILY, Disp: 100 each, Rfl: 12   amLODipine  (NORVASC ) 5 MG tablet, TAKE 1 TABLET BY MOUTH EVERY DAY IN THE EVENING (Patient not taking: Reported on 02/19/2024), Disp: 90 tablet, Rfl: 2   blood glucose meter kit and supplies, Dispense based on patient and insurance preference. Check blood sugar once a day (FOR ICD-10 E10.9, E11.9)., Disp: 1 each, Rfl: 0   Cholecalciferol  10 MCG (400 UNIT) CAPS, , Disp: , Rfl:    furosemide (LASIX) 40 MG tablet, Take 40 mg by mouth. (Patient taking differently: Take 20 mg by mouth daily.), Disp: , Rfl:    metFORMIN  (GLUCOPHAGE ) 500 MG tablet, Take 1 tablet (500 mg total) by mouth 2 (two) times daily with a meal., Disp: 180 tablet, Rfl: 1   Multiple Vitamins-Minerals (PRESERVISION/LUTEIN ) CAPS, Take 1 capsule by mouth at bedtime., Disp: , Rfl:    omeprazole  (PRILOSEC) 20 MG capsule, Take 20 mg by mouth., Disp: , Rfl:    rosuvastatin  (CRESTOR ) 40 MG tablet, Take 1 tablet by mouth at bedtime., Disp: , Rfl:    spironolactone (ALDACTONE) 25 MG tablet, Take 25 mg by mouth daily. (Patient not taking: Reported on 02/19/2024), Disp: , Rfl:    telmisartan (MICARDIS) 80 MG tablet, TAKE 1 TABLET BY MOUTH EVERY DAY (Patient not taking: Reported on 02/19/2024), Disp: 90 tablet, Rfl: 3   vitamin E  400 UNIT capsule, Take  400 Units by mouth daily. 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 breast cancer - chemotherapy   Cancer (HCC)    Breast   Diabetes mellitus without complication (HCC)    GERD (gastroesophageal reflux disease)    Headache    migraines h/o   Hypertension    Macular degeneration of both eyes, unspecified type    pt states its pretty advanced.   Personal history of chemotherapy     Tobacco Use: Social History   Tobacco Use  Smoking Status Never  Smokeless Tobacco Never    Labs: Review Flowsheet  More data exists      Latest Ref Rng & Units 03/09/2020 08/03/2020 12/21/2020 05/30/2021 10/27/2021  Labs for ITP Cardiac and Pulmonary Rehab  Cholestrol 100 - 199 mg/dL - 879  - 887  -  LDL (calc) 0 - 99 mg/dL - 47  - 38  -  HDL-C >60 mg/dL - 57  - 60  -  Trlycerides 0 - 149 mg/dL - 81  - 67  -  Hemoglobin A1c 4.8 - 5.6 % 6.5  6.6  7.0  6.8  7.7      Exercise Target Goals: Exercise Program Goal: Individual exercise prescription set using results from initial 6 min walk test and THRR while considering  patient's activity barriers and safety.   Exercise Prescription  Goal: Initial exercise prescription builds to 30-45 minutes a day of aerobic activity, 2-3 days per week.  Home exercise guidelines will be given to patient during program as part of exercise prescription that the participant will acknowledge.   Education: Aerobic Exercise: - Group verbal and visual presentation on the components of exercise prescription. Introduces F.I.T.T principle from ACSM for exercise prescriptions.  Reviews F.I.T.T. principles of aerobic exercise including progression. Written material provided at class time.   Education: Resistance Exercise: - Group verbal and visual presentation on the components of exercise prescription. Introduces F.I.T.T principle from ACSM for exercise prescriptions   Reviews F.I.T.T. principles of resistance exercise including progression. Written material provided at class time.    Education: Exercise & Equipment Safety: - Individual verbal instruction and demonstration of equipment use and safety with use of the equipment. Flowsheet Row Pulmonary Rehab from 01/30/2024 in [facility] Cardiac and Pulmonary Rehab  Date 01/30/24  Educator MB  Instruction Review Code 1- Verbalizes Understanding    Education: Exercise Physiology & General Exercise Guidelines: - Group verbal and written instruction with models to review the exercise physiology of the cardiovascular system and associated critical values. Provides general exercise guidelines with specific guidelines to those with heart or lung disease. Written material provided at class time.   Education: Flexibility, Balance, Mind/Body Relaxation: - Group verbal and visual presentation with interactive activity on the components of exercise prescription. Introduces F.I.T.T principle from ACSM for exercise prescriptions. Reviews F.I.T.T. principles of flexibility and balance exercise training including progression. Also discusses the mind body connection.  Reviews various relaxation techniques to help reduce and manage stress (i.e. Deep breathing, progressive muscle relaxation, and visualization). Balance handout provided to take home. Written material provided at class time.   Activity Barriers & Risk Stratification:  Activity Barriers & Cardiac Risk Stratification - 01/30/24 1526       Activity Barriers & Cardiac Risk Stratification   Activity Barriers Back Problems;Shortness of Breath;Balance Concerns;Left Knee Replacement          6 Minute Walk:  6 Minute Walk     Row Name 01/30/24 1524         6 Minute Walk   Phase Initial     Distance 830 feet     Walk Time 6 minutes     # of Rest Breaks 0     MPH 1.57     METS 1.68     RPE 11     Perceived Dyspnea  1     VO2 Peak 5.89     Symptoms Yes  (comment)     Comments 6/10 back pain     Resting HR 81 bpm     Resting BP 128/60     Resting Oxygen Saturation  96 %     Exercise Oxygen Saturation  during 6 min walk 96 %     Max Ex. HR 97 bpm     Max Ex. BP 160/80     2 Minute Post BP 138/72       Interval HR   1 Minute HR 89     2 Minute HR 95     3 Minute HR 97     4 Minute HR 94     5 Minute HR 94     6 Minute HR 97     2 Minute Post HR 80     Interval Heart Rate? Yes       Interval Oxygen   Interval Oxygen? Yes  Baseline Oxygen Saturation % 96 %     1 Minute Oxygen Saturation % 97 %     1 Minute Liters of Oxygen 0 L     2 Minute Oxygen Saturation % 97 %     2 Minute Liters of Oxygen 0 L     3 Minute Oxygen Saturation % 98 %     3 Minute Liters of Oxygen 0 L     4 Minute Oxygen Saturation % 96 %     4 Minute Liters of Oxygen 0 L     5 Minute Oxygen Saturation % 96 %     5 Minute Liters of Oxygen 0 L     6 Minute Oxygen Saturation % 96 %     6 Minute Liters of Oxygen 0 L     2 Minute Post Oxygen Saturation % 97 %     2 Minute Post Liters of Oxygen 0 L        Oxygen Initial Assessment:  Oxygen Initial Assessment - 01/23/24 1108       Home Oxygen   Home Oxygen Device None    Sleep Oxygen Prescription None    Home Exercise Oxygen Prescription None    Home Resting Oxygen Prescription None    Compliance with Home Oxygen Use Yes      Intervention   Short Term Goals To learn and understand importance of maintaining oxygen saturations>88%;To learn and demonstrate proper pursed lip breathing techniques or other breathing techniques. ;To learn and demonstrate proper use of respiratory medications;To learn and understand importance of monitoring SPO2 with pulse oximeter and demonstrate accurate use of the pulse oximeter.    Long  Term Goals Verbalizes importance of monitoring SPO2 with pulse oximeter and return demonstration;Maintenance of O2 saturations>88%;Exhibits proper breathing techniques, such as pursed lip  breathing or other method taught during program session;Compliance with respiratory medication          Oxygen Re-Evaluation:  Oxygen Re-Evaluation     Row Name 02/06/24 0808             Home Oxygen   Home Oxygen Device None       Sleep Oxygen Prescription None       Home Exercise Oxygen Prescription None       Home Resting Oxygen Prescription None       Compliance with Home Oxygen Use Yes         Goals/Expected Outcomes   Short Term Goals To learn and understand importance of maintaining oxygen saturations>88%;To learn and demonstrate proper pursed lip breathing techniques or other breathing techniques. ;To learn and demonstrate proper use of respiratory medications;To learn and understand importance of monitoring SPO2 with pulse oximeter and demonstrate accurate use of the pulse oximeter.       Long  Term Goals Verbalizes importance of monitoring SPO2 with pulse oximeter and return demonstration;Maintenance of O2 saturations>88%;Exhibits proper breathing techniques, such as pursed lip breathing or other method taught during program session;Compliance with respiratory medication       Comments Reviewed PLB technique with pt.  Talked about how it works and it's importance in maintaining their exercise saturations.       Goals/Expected Outcomes Short: Become more profiecient at using PLB. Long: Become independent at using PLB.          Oxygen Discharge (Final Oxygen Re-Evaluation):  Oxygen Re-Evaluation - 02/06/24 0808       Home Oxygen   Home Oxygen Device None    Sleep  Oxygen Prescription None    Home Exercise Oxygen Prescription None    Home Resting Oxygen Prescription None    Compliance with Home Oxygen Use Yes      Goals/Expected Outcomes   Short Term Goals To learn and understand importance of maintaining oxygen saturations>88%;To learn and demonstrate proper pursed lip breathing techniques or other breathing techniques. ;To learn and demonstrate proper use of  respiratory medications;To learn and understand importance of monitoring SPO2 with pulse oximeter and demonstrate accurate use of the pulse oximeter.    Long  Term Goals Verbalizes importance of monitoring SPO2 with pulse oximeter and return demonstration;Maintenance of O2 saturations>88%;Exhibits proper breathing techniques, such as pursed lip breathing or other method taught during program session;Compliance with respiratory medication    Comments Reviewed PLB technique with pt.  Talked about how it works and it's importance in maintaining their exercise saturations.    Goals/Expected Outcomes Short: Become more profiecient at using PLB. Long: Become independent at using PLB.          Initial Exercise Prescription:  Initial Exercise Prescription - 01/30/24 1500       Date of Initial Exercise RX and Referring Provider   Date 01/30/24    Referring Provider Florencio Kava, MD      Oxygen   Maintain Oxygen Saturation 88% or higher      Recumbant Bike   Level 1    RPM 50    Watts 15    Minutes 15    METs 1.68      NuStep   Level 1    SPM 80    Minutes 15    METs 1.68      Biostep-RELP   Level 1    SPM 50    Minutes 15    METs 1.68      Track   Laps 20   Hallway   Minutes 15    METs 2.09      Prescription Details   Frequency (times per week) 2    Duration Progress to 30 minutes of continuous aerobic without signs/symptoms of physical distress      Intensity   THRR 40-80% of Max Heartrate 104-128    Ratings of Perceived Exertion 11-13    Perceived Dyspnea 0-4      Progression   Progression Continue to progress workloads to maintain intensity without signs/symptoms of physical distress.      Resistance Training   Training Prescription Yes    Weight 3 lb    Reps 10-15          Perform Capillary Blood Glucose checks as needed.  Exercise Prescription Changes:   Exercise Prescription Changes     Row Name 01/30/24 1500 02/22/24 0800            Response to Exercise   Blood Pressure (Admit) 128/60 122/62      Blood Pressure (Exercise) 160/80 142/68      Blood Pressure (Exit) 138/72 126/62      Heart Rate (Admit) 81 bpm 80 bpm      Heart Rate (Exercise) 97 bpm 83 bpm      Heart Rate (Exit) 80 bpm 80 bpm      Oxygen Saturation (Admit) 96 % 97 %      Oxygen Saturation (Exercise) 98 % 96 %      Oxygen Saturation (Exit) 97 % 97 %      Rating of Perceived Exertion (Exercise) 11 13      Perceived Dyspnea (  Exercise) 1 1      Symptoms 6/10 back pain --      Comments results First three days of exercise      Duration -- Progress to 30 minutes of  aerobic without signs/symptoms of physical distress      Intensity -- THRR unchanged        Progression   Progression -- Continue to progress workloads to maintain intensity without signs/symptoms of physical distress.      Average METs 1.68 1.82        Resistance Training   Training Prescription -- Yes      Weight -- 3 lb      Reps -- 10-15        Interval Training   Interval Training -- No        Recumbant Bike   Level -- 1      Watts -- 15      Minutes -- 15      METs -- 2.76        NuStep   Level -- 1      Minutes -- 15      METs -- 1.5        Biostep-RELP   Level -- 1      Minutes -- 30      METs -- 2        Track   Laps -- 6      Minutes -- 15      METs -- 1.33        Oxygen   Maintain Oxygen Saturation -- 88% or higher         Exercise Comments:   Exercise Comments     Row Name 02/06/24 0807           Exercise Comments First full day of exercise!  Patient was oriented to gym and equipment including functions, settings, policies, and procedures.  Patient's individual exercise prescription and treatment plan were reviewed.  All starting workloads were established based on the results of the 6 minute walk test done at initial orientation visit.  The plan for exercise progression was also introduced and progression will be customized based on patient's  performance and goals.          Exercise Goals and Review:   Exercise Goals     Row Name 01/30/24 1532             Exercise Goals   Increase Physical Activity Yes       Intervention Provide advice, education, support and counseling about physical activity/exercise needs.;Develop an individualized exercise prescription for aerobic and resistive training based on initial evaluation findings, risk stratification, comorbidities and participant's personal goals.       Expected Outcomes Short Term: Attend rehab on a regular basis to increase amount of physical activity.;Long Term: Add in home exercise to make exercise part of routine and to increase amount of physical activity.;Long Term: Exercising regularly at least 3-5 days a week.       Increase Strength and Stamina Yes       Intervention Provide advice, education, support and counseling about physical activity/exercise needs.;Develop an individualized exercise prescription for aerobic and resistive training based on initial evaluation findings, risk stratification, comorbidities and participant's personal goals.       Expected Outcomes Short Term: Increase workloads from initial exercise prescription for resistance, speed, and METs.;Short Term: Perform resistance training exercises routinely during rehab and add in resistance training at home;Long Term: Improve  cardiorespiratory fitness, muscular endurance and strength as measured by increased METs and functional capacity ( )       Able to understand and use rate of perceived exertion (RPE) scale Yes       Intervention Provide education and explanation on how to use RPE scale       Expected Outcomes Short Term: Able to use RPE daily in rehab to express subjective intensity level;Long Term:  Able to use RPE to guide intensity level when exercising independently       Able to understand and use Dyspnea scale Yes       Intervention Provide education and explanation on how to use Dyspnea scale        Expected Outcomes Short Term: Able to use Dyspnea scale daily in rehab to express subjective sense of shortness of breath during exertion;Long Term: Able to use Dyspnea scale to guide intensity level when exercising independently       Knowledge and understanding of Target Heart Rate Range (THRR) Yes       Intervention Provide education and explanation of THRR including how the numbers were predicted and where they are located for reference       Expected Outcomes Short Term: Able to state/look up THRR;Short Term: Able to use daily as guideline for intensity in rehab;Long Term: Able to use THRR to govern intensity when exercising independently       Able to check pulse independently Yes       Intervention Provide education and demonstration on how to check pulse in carotid and radial arteries.;Review the importance of being able to check your own pulse for safety during independent exercise       Expected Outcomes Long Term: Able to check pulse independently and accurately;Short Term: Able to explain why pulse checking is important during independent exercise       Understanding of Exercise Prescription Yes       Intervention Provide education, explanation, and written materials on patient's individual exercise prescription       Expected Outcomes Short Term: Able to explain program exercise prescription;Long Term: Able to explain home exercise prescription to exercise independently          Exercise Goals Re-Evaluation :  Exercise Goals Re-Evaluation     Row Name 02/06/24 0807 02/22/24 0852           Exercise Goal Re-Evaluation   Exercise Goals Review Increase Physical Activity;Able to understand and use rate of perceived exertion (RPE) scale;Knowledge and understanding of Target Heart Rate Range (THRR);Understanding of Exercise Prescription;Increase Strength and Stamina;Able to understand and use Dyspnea scale;Able to check pulse independently Increase Physical Activity;Increase  Strength and Stamina;Understanding of Exercise Prescription      Comments Reviewed RPE and dyspnea scale, THR and program prescription with pt today.  Pt voiced understanding and was given a copy of goals to take home. Bruna is off to a good start in rehab. She did well at level 1 on the T4 nustep, biostep, and the recumbent bike. She also did well walking six laps on the track. We will continue to monitor her progress in the program.      Expected Outcomes Short: Use RPE daily to regulate intensity. Long: Follow program prescription in [facility]. --         Discharge Exercise Prescription (Final Exercise Prescription Changes):  Exercise Prescription Changes - 02/22/24 0800       Response to Exercise   Blood Pressure (Admit) 122/62    Blood Pressure (  Exercise) 142/68    Blood Pressure (Exit) 126/62    Heart Rate (Admit) 80 bpm    Heart Rate (Exercise) 83 bpm    Heart Rate (Exit) 80 bpm    Oxygen Saturation (Admit) 97 %    Oxygen Saturation (Exercise) 96 %    Oxygen Saturation (Exit) 97 %    Rating of Perceived Exertion (Exercise) 13    Perceived Dyspnea (Exercise) 1    Comments First three days of exercise    Duration Progress to 30 minutes of  aerobic without signs/symptoms of physical distress    Intensity THRR unchanged      Progression   Progression Continue to progress workloads to maintain intensity without signs/symptoms of physical distress.    Average METs 1.82      Resistance Training   Training Prescription Yes    Weight 3 lb    Reps 10-15      Interval Training   Interval Training No      Recumbant Bike   Level 1    Watts 15    Minutes 15    METs 2.76      NuStep   Level 1    Minutes 15    METs 1.5      Biostep-RELP   Level 1    Minutes 30    METs 2      Track   Laps 6    Minutes 15    METs 1.33      Oxygen   Maintain Oxygen Saturation 88% or higher          Nutrition:  Target Goals: Understanding of nutrition guidelines, daily intake of sodium  1500mg , cholesterol 200mg , calories 30% from fat and 7% or less from saturated fats, daily to have 5 or more servings of fruits and vegetables.  Education: Nutrition 1 -Group instruction provided by verbal, written material, interactive activities, discussions, models, and posters to present general guidelines for heart healthy nutrition including macronutrients, label reading, and promoting whole foods over processed counterparts. Education serves as Pensions consultant of discussion of heart healthy eating for all. Written material provided at class time.    Education: Nutrition 2 -Group instruction provided by verbal, written material, interactive activities, discussions, models, and posters to present general guidelines for heart healthy nutrition including sodium, cholesterol, and saturated fat. Providing guidance of habit forming to improve blood pressure, cholesterol, and body weight. Written material provided at class time.     Biometrics:  Pre Biometrics - 01/30/24 1533       Pre Biometrics   Height 5' 1.3 (1.557 m)    Weight 138 lb (62.6 kg)    Waist Circumference 27.5 inches    Hip Circumference 42.8 inches    Waist to Hip Ratio 0.64 %    BMI (Calculated) 25.82    Single Leg Stand 4 seconds           Nutrition Therapy Plan and Nutrition Goals:  Nutrition Therapy & Goals - 01/30/24 1535       Nutrition Therapy   RD appointment deferred Yes      Personal Nutrition Goals   Nutrition Goal RD appointment deferred at this time      Intervention Plan   Intervention Prescribe, educate and counsel regarding individualized specific dietary modifications aiming towards targeted core components such as weight, hypertension, lipid management, diabetes, heart failure and other comorbidities.    Expected Outcomes Short Term Goal: Understand basic principles of dietary content, such as calories, fat,  sodium, cholesterol and nutrients.          Nutrition Assessments:  MEDIFICTS  Score Key: >=70 Need to make dietary changes  40-70 Heart Healthy Diet <= 40 Therapeutic Level Cholesterol Diet  Flowsheet Row Pulmonary Rehab from 01/30/2024 in [facility] Cardiac and Pulmonary Rehab  Picture Your Plate Total Score on Admission 53   Picture Your Plate Scores: <59 Unhealthy dietary pattern with much room for improvement. 41-50 Dietary pattern unlikely to meet recommendations for good health and room for improvement. 51-60 More healthful dietary pattern, with some room for improvement.  >60 Healthy dietary pattern, although there may be some specific behaviors that could be improved.    Nutrition Goals Re-Evaluation:   Nutrition Goals Discharge (Final Nutrition Goals Re-Evaluation):   Psychosocial: Target Goals: Acknowledge presence or absence of significant depression and/or stress, maximize coping skills, provide positive support system. Participant is able to verbalize types and ability to use techniques and skills needed for reducing stress and depression.   Education: Stress, Anxiety, and Depression - Group verbal and visual presentation to define topics covered.  Reviews how body is impacted by stress, anxiety, and depression.  Also discusses healthy ways to reduce stress and to treat/manage anxiety and depression. Written material provided at class time.   Education: Sleep Hygiene -Provides group verbal and written instruction about how sleep can affect your health.  Define sleep hygiene, discuss sleep cycles and impact of sleep habits. Review good sleep hygiene tips.   Initial Review & Psychosocial Screening:  Initial Psych Review & Screening - 01/23/24 1046       Initial Review   Current issues with None Identified      Family Dynamics   Good Support System? Yes      Barriers   Psychosocial barriers to participate in program There are no identifiable barriers or psychosocial needs.;The patient should benefit from training in stress management and  relaxation.      Screening Interventions   Interventions Encouraged to exercise;To provide support and resources with identified psychosocial needs    Expected Outcomes Short Term goal: Utilizing psychosocial counselor, staff and physician to assist with identification of specific Stressors or current issues interfering with healing process. Setting desired goal for each stressor or current issue identified.;Long Term Goal: Stressors or current issues are controlled or eliminated.;Short Term goal: Identification and review with participant of any Quality of Life or Depression concerns found by scoring the questionnaire.;Long Term goal: The participant improves quality of Life and PHQ9 Scores as seen by post scores and/or verbalization of changes          Quality of Life Scores:   Scores of 19 and below usually indicate a poorer quality of life in these areas.  A difference of  2-3 points is a clinically meaningful difference.  A difference of 2-3 points in the total score of the Quality of Life Index has been associated with significant improvement in overall quality of life, self-image, physical symptoms, and general health in studies assessing change in quality of life.  PHQ-9: Review Flowsheet  More data exists      01/30/2024 10/27/2021 05/30/2021 08/03/2020 07/14/2019  Depression screen PHQ 2/9  Decreased Interest 1 1 0 1 0 0  Down, Depressed, Hopeless 0 1 1 1  0 0  PHQ - 2 Score 1 2 1 2  0 0  Altered sleeping 1 1 0 1 -  Tired, decreased energy 1 1 2 2  -  Change in appetite 1 0 0 1 -  Feeling bad or failure about yourself  0 0 0 0 -  Trouble concentrating 1 1 0 0 -  Moving slowly or fidgety/restless 1 0 0 0 -  Suicidal thoughts 0 0 0 0 -  PHQ-9 Score 6 5 3 6  -  Difficult doing work/chores Somewhat difficult Somewhat difficult Not difficult at all Not difficult at all -    Details       Multiple values from one day are sorted in reverse-chronological order        Interpretation  of Total Score  Total Score Depression Severity:  1-4 = Minimal depression, 5-9 = Mild depression, 10-14 = Moderate depression, 15-19 = Moderately severe depression, 20-27 = Severe depression   Psychosocial Evaluation and Intervention:  Psychosocial Evaluation - 01/23/24 1058       Psychosocial Evaluation & Interventions   Comments Bruna is coming to the program with systolic heart failure. She notes her shortness of breath has been her main issue, but can tell a difference with her medication now. She has issue with  her vision, so she no longer drives and relies on her daughter for transportation. When asked about stress concerns, she mentions currently she feels like she is doing okay. She is looking forward to attending the program to work on her stamina and strength    Expected Outcomes Short: attend the program for education and exercise Long: develop and maintain positive self care habits.    Continue Psychosocial Services  Follow up required by staff          Psychosocial Re-Evaluation:   Psychosocial Discharge (Final Psychosocial Re-Evaluation):   Vocational Rehabilitation: Provide vocational rehab assistance to qualifying candidates.   Vocational Rehab Evaluation & Intervention:  Vocational Rehab - 01/23/24 1046       Initial Vocational Rehab Evaluation & Intervention   Assessment shows need for Vocational Rehabilitation No          Education: Education Goals: Education classes will be provided on a variety of topics geared toward better understanding of heart health and risk factor modification. Participant will state understanding/return demonstration of topics presented as noted by education test scores.  Learning Barriers/Preferences:  Learning Barriers/Preferences - 01/23/24 1046       Learning Barriers/Preferences   Learning Barriers Sight    Learning Preferences None          General Cardiac Education Topics:  AED/CPR: - Group verbal and written  instruction with the use of models to demonstrate the basic use of the AED with the basic ABC's of resuscitation.   Test and Procedures: - Group verbal and visual presentation and models provide information about basic cardiac anatomy and function. Reviews the testing methods done to diagnose heart disease and the outcomes of the test results. Describes the treatment choices: Medical Management, Angioplasty, or Coronary Bypass Surgery for treating various heart conditions including Myocardial Infarction, Angina, Valve Disease, and Cardiac Arrhythmias. Written material provided at class time.   Medication Safety: - Group verbal and visual instruction to review commonly prescribed medications for heart and lung disease. Reviews the medication, class of the drug, and side effects. Includes the steps to properly store meds and maintain the prescription regimen. Written material provided at class time.   Intimacy: - Group verbal instruction through game format to discuss how heart and lung disease can affect sexual intimacy. Written material provided at class time.   Know Your Numbers and Heart Failure: - Group verbal and visual instruction to discuss disease risk factors  for cardiac and pulmonary disease and treatment options.  Reviews associated critical values for Overweight/Obesity, Hypertension, Cholesterol, and Diabetes.  Discusses basics of heart failure: signs/symptoms and treatments.  Introduces Heart Failure Zone chart for action plan for heart failure. Written material provided at class time.   Infection Prevention: - Provides verbal and written material to individual with discussion of infection control including proper hand washing and proper equipment cleaning during exercise session. Flowsheet Row Pulmonary Rehab from 01/30/2024 in [facility] Cardiac and Pulmonary Rehab  Date 01/30/24  Educator MB  Instruction Review Code 1- Verbalizes Understanding    Falls Prevention: - Provides  verbal and written material to individual with discussion of falls prevention and safety. Flowsheet Row Pulmonary Rehab from 01/30/2024 in [facility] Cardiac and Pulmonary Rehab  Date 01/30/24  Educator MB  Instruction Review Code 1- Verbalizes Understanding    Other: -Provides group and verbal instruction on various topics (see comments)   Knowledge Questionnaire Score:  Knowledge Questionnaire Score - 01/30/24 1535       Knowledge Questionnaire Score   Pre Score 25/26          Core Components/Risk Factors/Patient Goals at Admission:  Personal Goals and Risk Factors at Admission - 01/30/24 1536       Core Components/Risk Factors/Patient Goals on Admission    Weight Management Yes;Weight Maintenance    Intervention Weight Management: Develop a combined nutrition and exercise program designed to reach desired caloric intake, while maintaining appropriate intake of nutrient and fiber, sodium and fats, and appropriate energy expenditure required for the weight goal.;Weight Management: Provide education and appropriate resources to help participant work on and attain dietary goals.;Weight Management/Obesity: Establish reasonable short term and long term weight goals.    Admit Weight 138 lb (62.6 kg)    Goal Weight: Short Term 138 lb (62.6 kg)    Goal Weight: Long Term 138 lb (62.6 kg)    Expected Outcomes Weight Maintenance: Understanding of the daily nutrition guidelines, which includes 25-35% calories from fat, 7% or less cal from saturated fats, less than 200mg  cholesterol, less than 1.5gm of sodium, & 5 or more servings of fruits and vegetables daily;Understanding recommendations for meals to include 15-35% energy as protein, 25-35% energy from fat, 35-60% energy from carbohydrates, less than 200mg  of dietary cholesterol, 20-35 gm of total fiber daily;Understanding of distribution of calorie intake throughout the day with the consumption of 4-5 meals/snacks;Short Term: Continue to assess and  modify interventions until short term weight is achieved;Long Term: Adherence to nutrition and physical activity/exercise program aimed toward attainment of established weight goal    Diabetes Yes    Intervention Provide education about signs/symptoms and action to take for hypo/hyperglycemia.;Provide education about proper nutrition, including hydration, and aerobic/resistive exercise prescription along with prescribed medications to achieve blood glucose in normal ranges: Fasting glucose 65-99 mg/dL    Expected Outcomes Short Term: Participant verbalizes understanding of the signs/symptoms and immediate care of hyper/hypoglycemia, proper foot care and importance of medication, aerobic/resistive exercise and nutrition plan for blood glucose control.;Long Term: Attainment of HbA1C < 7%.    Heart Failure Yes    Intervention Provide a combined exercise and nutrition program that is supplemented with education, support and counseling about heart failure. Directed toward relieving symptoms such as shortness of breath, decreased exercise tolerance, and extremity edema.    Expected Outcomes Improve functional capacity of life;Short term: Attendance in program 2-3 days a week with increased exercise capacity. Reported lower sodium intake. Reported increased fruit and vegetable  intake. Reports medication compliance.;Short term: Daily weights obtained and reported for increase. Utilizing diuretic protocols set by physician.;Long term: Adoption of self-care skills and reduction of barriers for early signs and symptoms recognition and intervention leading to self-care maintenance.    Hypertension Yes    Intervention Provide education on lifestyle modifcations including regular physical activity/exercise, weight management, moderate sodium restriction and increased consumption of fresh fruit, vegetables, and low fat dairy, alcohol moderation, and smoking cessation.;Monitor prescription use compliance.    Expected  Outcomes Short Term: Continued assessment and intervention until BP is < 140/59mm HG in hypertensive participants. < 130/101mm HG in hypertensive participants with diabetes, heart failure or chronic kidney disease.;Long Term: Maintenance of blood pressure at goal levels.    Lipids Yes    Intervention Provide education and support for participant on nutrition & aerobic/resistive exercise along with prescribed medications to achieve LDL 70mg , HDL >40mg .    Expected Outcomes Short Term: Participant states understanding of desired cholesterol values and is compliant with medications prescribed. Participant is following exercise prescription and nutrition guidelines.;Long Term: Cholesterol controlled with medications as prescribed, with individualized exercise RX and with personalized nutrition plan. Value goals: LDL < 70mg , HDL > 40 mg.          Education:Diabetes - Individual verbal and written instruction to review signs/symptoms of diabetes, desired ranges of glucose level fasting, after meals and with exercise. Acknowledge that pre and post exercise glucose checks will be done for 3 sessions at entry of program. Flowsheet Row Pulmonary Rehab from 01/30/2024 in [facility] Cardiac and Pulmonary Rehab  Date 01/30/24  Educator MB  Instruction Review Code 1- Verbalizes Understanding    Core Components/Risk Factors/Patient Goals Review:    Core Components/Risk Factors/Patient Goals at Discharge (Final Review):    ITP Comments:  ITP Comments     Row Name 01/23/24 1058 01/30/24 1524 02/06/24 0807 02/27/24 0957     ITP Comments Initial phone call completed. Diagnosis can be found in [facility] 7/9. EP Orientation scheduled for Wednesday 7/30 at 1:30p. Completed and gym orientation for respiratory care services. Initial ITP created and sent for review to Dr. Faud Aleskerov, Medical Director. First full day of exercise!  Patient was oriented to gym and equipment including functions, settings, policies, and  procedures.  Patient's individual exercise prescription and treatment plan were reviewed.  All starting workloads were established based on the results of the 6 minute walk test done at initial orientation visit.  The plan for exercise progression was also introduced and progression will be customized based on patient's performance and goals. 30 Day review completed. Medical Director ITP review done; changes made as directed and signed approval by Medical Director.New to Program.       Comments: 30 day review

## 2024-02-27 NOTE — Progress Notes (Signed)
 Pulmonary Individual Treatment Plan  Patient Details  Name: REMI RESTER MRN: [id number] Date of Birth: 1946/02/26 Referring Provider:   Conrad Ports Pulmonary Rehab from 01/30/2024 in [facility] Cardiac and Pulmonary Rehab  Referring Provider Florencio Kava, MD    Initial Encounter Date:  Flowsheet Row Pulmonary Rehab from 01/30/2024 in [facility] Cardiac and Pulmonary Rehab  Date 01/30/24    Visit Diagnosis: Heart failure, chronic systolic (HCC)  Patient's Home Medications on Admission:  Current Outpatient Medications:    ACCU-CHEK GUIDE test strip, CHECK BLOOD SUGAR EVERY DAY, Disp: 100 strip, Rfl: 2   Accu-Chek Softclix Lancets lancets, CHECK BLOOD SUGAR ONCE DAILY, Disp: 100 each, Rfl: 12   amLODipine  (NORVASC ) 5 MG tablet, TAKE 1 TABLET BY MOUTH EVERY DAY IN THE EVENING (Patient not taking: Reported on 02/19/2024), Disp: 90 tablet, Rfl: 2   blood glucose meter kit and supplies, Dispense based on patient and insurance preference. Check blood sugar once a day (FOR ICD-10 E10.9, E11.9)., Disp: 1 each, Rfl: 0   Cholecalciferol  10 MCG (400 UNIT) CAPS, , Disp: , Rfl:    furosemide (LASIX) 40 MG tablet, Take 40 mg by mouth. (Patient taking differently: Take 20 mg by mouth daily.), Disp: , Rfl:    metFORMIN  (GLUCOPHAGE ) 500 MG tablet, Take 1 tablet (500 mg total) by mouth 2 (two) times daily with a meal., Disp: 180 tablet, Rfl: 1   Multiple Vitamins-Minerals (PRESERVISION/LUTEIN ) CAPS, Take 1 capsule by mouth at bedtime., Disp: , Rfl:    omeprazole  (PRILOSEC) 20 MG capsule, Take 20 mg by mouth., Disp: , Rfl:    rosuvastatin  (CRESTOR ) 40 MG tablet, Take 1 tablet by mouth at bedtime., Disp: , Rfl:    spironolactone (ALDACTONE) 25 MG tablet, Take 25 mg by mouth daily. (Patient not taking: Reported on 02/19/2024), Disp: , Rfl:    telmisartan (MICARDIS) 80 MG tablet, TAKE 1 TABLET BY MOUTH EVERY DAY (Patient not taking: Reported on 02/19/2024), Disp: 90 tablet, Rfl: 3   vitamin E  400 UNIT capsule, Take  400 Units by mouth daily. 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 breast cancer - chemotherapy   Cancer (HCC)    Breast   Diabetes mellitus without complication (HCC)    GERD (gastroesophageal reflux disease)    Headache    migraines h/o   Hypertension    Macular degeneration of both eyes, unspecified type    pt states its pretty advanced.   Personal history of chemotherapy     Tobacco Use: Social History   Tobacco Use  Smoking Status Never  Smokeless Tobacco Never    Labs: Review Flowsheet  More data exists      Latest Ref Rng & Units 03/09/2020 08/03/2020 12/21/2020 05/30/2021 10/27/2021  Labs for ITP Cardiac and Pulmonary Rehab  Cholestrol 100 - 199 mg/dL - 879  - 887  -  LDL (calc) 0 - 99 mg/dL - 47  - 38  -  HDL-C >60 mg/dL - 57  - 60  -  Trlycerides 0 - 149 mg/dL - 81  - 67  -  Hemoglobin A1c 4.8 - 5.6 % 6.5  6.6  7.0  6.8  7.7      Pulmonary Assessment Scores:  Pulmonary Assessment Scores     Row Name 01/30/24 1534         ADL UCSD   ADL Phase Entry  SOB Score total 31     Rest 1     Walk 1     Stairs 4     Bath 0     Dress 0     Shop 3       CAT Score   CAT Score 17       mMRC Score   mMRC Score 3        UCSD: Self-administered rating of dyspnea associated with activities of daily living (ADLs) 6-point scale (0 = not at all to 5 = maximal or unable to do because of breathlessness)  Scoring Scores range from 0 to 120.  Minimally important difference is 5 units  CAT: CAT can identify the health impairment of COPD patients and is better correlated with disease progression.  CAT has a scoring range of zero to 40. The CAT score is classified into four groups of low (less than 10), medium (10 - 20), high (21-30) and very high (31-40) based on the impact level of disease on health status. A CAT score over 10 suggests significant symptoms.   A worsening CAT score could be explained by an exacerbation, poor medication adherence, poor inhaler technique, or progression of COPD or comorbid conditions.  CAT MCID is 2 points  mMRC: mMRC (Modified Medical Research Council) Dyspnea Scale is used to assess the degree of baseline functional disability in patients of respiratory disease due to dyspnea. No minimal important difference is established. A decrease in score of 1 point or greater is considered a positive change.   Pulmonary Function Assessment:   Exercise Target Goals: Exercise Program Goal: Individual exercise prescription set using results from initial 6 min walk test and THRR while considering  patient's activity barriers and safety.   Exercise Prescription Goal: Initial exercise prescription builds to 30-45 minutes a day of aerobic activity, 2-3 days per week.  Home exercise guidelines will be given to patient during program as part of exercise prescription that the participant will acknowledge.  Education: Aerobic Exercise: - Group verbal and visual presentation on the components of exercise prescription. Introduces F.I.T.T principle from ACSM for exercise prescriptions.  Reviews F.I.T.T. principles of aerobic exercise including progression. Written material provided at class time.   Education: Resistance Exercise: - Group verbal and visual presentation on the components of exercise prescription. Introduces F.I.T.T principle from ACSM for exercise prescriptions  Reviews F.I.T.T. principles of resistance exercise including progression. Written material provided at class time.    Education: Exercise & Equipment Safety: - Individual verbal instruction and demonstration of equipment use and safety with use of the equipment. Flowsheet Row Pulmonary Rehab from 01/30/2024 in [facility] Cardiac and Pulmonary Rehab  Date 01/30/24  Educator MB  Instruction Review Code 1- Verbalizes Understanding    Education: Exercise Physiology &  General Exercise Guidelines: - Group verbal and written instruction with models to review the exercise physiology of the cardiovascular system and associated critical values. Provides general exercise guidelines with specific guidelines to those with heart or lung disease.    Education: Flexibility, Balance, Mind/Body Relaxation: - Group verbal and visual presentation with interactive activity on the components of exercise prescription. Introduces F.I.T.T principle from ACSM for exercise prescriptions. Reviews F.I.T.T. principles of flexibility and balance exercise training including progression. Also discusses the mind body connection.  Reviews various relaxation techniques to help reduce and manage stress (i.e. Deep breathing, progressive muscle relaxation, and visualization). Balance handout provided to take home. Written material provided at class time.   Activity Barriers &  Risk Stratification:  Activity Barriers & Cardiac Risk Stratification - 01/30/24 1526       Activity Barriers & Cardiac Risk Stratification   Activity Barriers Back Problems;Shortness of Breath;Balance Concerns;Left Knee Replacement          6 Minute Walk:  6 Minute Walk     Row Name 01/30/24 1524         6 Minute Walk   Phase Initial     Distance 830 feet     Walk Time 6 minutes     # of Rest Breaks 0     MPH 1.57     METS 1.68     RPE 11     Perceived Dyspnea  1     VO2 Peak 5.89     Symptoms Yes (comment)     Comments 6/10 back pain     Resting HR 81 bpm     Resting BP 128/60     Resting Oxygen Saturation  96 %     Exercise Oxygen Saturation  during 6 min walk 96 %     Max Ex. HR 97 bpm     Max Ex. BP 160/80     2 Minute Post BP 138/72       Interval HR   1 Minute HR 89     2 Minute HR 95     3 Minute HR 97     4 Minute HR 94     5 Minute HR 94     6 Minute HR 97     2 Minute Post HR 80     Interval Heart Rate? Yes       Interval Oxygen   Interval Oxygen? Yes     Baseline Oxygen  Saturation % 96 %     1 Minute Oxygen Saturation % 97 %     1 Minute Liters of Oxygen 0 L     2 Minute Oxygen Saturation % 97 %     2 Minute Liters of Oxygen 0 L     3 Minute Oxygen Saturation % 98 %     3 Minute Liters of Oxygen 0 L     4 Minute Oxygen Saturation % 96 %     4 Minute Liters of Oxygen 0 L     5 Minute Oxygen Saturation % 96 %     5 Minute Liters of Oxygen 0 L     6 Minute Oxygen Saturation % 96 %     6 Minute Liters of Oxygen 0 L     2 Minute Post Oxygen Saturation % 97 %     2 Minute Post Liters of Oxygen 0 L       Oxygen Initial Assessment:  Oxygen Initial Assessment - 01/23/24 1108       Home Oxygen   Home Oxygen Device None    Sleep Oxygen Prescription None    Home Exercise Oxygen Prescription None    Home Resting Oxygen Prescription None    Compliance with Home Oxygen Use Yes      Intervention   Short Term Goals To learn and understand importance of maintaining oxygen saturations>88%;To learn and demonstrate proper pursed lip breathing techniques or other breathing techniques. ;To learn and demonstrate proper use of respiratory medications;To learn and understand importance of monitoring SPO2 with pulse oximeter and demonstrate accurate use of the pulse oximeter.    Long  Term Goals Verbalizes importance of monitoring SPO2 with pulse oximeter and return  demonstration;Maintenance of O2 saturations>88%;Exhibits proper breathing techniques, such as pursed lip breathing or other method taught during program session;Compliance with respiratory medication          Oxygen Re-Evaluation:  Oxygen Re-Evaluation     Row Name 02/06/24 0808             Home Oxygen   Home Oxygen Device None       Sleep Oxygen Prescription None       Home Exercise Oxygen Prescription None       Home Resting Oxygen Prescription None       Compliance with Home Oxygen Use Yes         Goals/Expected Outcomes   Short Term Goals To learn and understand importance of maintaining  oxygen saturations>88%;To learn and demonstrate proper pursed lip breathing techniques or other breathing techniques. ;To learn and demonstrate proper use of respiratory medications;To learn and understand importance of monitoring SPO2 with pulse oximeter and demonstrate accurate use of the pulse oximeter.       Long  Term Goals Verbalizes importance of monitoring SPO2 with pulse oximeter and return demonstration;Maintenance of O2 saturations>88%;Exhibits proper breathing techniques, such as pursed lip breathing or other method taught during program session;Compliance with respiratory medication       Comments Reviewed PLB technique with pt.  Talked about how it works and it's importance in maintaining their exercise saturations.       Goals/Expected Outcomes Short: Become more profiecient at using PLB. Long: Become independent at using PLB.          Oxygen Discharge (Final Oxygen Re-Evaluation):  Oxygen Re-Evaluation - 02/06/24 0808       Home Oxygen   Home Oxygen Device None    Sleep Oxygen Prescription None    Home Exercise Oxygen Prescription None    Home Resting Oxygen Prescription None    Compliance with Home Oxygen Use Yes      Goals/Expected Outcomes   Short Term Goals To learn and understand importance of maintaining oxygen saturations>88%;To learn and demonstrate proper pursed lip breathing techniques or other breathing techniques. ;To learn and demonstrate proper use of respiratory medications;To learn and understand importance of monitoring SPO2 with pulse oximeter and demonstrate accurate use of the pulse oximeter.    Long  Term Goals Verbalizes importance of monitoring SPO2 with pulse oximeter and return demonstration;Maintenance of O2 saturations>88%;Exhibits proper breathing techniques, such as pursed lip breathing or other method taught during program session;Compliance with respiratory medication    Comments Reviewed PLB technique with pt.  Talked about how it works and it's  importance in maintaining their exercise saturations.    Goals/Expected Outcomes Short: Become more profiecient at using PLB. Long: Become independent at using PLB.          Initial Exercise Prescription:  Initial Exercise Prescription - 01/30/24 1500       Date of Initial Exercise RX and Referring Provider   Date 01/30/24    Referring Provider Florencio Kava, MD      Oxygen   Maintain Oxygen Saturation 88% or higher      Recumbant Bike   Level 1    RPM 50    Watts 15    Minutes 15    METs 1.68      NuStep   Level 1    SPM 80    Minutes 15    METs 1.68      Biostep-RELP   Level 1    SPM 50  Minutes 15    METs 1.68      Track   Laps 20   Hallway   Minutes 15    METs 2.09      Prescription Details   Frequency (times per week) 2    Duration Progress to 30 minutes of continuous aerobic without signs/symptoms of physical distress      Intensity   THRR 40-80% of Max Heartrate 104-128    Ratings of Perceived Exertion 11-13    Perceived Dyspnea 0-4      Progression   Progression Continue to progress workloads to maintain intensity without signs/symptoms of physical distress.      Resistance Training   Training Prescription Yes    Weight 3 lb    Reps 10-15          Perform Capillary Blood Glucose checks as needed.  Exercise Prescription Changes:   Exercise Prescription Changes     Row Name 01/30/24 1500 02/22/24 0800           Response to Exercise   Blood Pressure (Admit) 128/60 122/62      Blood Pressure (Exercise) 160/80 142/68      Blood Pressure (Exit) 138/72 126/62      Heart Rate (Admit) 81 bpm 80 bpm      Heart Rate (Exercise) 97 bpm 83 bpm      Heart Rate (Exit) 80 bpm 80 bpm      Oxygen Saturation (Admit) 96 % 97 %      Oxygen Saturation (Exercise) 98 % 96 %      Oxygen Saturation (Exit) 97 % 97 %      Rating of Perceived Exertion (Exercise) 11 13      Perceived Dyspnea (Exercise) 1 1      Symptoms 6/10 back pain --       Comments results First three days of exercise      Duration -- Progress to 30 minutes of  aerobic without signs/symptoms of physical distress      Intensity -- THRR unchanged        Progression   Progression -- Continue to progress workloads to maintain intensity without signs/symptoms of physical distress.      Average METs 1.68 1.82        Resistance Training   Training Prescription -- Yes      Weight -- 3 lb      Reps -- 10-15        Interval Training   Interval Training -- No        Recumbant Bike   Level -- 1      Watts -- 15      Minutes -- 15      METs -- 2.76        NuStep   Level -- 1      Minutes -- 15      METs -- 1.5        Biostep-RELP   Level -- 1      Minutes -- 30      METs -- 2        Track   Laps -- 6      Minutes -- 15      METs -- 1.33        Oxygen   Maintain Oxygen Saturation -- 88% or higher         Exercise Comments:   Exercise Comments     Row Name 02/06/24 [phone]  Exercise Comments First full day of exercise!  Patient was oriented to gym and equipment including functions, settings, policies, and procedures.  Patient's individual exercise prescription and treatment plan were reviewed.  All starting workloads were established based on the results of the 6 minute walk test done at initial orientation visit.  The plan for exercise progression was also introduced and progression will be customized based on patient's performance and goals.          Exercise Goals and Review:   Exercise Goals     Row Name 01/30/24 1532             Exercise Goals   Increase Physical Activity Yes       Intervention Provide advice, education, support and counseling about physical activity/exercise needs.;Develop an individualized exercise prescription for aerobic and resistive training based on initial evaluation findings, risk stratification, comorbidities and participant's personal goals.       Expected Outcomes Short Term: Attend rehab  on a regular basis to increase amount of physical activity.;Long Term: Add in home exercise to make exercise part of routine and to increase amount of physical activity.;Long Term: Exercising regularly at least 3-5 days a week.       Increase Strength and Stamina Yes       Intervention Provide advice, education, support and counseling about physical activity/exercise needs.;Develop an individualized exercise prescription for aerobic and resistive training based on initial evaluation findings, risk stratification, comorbidities and participant's personal goals.       Expected Outcomes Short Term: Increase workloads from initial exercise prescription for resistance, speed, and METs.;Short Term: Perform resistance training exercises routinely during rehab and add in resistance training at home;Long Term: Improve cardiorespiratory fitness, muscular endurance and strength as measured by increased METs and functional capacity ( )       Able to understand and use rate of perceived exertion (RPE) scale Yes       Intervention Provide education and explanation on how to use RPE scale       Expected Outcomes Short Term: Able to use RPE daily in rehab to express subjective intensity level;Long Term:  Able to use RPE to guide intensity level when exercising independently       Able to understand and use Dyspnea scale Yes       Intervention Provide education and explanation on how to use Dyspnea scale       Expected Outcomes Short Term: Able to use Dyspnea scale daily in rehab to express subjective sense of shortness of breath during exertion;Long Term: Able to use Dyspnea scale to guide intensity level when exercising independently       Knowledge and understanding of Target Heart Rate Range (THRR) Yes       Intervention Provide education and explanation of THRR including how the numbers were predicted and where they are located for reference       Expected Outcomes Short Term: Able to state/look up THRR;Short  Term: Able to use daily as guideline for intensity in rehab;Long Term: Able to use THRR to govern intensity when exercising independently       Able to check pulse independently Yes       Intervention Provide education and demonstration on how to check pulse in carotid and radial arteries.;Review the importance of being able to check your own pulse for safety during independent exercise       Expected Outcomes Long Term: Able to check pulse independently and accurately;Short Term: Able to explain why pulse  checking is important during independent exercise       Understanding of Exercise Prescription Yes       Intervention Provide education, explanation, and written materials on patient's individual exercise prescription       Expected Outcomes Short Term: Able to explain program exercise prescription;Long Term: Able to explain home exercise prescription to exercise independently          Exercise Goals Re-Evaluation :  Exercise Goals Re-Evaluation     Row Name 02/06/24 0807 02/22/24 0852           Exercise Goal Re-Evaluation   Exercise Goals Review Increase Physical Activity;Able to understand and use rate of perceived exertion (RPE) scale;Knowledge and understanding of Target Heart Rate Range (THRR);Understanding of Exercise Prescription;Increase Strength and Stamina;Able to understand and use Dyspnea scale;Able to check pulse independently Increase Physical Activity;Increase Strength and Stamina;Understanding of Exercise Prescription      Comments Reviewed RPE and dyspnea scale, THR and program prescription with pt today.  Pt voiced understanding and was given a copy of goals to take home. Bruna is off to a good start in rehab. She did well at level 1 on the T4 nustep, biostep, and the recumbent bike. She also did well walking six laps on the track. We will continue to monitor her progress in the program.      Expected Outcomes Short: Use RPE daily to regulate intensity. Long: Follow program  prescription in [facility]. --         Discharge Exercise Prescription (Final Exercise Prescription Changes):  Exercise Prescription Changes - 02/22/24 0800       Response to Exercise   Blood Pressure (Admit) 122/62    Blood Pressure (Exercise) 142/68    Blood Pressure (Exit) 126/62    Heart Rate (Admit) 80 bpm    Heart Rate (Exercise) 83 bpm    Heart Rate (Exit) 80 bpm    Oxygen Saturation (Admit) 97 %    Oxygen Saturation (Exercise) 96 %    Oxygen Saturation (Exit) 97 %    Rating of Perceived Exertion (Exercise) 13    Perceived Dyspnea (Exercise) 1    Comments First three days of exercise    Duration Progress to 30 minutes of  aerobic without signs/symptoms of physical distress    Intensity THRR unchanged      Progression   Progression Continue to progress workloads to maintain intensity without signs/symptoms of physical distress.    Average METs 1.82      Resistance Training   Training Prescription Yes    Weight 3 lb    Reps 10-15      Interval Training   Interval Training No      Recumbant Bike   Level 1    Watts 15    Minutes 15    METs 2.76      NuStep   Level 1    Minutes 15    METs 1.5      Biostep-RELP   Level 1    Minutes 30    METs 2      Track   Laps 6    Minutes 15    METs 1.33      Oxygen   Maintain Oxygen Saturation 88% or higher          Nutrition:  Target Goals: Understanding of nutrition guidelines, daily intake of sodium 1500mg , cholesterol 200mg , calories 30% from fat and 7% or less from saturated fats, daily to have 5 or more servings  of fruits and vegetables.  Education: Nutrition 1 -Group instruction provided by verbal, written material, interactive activities, discussions, models, and posters to present general guidelines for heart healthy nutrition including macronutrients, label reading, and promoting whole foods over processed counterparts. Education serves as Pensions consultant of discussion of heart healthy eating for all. Written  material provided at class time.     Education: Nutrition 2 -Group instruction provided by verbal, written material, interactive activities, discussions, models, and posters to present general guidelines for heart healthy nutrition including sodium, cholesterol, and saturated fat. Providing guidance of habit forming to improve blood pressure, cholesterol, and body weight. Written material provided at class time.     Biometrics:  Pre Biometrics - 01/30/24 1533       Pre Biometrics   Height 5' 1.3 (1.557 m)    Weight 138 lb (62.6 kg)    Waist Circumference 27.5 inches    Hip Circumference 42.8 inches    Waist to Hip Ratio 0.64 %    BMI (Calculated) 25.82    Single Leg Stand 4 seconds           Nutrition Therapy Plan and Nutrition Goals:  Nutrition Therapy & Goals - 01/30/24 1535       Nutrition Therapy   RD appointment deferred Yes      Personal Nutrition Goals   Nutrition Goal RD appointment deferred at this time      Intervention Plan   Intervention Prescribe, educate and counsel regarding individualized specific dietary modifications aiming towards targeted core components such as weight, hypertension, lipid management, diabetes, heart failure and other comorbidities.    Expected Outcomes Short Term Goal: Understand basic principles of dietary content, such as calories, fat, sodium, cholesterol and nutrients.          Nutrition Assessments:  MEDIFICTS Score Key: >=70 Need to make dietary changes  40-70 Heart Healthy Diet <= 40 Therapeutic Level Cholesterol Diet  Flowsheet Row Pulmonary Rehab from 01/30/2024 in [facility] Cardiac and Pulmonary Rehab  Picture Your Plate Total Score on Admission 53   Picture Your Plate Scores: <59 Unhealthy dietary pattern with much room for improvement. 41-50 Dietary pattern unlikely to meet recommendations for good health and room for improvement. 51-60 More healthful dietary pattern, with some room for improvement.  >60 Healthy  dietary pattern, although there may be some specific behaviors that could be improved.   Nutrition Goals Re-Evaluation:   Nutrition Goals Discharge (Final Nutrition Goals Re-Evaluation):   Psychosocial: Target Goals: Acknowledge presence or absence of significant depression and/or stress, maximize coping skills, provide positive support system. Participant is able to verbalize types and ability to use techniques and skills needed for reducing stress and depression.   Education: Stress, Anxiety, and Depression - Group verbal and visual presentation to define topics covered.  Reviews how body is impacted by stress, anxiety, and depression.  Also discusses healthy ways to reduce stress and to treat/manage anxiety and depression.  Written material provided at class time.   Education: Sleep Hygiene -Provides group verbal and written instruction about how sleep can affect your health.  Define sleep hygiene, discuss sleep cycles and impact of sleep habits. Review good sleep hygiene tips.    Initial Review & Psychosocial Screening:  Initial Psych Review & Screening - 01/23/24 1046       Initial Review   Current issues with None Identified      Family Dynamics   Good Support System? Yes      Barriers   Psychosocial barriers  to participate in program There are no identifiable barriers or psychosocial needs.;The patient should benefit from training in stress management and relaxation.      Screening Interventions   Interventions Encouraged to exercise;To provide support and resources with identified psychosocial needs    Expected Outcomes Short Term goal: Utilizing psychosocial counselor, staff and physician to assist with identification of specific Stressors or current issues interfering with healing process. Setting desired goal for each stressor or current issue identified.;Long Term Goal: Stressors or current issues are controlled or eliminated.;Short Term goal: Identification and review  with participant of any Quality of Life or Depression concerns found by scoring the questionnaire.;Long Term goal: The participant improves quality of Life and PHQ9 Scores as seen by post scores and/or verbalization of changes          Quality of Life Scores:  Scores of 19 and below usually indicate a poorer quality of life in these areas.  A difference of  2-3 points is a clinically meaningful difference.  A difference of 2-3 points in the total score of the Quality of Life Index has been associated with significant improvement in overall quality of life, self-image, physical symptoms, and general health in studies assessing change in quality of life.  PHQ-9: Review Flowsheet  More data exists      01/30/2024 10/27/2021 05/30/2021 08/03/2020 07/14/2019  Depression screen PHQ 2/9  Decreased Interest 1 1 0 1 0 0  Down, Depressed, Hopeless 0 1 1 1  0 0  PHQ - 2 Score 1 2 1 2  0 0  Altered sleeping 1 1 0 1 -  Tired, decreased energy 1 1 2 2  -  Change in appetite 1 0 0 1 -  Feeling bad or failure about yourself  0 0 0 0 -  Trouble concentrating 1 1 0 0 -  Moving slowly or fidgety/restless 1 0 0 0 -  Suicidal thoughts 0 0 0 0 -  PHQ-9 Score 6 5 3 6  -  Difficult doing work/chores Somewhat difficult Somewhat difficult Not difficult at all Not difficult at all -    Details       Multiple values from one day are sorted in reverse-chronological order        Interpretation of Total Score  Total Score Depression Severity:  1-4 = Minimal depression, 5-9 = Mild depression, 10-14 = Moderate depression, 15-19 = Moderately severe depression, 20-27 = Severe depression   Psychosocial Evaluation and Intervention:  Psychosocial Evaluation - 01/23/24 1058       Psychosocial Evaluation & Interventions   Comments Bruna is coming to the program with systolic heart failure. She notes her shortness of breath has been her main issue, but can tell a difference with her medication now. She has issue with  her  vision, so she no longer drives and relies on her daughter for transportation. When asked about stress concerns, she mentions currently she feels like she is doing okay. She is looking forward to attending the program to work on her stamina and strength    Expected Outcomes Short: attend the program for education and exercise Long: develop and maintain positive self care habits.    Continue Psychosocial Services  Follow up required by staff          Psychosocial Re-Evaluation:   Psychosocial Discharge (Final Psychosocial Re-Evaluation):   Education: Education Goals: Education classes will be provided on a weekly basis, covering required topics. Participant will state understanding/return demonstration of topics presented.  Learning Barriers/Preferences:  Learning Barriers/Preferences - 01/23/24 1046       Learning Barriers/Preferences   Learning Barriers Sight    Learning Preferences None          General Pulmonary Education Topics:  Infection Prevention: - Provides verbal and written material to individual with discussion of infection control including proper hand washing and proper equipment cleaning during exercise session. Flowsheet Row Pulmonary Rehab from 01/30/2024 in [facility] Cardiac and Pulmonary Rehab  Date 01/30/24  Educator MB  Instruction Review Code 1- Verbalizes Understanding    Falls Prevention: - Provides verbal and written material to individual with discussion of falls prevention and safety. Flowsheet Row Pulmonary Rehab from 01/30/2024 in [facility] Cardiac and Pulmonary Rehab  Date 01/30/24  Educator MB  Instruction Review Code 1- Verbalizes Understanding    Chronic Lung Disease Review: - Group verbal instruction with posters, models, PowerPoint presentations and videos,  to review new updates, new respiratory medications, new advancements in procedures and treatments. Providing information on websites and 800 numbers for continued self-education. Includes  information about supplement oxygen, available portable oxygen systems, continuous and intermittent flow rates, oxygen safety, concentrators, and Medicare reimbursement for oxygen. Explanation of Pulmonary Drugs, including class, frequency, complications, importance of spacers, rinsing mouth after steroid MDI's, and proper cleaning methods for nebulizers. Review of basic lung anatomy and physiology related to function, structure, and complications of lung disease. Review of risk factors. Discussion about methods for diagnosing sleep apnea and types of masks and machines for OSA. Includes a review of the use of types of environmental controls: home humidity, furnaces, filters, dust mite/pet prevention, HEPA vacuums. Discussion about weather changes, air quality and the benefits of nasal washing. Instruction on Warning signs, infection symptoms, calling MD promptly, preventive modes, and value of vaccinations. Review of effective airway clearance, coughing and/or vibration techniques. Emphasizing that all should Create an Action Plan. Written material provided at class time.   AED/CPR: - Group verbal and written instruction with the use of models to demonstrate the basic use of the AED with the basic ABC's of resuscitation.    Tests and Procedures:  - Group verbal and visual presentation and models provide information about basic cardiac anatomy and function. Reviews the testing methods done to diagnose heart disease and the outcomes of the test results. Describes the treatment choices: Medical Management, Angioplasty, or Coronary Bypass Surgery for treating various heart conditions including Myocardial Infarction, Angina, Valve Disease, and Cardiac Arrhythmias.  Written material provided at class time.   Medication Safety: - Group verbal and visual instruction to review commonly prescribed medications for heart and lung disease. Reviews the medication, class of the drug, and side effects. Includes the  steps to properly store meds and maintain the prescription regimen.  Written material given at graduation.   Other: -Provides group and verbal instruction on various topics (see comments)   Knowledge Questionnaire Score:  Knowledge Questionnaire Score - 01/30/24 1535       Knowledge Questionnaire Score   Pre Score 25/26           Core Components/Risk Factors/Patient Goals at Admission:  Personal Goals and Risk Factors at Admission - 01/30/24 1536       Core Components/Risk Factors/Patient Goals on Admission    Weight Management Yes;Weight Maintenance    Intervention Weight Management: Develop a combined nutrition and exercise program designed to reach desired caloric intake, while maintaining appropriate intake of nutrient and fiber, sodium and fats, and appropriate energy expenditure required for the weight goal.;Weight Management: Provide  education and appropriate resources to help participant work on and attain dietary goals.;Weight Management/Obesity: Establish reasonable short term and long term weight goals.    Admit Weight 138 lb (62.6 kg)    Goal Weight: Short Term 138 lb (62.6 kg)    Goal Weight: Long Term 138 lb (62.6 kg)    Expected Outcomes Weight Maintenance: Understanding of the daily nutrition guidelines, which includes 25-35% calories from fat, 7% or less cal from saturated fats, less than 200mg  cholesterol, less than 1.5gm of sodium, & 5 or more servings of fruits and vegetables daily;Understanding recommendations for meals to include 15-35% energy as protein, 25-35% energy from fat, 35-60% energy from carbohydrates, less than 200mg  of dietary cholesterol, 20-35 gm of total fiber daily;Understanding of distribution of calorie intake throughout the day with the consumption of 4-5 meals/snacks;Short Term: Continue to assess and modify interventions until short term weight is achieved;Long Term: Adherence to nutrition and physical activity/exercise program aimed toward  attainment of established weight goal    Diabetes Yes    Intervention Provide education about signs/symptoms and action to take for hypo/hyperglycemia.;Provide education about proper nutrition, including hydration, and aerobic/resistive exercise prescription along with prescribed medications to achieve blood glucose in normal ranges: Fasting glucose 65-99 mg/dL    Expected Outcomes Short Term: Participant verbalizes understanding of the signs/symptoms and immediate care of hyper/hypoglycemia, proper foot care and importance of medication, aerobic/resistive exercise and nutrition plan for blood glucose control.;Long Term: Attainment of HbA1C < 7%.    Heart Failure Yes    Intervention Provide a combined exercise and nutrition program that is supplemented with education, support and counseling about heart failure. Directed toward relieving symptoms such as shortness of breath, decreased exercise tolerance, and extremity edema.    Expected Outcomes Improve functional capacity of life;Short term: Attendance in program 2-3 days a week with increased exercise capacity. Reported lower sodium intake. Reported increased fruit and vegetable intake. Reports medication compliance.;Short term: Daily weights obtained and reported for increase. Utilizing diuretic protocols set by physician.;Long term: Adoption of self-care skills and reduction of barriers for early signs and symptoms recognition and intervention leading to self-care maintenance.    Hypertension Yes    Intervention Provide education on lifestyle modifcations including regular physical activity/exercise, weight management, moderate sodium restriction and increased consumption of fresh fruit, vegetables, and low fat dairy, alcohol moderation, and smoking cessation.;Monitor prescription use compliance.    Expected Outcomes Short Term: Continued assessment and intervention until BP is < 140/87mm HG in hypertensive participants. < 130/30mm HG in hypertensive  participants with diabetes, heart failure or chronic kidney disease.;Long Term: Maintenance of blood pressure at goal levels.    Lipids Yes    Intervention Provide education and support for participant on nutrition & aerobic/resistive exercise along with prescribed medications to achieve LDL 70mg , HDL >40mg .    Expected Outcomes Short Term: Participant states understanding of desired cholesterol values and is compliant with medications prescribed. Participant is following exercise prescription and nutrition guidelines.;Long Term: Cholesterol controlled with medications as prescribed, with individualized exercise RX and with personalized nutrition plan. Value goals: LDL < 70mg , HDL > 40 mg.          Education:Diabetes - Individual verbal and written instruction to review signs/symptoms of diabetes, desired ranges of glucose level fasting, after meals and with exercise. Acknowledge that pre and post exercise glucose checks will be done for 3 sessions at entry of program. Flowsheet Row Pulmonary Rehab from 01/30/2024 in [facility] Cardiac and Pulmonary Rehab  Date  01/30/24  Educator MB  Instruction Review Code 1- Verbalizes Understanding    Know Your Numbers and Heart Failure: - Group verbal and visual instruction to discuss disease risk factors for cardiac and pulmonary disease and treatment options.  Reviews associated critical values for Overweight/Obesity, Hypertension, Cholesterol, and Diabetes.  Discusses basics of heart failure: signs/symptoms and treatments.  Introduces Heart Failure Zone chart for action plan for heart failure. Written material provided at class time.   Core Components/Risk Factors/Patient Goals Review:    Core Components/Risk Factors/Patient Goals at Discharge (Final Review):    ITP Comments:  ITP Comments     Row Name 01/23/24 1058 01/30/24 1524 02/06/24 0807 02/27/24 0957     ITP Comments Initial phone call completed. [facility]. EP  Orientation scheduled for Wednesday 7/30 at 1:30p. Completed and gym orientation for respiratory care services. Initial ITP created and sent for review to Dr. Faud Aleskerov, Medical Director. First full day of exercise!  Patient was oriented to gym and equipment including functions, settings, policies, and procedures.  Patient's individual exercise prescription and treatment plan were reviewed.  All starting workloads were established based on the results of the 6 minute walk test done at initial orientation visit.  The plan for exercise progression was also introduced and progression will be customized based on patient's performance and goals. 30 Day review completed. Medical Director ITP review done; changes made as directed and signed approval by Medical Director.New to Program.       Comments: 30 day review

## 2024-03-05 ENCOUNTER — Encounter: Attending: Internal Medicine

## 2024-03-05 DIAGNOSIS — I5022 Chronic systolic (congestive) heart failure: Secondary | ICD-10-CM | POA: Insufficient documentation

## 2024-03-05 NOTE — Progress Notes (Signed)
 Daily Session Note  Patient Details  Name: Erin Middleton MRN: [id number] Date of Birth: [birth year] Referring Provider:   Conrad Ports Pulmonary Rehab from 01/30/2024 in [facility] Cardiac and Pulmonary Rehab  Referring Provider Florencio Kava, MD    Encounter Date: 03/05/2024  Check In:  Session Check In - 03/05/24 0738       Check-In   Supervising physician immediately available to respond to emergencies See telemetry face sheet for immediately available ER MD    Location [facility]-Cardiac & Pulmonary Rehab    Staff Present Burnard Davenport RN,BSN,MPA;Maxon Conetta BS, Exercise Physiologist;Joseph Rolinda RCP,RRT,BSRT;Jason Elnor RDN,LDN    Virtual Visit No    Medication changes reported     No    Fall or balance concerns reported    No    Warm-up and Cool-down Performed on first and last piece of equipment    Resistance Training Performed Yes    VAD Patient? No    PAD/SET Patient? No      Pain Assessment   Currently in Pain? No/denies             Social History   Tobacco Use  Smoking Status Never  Smokeless Tobacco Never    Goals Met:  Proper associated with RPD/PD & O2 Sat Independence with exercise equipment Using PLB without cueing & demonstrates good technique Exercise tolerated well No report of concerns or symptoms today Strength training completed today  Goals Unmet:  Not Applicable  Comments: Pt able to follow exercise prescription today without complaint.  Will continue to monitor for progression.    Dr. Oneil Pinal is Medical Director for [facility] Cardiac Rehabilitation.  Dr. Fuad Aleskerov is Medical Director for [facility] Pulmonary Rehabilitation.

## 2024-03-10 ENCOUNTER — Encounter

## 2024-03-10 DIAGNOSIS — I5022 Chronic systolic (congestive) heart failure: Secondary | ICD-10-CM

## 2024-03-10 NOTE — Progress Notes (Signed)
 Daily Session Note  Patient Details  Name: Erin Middleton MRN: [id number] Date of Birth: May 28, 1946 Referring Provider:   Conrad Ports Pulmonary Rehab from 01/30/2024 in [facility] Cardiac and Pulmonary Rehab  Referring Provider Florencio Kava, MD    Encounter Date: 03/10/2024  Check In:  Session Check In - 03/10/24 0748       Check-In   Supervising physician immediately available to respond to emergencies See telemetry face sheet for immediately available ER MD    Location [facility]-Cardiac & Pulmonary Rehab    Staff Present Burnard Davenport RN,BSN,MPA;Joseph [facility] Dyane BS, ACSM CEP, Exercise Physiologist;Jason Elnor RDN,LDN    Virtual Visit No    Medication changes reported     No    Fall or balance concerns reported    No    Warm-up and Cool-down Performed on first and last piece of equipment    Resistance Training Performed Yes    VAD Patient? No    PAD/SET Patient? No      Pain Assessment   Currently in Pain? No/denies             Social History   Tobacco Use  Smoking Status Never  Smokeless Tobacco Never    Goals Met:  Proper associated with RPD/PD & O2 Sat Independence with exercise equipment Using PLB without cueing & demonstrates good technique Exercise tolerated well No report of concerns or symptoms today Strength training completed today  Goals Unmet:  Not Applicable  Comments: Pt able to follow exercise prescription today without complaint.  Will continue to monitor for progression.      Reviewed home exercise with pt today from 7:50 am to 8:00am.  Pt plans to walk for exercise.  Reviewed THR, pulse, RPE, sign and symptoms, pulse oximetery and when to call 911 or MD.  Also discussed weather considerations and indoor options.  Pt voiced understanding.     Dr. Oneil Pinal is Medical Director for [facility] Cardiac Rehabilitation.  Dr. Fuad Aleskerov is Medical Director for [facility] Pulmonary Rehabilitation.

## 2024-03-12 ENCOUNTER — Encounter

## 2024-03-12 DIAGNOSIS — I5022 Chronic systolic (congestive) heart failure: Secondary | ICD-10-CM

## 2024-03-12 NOTE — Progress Notes (Signed)
 Daily Session Note  Patient Details  Name: Erin Middleton MRN: [id number] Date of Birth: 04-01-1946 Referring Provider:   Conrad Ports Pulmonary Rehab from 01/30/2024 in [facility] Cardiac and Pulmonary Rehab  Referring Provider Florencio Kava, MD    Encounter Date: 03/12/2024  Check In:  Session Check In - 03/12/24 0739       Check-In   Supervising physician immediately available to respond to emergencies See telemetry face sheet for immediately available ER MD    Location [facility]-Cardiac & Pulmonary Rehab    Staff Present Burnard Davenport RN,BSN,MPA;Maxon Conetta BS, Exercise Physiologist;Joseph Rolinda RCP,RRT,BSRT;Jason Elnor RDN,LDN    Virtual Visit No    Medication changes reported     No    Fall or balance concerns reported    No    Warm-up and Cool-down Performed on first and last piece of equipment    Resistance Training Performed Yes    VAD Patient? No    PAD/SET Patient? No      Pain Assessment   Currently in Pain? No/denies             Social History   Tobacco Use  Smoking Status Never  Smokeless Tobacco Never    Goals Met:  Independence with exercise equipment Exercise tolerated well No report of concerns or symptoms today Strength training completed today  Goals Unmet:  Not Applicable  Comments: Pt able to follow exercise prescription today without complaint.  Will continue to monitor for progression.    Dr. Oneil Pinal is Medical Director for [facility] Cardiac Rehabilitation.  Dr. Fuad Aleskerov is Medical Director for [facility] Pulmonary Rehabilitation.

## 2024-03-17 ENCOUNTER — Encounter

## 2024-03-17 DIAGNOSIS — I5022 Chronic systolic (congestive) heart failure: Secondary | ICD-10-CM

## 2024-03-17 NOTE — Progress Notes (Signed)
 Daily Session Note  Patient Details  Name: Erin Middleton MRN: [id number] Date of Birth: 09-08-1945 Referring Provider:   Conrad Ports Pulmonary Rehab from 01/30/2024 in [facility] Cardiac and Pulmonary Rehab  Referring Provider Florencio Kava, MD    Encounter Date: 03/17/2024  Check In:  Session Check In - 03/17/24 0730       Check-In   Supervising physician immediately available to respond to emergencies See telemetry face sheet for immediately available ER MD    Location [facility]-Cardiac & Pulmonary Rehab    Staff Present Burnard Davenport RN,BSN,MPA;Joseph [facility] Dyane BS, ACSM CEP, Exercise Physiologist;Noah Tickle, BS, Exercise Physiologist    Virtual Visit No    Medication changes reported     No    Fall or balance concerns reported    No    Warm-up and Cool-down Performed on first and last piece of equipment    Resistance Training Performed Yes    VAD Patient? No    PAD/SET Patient? No      Pain Assessment   Currently in Pain? No/denies             Social History   Tobacco Use  Smoking Status Never  Smokeless Tobacco Never    Goals Met:  Proper associated with RPD/PD & O2 Sat Independence with exercise equipment Using PLB without cueing & demonstrates good technique Exercise tolerated well No report of concerns or symptoms today Strength training completed today  Goals Unmet:  Not Applicable  Comments: Pt able to follow exercise prescription today without complaint.  Will continue to monitor for progression.    Dr. Oneil Pinal is Medical Director for [facility] Cardiac Rehabilitation.  Dr. Fuad Aleskerov is Medical Director for [facility] Pulmonary Rehabilitation.

## 2024-03-19 ENCOUNTER — Encounter

## 2024-03-19 DIAGNOSIS — I5022 Chronic systolic (congestive) heart failure: Secondary | ICD-10-CM

## 2024-03-19 NOTE — Progress Notes (Signed)
 Daily Session Note  Patient Details  Name: Erin Middleton MRN: [id number] Date of Birth: 06/12/1946 Referring Provider:   Conrad Ports Pulmonary Rehab from 01/30/2024 in [facility] Cardiac and Pulmonary Rehab  Referring Provider Florencio Kava, MD    Encounter Date: 03/19/2024  Check In:  Session Check In - 03/19/24 0750       Check-In   Supervising physician immediately available to respond to emergencies See telemetry face sheet for immediately available ER MD    Location [facility]-Cardiac & Pulmonary Rehab    Staff Present Burnard Davenport RN,BSN,MPA;Joseph Rolinda RCP,RRT,BSRT;Margaret Best, MS, Exercise Physiologist    Virtual Visit No    Medication changes reported     No    Fall or balance concerns reported    No    Warm-up and Cool-down Performed on first and last piece of equipment    Resistance Training Performed Yes    VAD Patient? No    PAD/SET Patient? No      Pain Assessment   Currently in Pain? No/denies             Social History   Tobacco Use  Smoking Status Never  Smokeless Tobacco Never    Goals Met:  Proper associated with RPD/PD & O2 Sat Independence with exercise equipment Using PLB without cueing & demonstrates good technique Exercise tolerated well No report of concerns or symptoms today Strength training completed today  Goals Unmet:  Not Applicable  Comments: Pt able to follow exercise prescription today without complaint.  Will continue to monitor for progression.    Dr. Oneil Pinal is Medical Director for [facility] Cardiac Rehabilitation.  Dr. Fuad Aleskerov is Medical Director for [facility] Pulmonary Rehabilitation.

## 2024-03-24 ENCOUNTER — Encounter

## 2024-03-26 ENCOUNTER — Encounter

## 2024-03-26 DIAGNOSIS — I5022 Chronic systolic (congestive) heart failure: Secondary | ICD-10-CM | POA: Diagnosis not present

## 2024-03-26 NOTE — Progress Notes (Signed)
 Daily Session Note  Patient Details  Name: Erin Middleton MRN: [id number] Date of Birth: Jul 06, 1945 Referring Provider:   Conrad Ports Pulmonary Rehab from 01/30/2024 in [facility] Cardiac and Pulmonary Rehab  Referring Provider Florencio Kava, MD    Encounter Date: 03/26/2024  Check In:  Session Check In - 03/26/24 0729       Check-In   Supervising physician immediately available to respond to emergencies See telemetry face sheet for immediately available ER MD    Location [facility]-Cardiac & Pulmonary Rehab    Staff Present Burnard Davenport [facility] Peggi, RN, DNP, NE-BC;Maxon Conetta BS, Exercise Physiologist;Joseph Rolinda [facility] Hobert Best, MS, Exercise Physiologist    Virtual Visit No    Medication changes reported     No    Fall or balance concerns reported    No    Warm-up and Cool-down Performed on first and last piece of equipment    Resistance Training Performed Yes    VAD Patient? No    PAD/SET Patient? No      Pain Assessment   Currently in Pain? No/denies             Social History   Tobacco Use  Smoking Status Never  Smokeless Tobacco Never    Goals Met:  Proper associated with RPD/PD & O2 Sat Independence with exercise equipment Using PLB without cueing & demonstrates good technique Exercise tolerated well No report of concerns or symptoms today Strength training completed today  Goals Unmet:  Not Applicable  Comments: Pt able to follow exercise prescription today without complaint.  Will continue to monitor for progression.    Dr. Oneil Pinal is Medical Director for [facility] Cardiac Rehabilitation.  Dr. Fuad Aleskerov is Medical Director for [facility] Pulmonary Rehabilitation.

## 2024-03-26 NOTE — Progress Notes (Signed)
 Pulmonary Individual Treatment Plan  Patient Details  Name: Erin Middleton MRN: [id number] Date of Birth: 05-15-1946 Referring Provider:   Conrad [facility]s Pulmonary Rehab from 01/30/2024 in [facility] Cardiac and Pulmonary Rehab  Referring Provider Florencio Kava, MD    Initial Encounter Date:  Flowsheet Row Pulmonary Rehab from 01/30/2024 in [facility] Cardiac and Pulmonary Rehab  Date 01/30/24    Visit Diagnosis: Heart failure, chronic systolic (HCC)  Patient's Home Medications on Admission:  Current Outpatient Medications:    ACCU-CHEK GUIDE test strip, CHECK BLOOD SUGAR EVERY DAY, Disp: 100 strip, Rfl: 2   Accu-Chek Softclix Lancets lancets, CHECK BLOOD SUGAR ONCE DAILY, Disp: 100 each, Rfl: 12   amLODipine  (NORVASC ) 5 MG tablet, TAKE 1 TABLET BY MOUTH EVERY DAY IN THE EVENING (Patient not taking: Reported on 02/19/2024), Disp: 90 tablet, Rfl: 2   blood glucose meter kit and supplies, Dispense based on patient and insurance preference. Check blood sugar once a day (FOR ICD-10 E10.9, E11.9)., Disp: 1 each, Rfl: 0   Cholecalciferol  10 MCG (400 UNIT) CAPS, , Disp: , Rfl:    furosemide (LASIX) 40 MG tablet, Take 40 mg by mouth. (Patient taking differently: Take 20 mg by mouth daily.), Disp: , Rfl:    metFORMIN  (GLUCOPHAGE ) 500 MG tablet, Take 1 tablet (500 mg total) by mouth 2 (two) times daily with a meal., Disp: 180 tablet, Rfl: 1   Multiple Vitamins-Minerals (PRESERVISION/LUTEIN ) CAPS, Take 1 capsule by mouth at bedtime., Disp: , Rfl:    omeprazole  (PRILOSEC) 20 MG capsule, Take 20 mg by mouth., Disp: , Rfl:    rosuvastatin  (CRESTOR ) 40 MG tablet, Take 1 tablet by mouth at bedtime., Disp: , Rfl:    spironolactone (ALDACTONE) 25 MG tablet, Take 25 mg by mouth daily. (Patient not taking: Reported on 02/19/2024), Disp: , Rfl:    telmisartan (MICARDIS) 80 MG tablet, TAKE 1 TABLET BY MOUTH EVERY DAY (Patient not taking: Reported on 02/19/2024), Disp: 90 tablet, Rfl: 3   vitamin E  400 UNIT capsule, Take  400 Units by mouth daily. 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 breast cancer - chemotherapy   Cancer (HCC)    Breast   Diabetes mellitus without complication (HCC)    GERD (gastroesophageal reflux disease)    Headache    migraines h/o   Hypertension    Macular degeneration of both eyes, unspecified type    pt states its pretty advanced.   Personal history of chemotherapy     Tobacco Use: Social History   Tobacco Use  Smoking Status Never  Smokeless Tobacco Never    Labs: Review Flowsheet  More data exists      Latest Ref Rng & Units 03/09/2020 08/03/2020 12/21/2020 05/30/2021 10/27/2021  Labs for ITP Cardiac and Pulmonary Rehab  Cholestrol 100 - 199 mg/dL - 879  - 887  -  LDL (calc) 0 - 99 mg/dL - 47  - 38  -  HDL-C >60 mg/dL - 57  - 60  -  Trlycerides 0 - 149 mg/dL - 81  - 67  -  Hemoglobin A1c 4.8 - 5.6 % 6.5  6.6  7.0  6.8  7.7      Pulmonary Assessment Scores:  Pulmonary Assessment Scores     Row Name 01/30/24 1534         ADL UCSD   ADL Phase Entry  SOB Score total 31     Rest 1     Walk 1     Stairs 4     Bath 0     Dress 0     Shop 3       CAT Score   CAT Score 17       mMRC Score   mMRC Score 3        UCSD: Self-administered rating of dyspnea associated with activities of daily living (ADLs) 6-point scale (0 = not at all to 5 = maximal or unable to do because of breathlessness)  Scoring Scores range from 0 to 120.  Minimally important difference is 5 units  CAT: CAT can identify the health impairment of COPD patients and is better correlated with disease progression.  CAT has a scoring range of zero to 40. The CAT score is classified into four groups of low (less than 10), medium (10 - 20), high (21-30) and very high (31-40) based on the impact level of disease on health status. A CAT score over 10 suggests significant symptoms.   A worsening CAT score could be explained by an exacerbation, poor medication adherence, poor inhaler technique, or progression of COPD or comorbid conditions.  CAT MCID is 2 points  mMRC: mMRC (Modified Medical Research Council) Dyspnea Scale is used to assess the degree of baseline functional disability in patients of respiratory disease due to dyspnea. No minimal important difference is established. A decrease in score of 1 point or greater is considered a positive change.   Pulmonary Function Assessment:   Exercise Target Goals: Exercise Program Goal: Individual exercise prescription set using results from initial 6 min walk test and THRR while considering  patient's activity barriers and safety.   Exercise Prescription Goal: Initial exercise prescription builds to 30-45 minutes a day of aerobic activity, 2-3 days per week.  Home exercise guidelines will be given to patient during program as part of exercise prescription that the participant will acknowledge.  Education: Aerobic Exercise: - Group verbal and visual presentation on the components of exercise prescription. Introduces F.I.T.T principle from ACSM for exercise prescriptions.  Reviews F.I.T.T. principles of aerobic exercise including progression. Written material provided at class time.   Education: Resistance Exercise: - Group verbal and visual presentation on the components of exercise prescription. Introduces F.I.T.T principle from ACSM for exercise prescriptions  Reviews F.I.T.T. principles of resistance exercise including progression. Written material provided at class time.    Education: Exercise & Equipment Safety: - Individual verbal instruction and demonstration of equipment use and safety with use of the equipment. Flowsheet Row Pulmonary Rehab from 01/30/2024 in [facility] Cardiac and Pulmonary Rehab  Date 01/30/24  Educator MB  Instruction Review Code 1- Verbalizes Understanding    Education: Exercise Physiology &  General Exercise Guidelines: - Group verbal and written instruction with models to review the exercise physiology of the cardiovascular system and associated critical values. Provides general exercise guidelines with specific guidelines to those with heart or lung disease.    Education: Flexibility, Balance, Mind/Body Relaxation: - Group verbal and visual presentation with interactive activity on the components of exercise prescription. Introduces F.I.T.T principle from ACSM for exercise prescriptions. Reviews F.I.T.T. principles of flexibility and balance exercise training including progression. Also discusses the mind body connection.  Reviews various relaxation techniques to help reduce and manage stress (i.e. Deep breathing, progressive muscle relaxation, and visualization). Balance handout provided to take home. Written material provided at class time.   Activity Barriers &  Risk Stratification:  Activity Barriers & Cardiac Risk Stratification - 01/30/24 1526       Activity Barriers & Cardiac Risk Stratification   Activity Barriers Back Problems;Shortness of Breath;Balance Concerns;Left Knee Replacement          6 Minute Walk:  6 Minute Walk     Row Name 01/30/24 1524         6 Minute Walk   Phase Initial     Distance 830 feet     Walk Time 6 minutes     # of Rest Breaks 0     MPH 1.57     METS 1.68     RPE 11     Perceived Dyspnea  1     VO2 Peak 5.89     Symptoms Yes (comment)     Comments 6/10 back pain     Resting HR 81 bpm     Resting BP 128/60     Resting Oxygen Saturation  96 %     Exercise Oxygen Saturation  during 6 min walk 96 %     Max Ex. HR 97 bpm     Max Ex. BP 160/80     2 Minute Post BP 138/72       Interval HR   1 Minute HR 89     2 Minute HR 95     3 Minute HR 97     4 Minute HR 94     5 Minute HR 94     6 Minute HR 97     2 Minute Post HR 80     Interval Heart Rate? Yes       Interval Oxygen   Interval Oxygen? Yes     Baseline Oxygen  Saturation % 96 %     1 Minute Oxygen Saturation % 97 %     1 Minute Liters of Oxygen 0 L     2 Minute Oxygen Saturation % 97 %     2 Minute Liters of Oxygen 0 L     3 Minute Oxygen Saturation % 98 %     3 Minute Liters of Oxygen 0 L     4 Minute Oxygen Saturation % 96 %     4 Minute Liters of Oxygen 0 L     5 Minute Oxygen Saturation % 96 %     5 Minute Liters of Oxygen 0 L     6 Minute Oxygen Saturation % 96 %     6 Minute Liters of Oxygen 0 L     2 Minute Post Oxygen Saturation % 97 %     2 Minute Post Liters of Oxygen 0 L       Oxygen Initial Assessment:  Oxygen Initial Assessment - 01/23/24 1108       Home Oxygen   Home Oxygen Device None    Sleep Oxygen Prescription None    Home Exercise Oxygen Prescription None    Home Resting Oxygen Prescription None    Compliance with Home Oxygen Use Yes      Intervention   Short Term Goals To learn and understand importance of maintaining oxygen saturations>88%;To learn and demonstrate proper pursed lip breathing techniques or other breathing techniques. ;To learn and demonstrate proper use of respiratory medications;To learn and understand importance of monitoring SPO2 with pulse oximeter and demonstrate accurate use of the pulse oximeter.    Long  Term Goals Verbalizes importance of monitoring SPO2 with pulse oximeter and return  demonstration;Maintenance of O2 saturations>88%;Exhibits proper breathing techniques, such as pursed lip breathing or other method taught during program session;Compliance with respiratory medication          Oxygen Re-Evaluation:  Oxygen Re-Evaluation     Row Name 02/06/24 0808             Home Oxygen   Home Oxygen Device None       Sleep Oxygen Prescription None       Home Exercise Oxygen Prescription None       Home Resting Oxygen Prescription None       Compliance with Home Oxygen Use Yes         Goals/Expected Outcomes   Short Term Goals To learn and understand importance of maintaining  oxygen saturations>88%;To learn and demonstrate proper pursed lip breathing techniques or other breathing techniques. ;To learn and demonstrate proper use of respiratory medications;To learn and understand importance of monitoring SPO2 with pulse oximeter and demonstrate accurate use of the pulse oximeter.       Long  Term Goals Verbalizes importance of monitoring SPO2 with pulse oximeter and return demonstration;Maintenance of O2 saturations>88%;Exhibits proper breathing techniques, such as pursed lip breathing or other method taught during program session;Compliance with respiratory medication       Comments Reviewed PLB technique with pt.  Talked about how it works and it's importance in maintaining their exercise saturations.       Goals/Expected Outcomes Short: Become more profiecient at using PLB. Long: Become independent at using PLB.          Oxygen Discharge (Final Oxygen Re-Evaluation):  Oxygen Re-Evaluation - 02/06/24 0808       Home Oxygen   Home Oxygen Device None    Sleep Oxygen Prescription None    Home Exercise Oxygen Prescription None    Home Resting Oxygen Prescription None    Compliance with Home Oxygen Use Yes      Goals/Expected Outcomes   Short Term Goals To learn and understand importance of maintaining oxygen saturations>88%;To learn and demonstrate proper pursed lip breathing techniques or other breathing techniques. ;To learn and demonstrate proper use of respiratory medications;To learn and understand importance of monitoring SPO2 with pulse oximeter and demonstrate accurate use of the pulse oximeter.    Long  Term Goals Verbalizes importance of monitoring SPO2 with pulse oximeter and return demonstration;Maintenance of O2 saturations>88%;Exhibits proper breathing techniques, such as pursed lip breathing or other method taught during program session;Compliance with respiratory medication    Comments Reviewed PLB technique with pt.  Talked about how it works and it's  importance in maintaining their exercise saturations.    Goals/Expected Outcomes Short: Become more profiecient at using PLB. Long: Become independent at using PLB.          Initial Exercise Prescription:  Initial Exercise Prescription - 01/30/24 1500       Date of Initial Exercise RX and Referring Provider   Date 01/30/24    Referring Provider Florencio Kava, MD      Oxygen   Maintain Oxygen Saturation 88% or higher      Recumbant Bike   Level 1    RPM 50    Watts 15    Minutes 15    METs 1.68      NuStep   Level 1    SPM 80    Minutes 15    METs 1.68      Biostep-RELP   Level 1    SPM 50  Minutes 15    METs 1.68      Track   Laps 20   Hallway   Minutes 15    METs 2.09      Prescription Details   Frequency (times per week) 2    Duration Progress to 30 minutes of continuous aerobic without signs/symptoms of physical distress      Intensity   THRR 40-80% of Max Heartrate 104-128    Ratings of Perceived Exertion 11-13    Perceived Dyspnea 0-4      Progression   Progression Continue to progress workloads to maintain intensity without signs/symptoms of physical distress.      Resistance Training   Training Prescription Yes    Weight 3 lb    Reps 10-15          Perform Capillary Blood Glucose checks as needed.  Exercise Prescription Changes:   Exercise Prescription Changes     Row Name 01/30/24 1500 02/22/24 0800 03/05/24 1500 03/10/24 0800 03/20/24 0800     Response to Exercise   Blood Pressure (Admit) 128/60 122/62 104/62 -- 118/64   Blood Pressure (Exercise) 160/80 142/68 140/82 -- 136/70   Blood Pressure (Exit) 138/72 126/62 104/62 -- 122/64   Heart Rate (Admit) 81 bpm 80 bpm 80 bpm -- 81 bpm   Heart Rate (Exercise) 97 bpm 83 bpm 84 bpm -- 82 bpm   Heart Rate (Exit) 80 bpm 80 bpm 80 bpm -- 82 bpm   Oxygen Saturation (Admit) 96 % 97 % 96 % -- 96 %   Oxygen Saturation (Exercise) 98 % 96 % 95 % -- 95 %   Oxygen Saturation (Exit) 97 % 97  % 98 % -- 97 %   Rating of Perceived Exertion (Exercise) 11 13 13  -- 14   Perceived Dyspnea (Exercise) 1 1 2  -- 1   Symptoms 6/10 back pain -- none -- none   Comments results First three days of exercise -- -- --   Duration -- Progress to 30 minutes of  aerobic without signs/symptoms of physical distress Progress to 30 minutes of  aerobic without signs/symptoms of physical distress Progress to 30 minutes of  aerobic without signs/symptoms of physical distress Progress to 30 minutes of  aerobic without signs/symptoms of physical distress   Intensity -- THRR unchanged THRR unchanged THRR unchanged THRR unchanged     Progression   Progression -- Continue to progress workloads to maintain intensity without signs/symptoms of physical distress. Continue to progress workloads to maintain intensity without signs/symptoms of physical distress. Continue to progress workloads to maintain intensity without signs/symptoms of physical distress. Continue to progress workloads to maintain intensity without signs/symptoms of physical distress.   Average METs 1.68 1.82 1.65 1.65 1.87     Resistance Training   Training Prescription -- Yes Yes Yes Yes   Weight -- 3 lb 3 lb 3 lb 3 lb   Reps -- 10-15 10-15 10-15 10-15     Interval Training   Interval Training -- No No No No     Recumbant Bike   Level -- 1 -- -- 2   Watts -- 15 -- -- 16   Minutes -- 15 -- -- 15   METs -- 2.76 -- -- 2.75     NuStep   Level -- 1 5 5 4    Minutes -- 15 30 30 30    METs -- 1.5 1.8 1.8 1.7     Biostep-RELP   Level -- 1 -- -- 1  Minutes -- 30 -- -- 15   METs -- 2 -- -- 2     Track   Laps -- 6 8 8 8    Minutes -- 15 15 15 15    METs -- 1.33 1.44 1.44 1.44     Home Exercise Plan   Plans to continue exercise at -- -- -- Home (comment)  walking Home (comment)  walking   Frequency -- -- -- Add 2 additional days to program exercise sessions. Add 2 additional days to program exercise sessions.   Initial Home Exercises  Provided -- -- -- 03/10/24 03/10/24     Oxygen   Maintain Oxygen Saturation -- 88% or higher 88% or higher 88% or higher 88% or higher      Exercise Comments:   Exercise Comments     Row Name 02/06/24 0807           Exercise Comments First full day of exercise!  Patient was oriented to gym and equipment including functions, settings, policies, and procedures.  Patient's individual exercise prescription and treatment plan were reviewed.  All starting workloads were established based on the results of the 6 minute walk test done at initial orientation visit.  The plan for exercise progression was also introduced and progression will be customized based on patient's performance and goals.          Exercise Goals and Review:   Exercise Goals     Row Name 01/30/24 1532             Exercise Goals   Increase Physical Activity Yes       Intervention Provide advice, education, support and counseling about physical activity/exercise needs.;Develop an individualized exercise prescription for aerobic and resistive training based on initial evaluation findings, risk stratification, comorbidities and participant's personal goals.       Expected Outcomes Short Term: Attend rehab on a regular basis to increase amount of physical activity.;Long Term: Add in home exercise to make exercise part of routine and to increase amount of physical activity.;Long Term: Exercising regularly at least 3-5 days a week.       Increase Strength and Stamina Yes       Intervention Provide advice, education, support and counseling about physical activity/exercise needs.;Develop an individualized exercise prescription for aerobic and resistive training based on initial evaluation findings, risk stratification, comorbidities and participant's personal goals.       Expected Outcomes Short Term: Increase workloads from initial exercise prescription for resistance, speed, and METs.;Short Term: Perform resistance training  exercises routinely during rehab and add in resistance training at home;Long Term: Improve cardiorespiratory fitness, muscular endurance and strength as measured by increased METs and functional capacity ( )       Able to understand and use rate of perceived exertion (RPE) scale Yes       Intervention Provide education and explanation on how to use RPE scale       Expected Outcomes Short Term: Able to use RPE daily in rehab to express subjective intensity level;Long Term:  Able to use RPE to guide intensity level when exercising independently       Able to understand and use Dyspnea scale Yes       Intervention Provide education and explanation on how to use Dyspnea scale       Expected Outcomes Short Term: Able to use Dyspnea scale daily in rehab to express subjective sense of shortness of breath during exertion;Long Term: Able to use Dyspnea scale to guide intensity level  when exercising independently       Knowledge and understanding of Target Heart Rate Range (THRR) Yes       Intervention Provide education and explanation of THRR including how the numbers were predicted and where they are located for reference       Expected Outcomes Short Term: Able to state/look up THRR;Short Term: Able to use daily as guideline for intensity in rehab;Long Term: Able to use THRR to govern intensity when exercising independently       Able to check pulse independently Yes       Intervention Provide education and demonstration on how to check pulse in carotid and radial arteries.;Review the importance of being able to check your own pulse for safety during independent exercise       Expected Outcomes Long Term: Able to check pulse independently and accurately;Short Term: Able to explain why pulse checking is important during independent exercise       Understanding of Exercise Prescription Yes       Intervention Provide education, explanation, and written materials on patient's individual exercise prescription        Expected Outcomes Short Term: Able to explain program exercise prescription;Long Term: Able to explain home exercise prescription to exercise independently          Exercise Goals Re-Evaluation :  Exercise Goals Re-Evaluation     Row Name 02/06/24 [phone] 02/22/24 0852 03/05/24 1546 03/10/24 0816 03/20/24 0856     Exercise Goal Re-Evaluation   Exercise Goals Review Increase Physical Activity;Able to understand and use rate of perceived exertion (RPE) scale;Knowledge and understanding of Target Heart Rate Range (THRR);Understanding of Exercise Prescription;Increase Strength and Stamina;Able to understand and use Dyspnea scale;Able to check pulse independently Increase Physical Activity;Increase Strength and Stamina;Understanding of Exercise Prescription Increase Physical Activity;Increase Strength and Stamina;Understanding of Exercise Prescription Increase Physical Activity;Able to understand and use rate of perceived exertion (RPE) scale;Knowledge and understanding of Target Heart Rate Range (THRR);Understanding of Exercise Prescription;Increase Strength and Stamina;Able to understand and use Dyspnea scale;Able to check pulse independently Increase Physical Activity;Understanding of Exercise Prescription;Increase Strength and Stamina   Comments Reviewed RPE and dyspnea scale, THR and program prescription with pt today.  Pt voiced understanding and was given a copy of goals to take home. Erin Middleton is off to a good start in rehab. She did well at level 1 on the T4 nustep, biostep, and the recumbent bike. She also did well walking six laps on the track. We will continue to monitor her progress in the program. Erin Middleton is off to a good start in rehab. She increased to level 5 on the T4 nustep. She increased her laps on the track to 8 laps. We will continue to monitor her progress in the program. Reviewed home exercise with pt today from 7:50 am to 8:00am.  Pt plans to walk for exercise.  Reviewed THR, pulse, RPE, sign  and symptoms, pulse oximetery and when to call 911 or MD.  Also discussed weather considerations and indoor options.  Pt voiced understanding. Erin Middleton is doing well in rehab. She increased to level 2 on the recumbent bike. She maintained 8 laps on the track and level 1 on the biostep. She decreased down slightly to level 4 on the T4 nustep for 30 minutes. We will continue to monitor her progress in the program.   Expected Outcomes Short: Use RPE daily to regulate intensity. Long: Follow program prescription in THR. -- Short: Continue to progressively increase T4 nustep workload and track  laps. Long: Continue exercise to improve strength and stamina. Short: add 1-2 days a week of exercise on off days of rehab. Long: maintain independent exercise routine upon graduation from program. Short: Try for more laps on the track and increase back to level 5 on the T4 nustep. Long: Continue exercise to improve strength and stamina.      Discharge Exercise Prescription (Final Exercise Prescription Changes):  Exercise Prescription Changes - 03/20/24 0800       Response to Exercise   Blood Pressure (Admit) 118/64    Blood Pressure (Exercise) 136/70    Blood Pressure (Exit) 122/64    Heart Rate (Admit) 81 bpm    Heart Rate (Exercise) 82 bpm    Heart Rate (Exit) 82 bpm    Oxygen Saturation (Admit) 96 %    Oxygen Saturation (Exercise) 95 %    Oxygen Saturation (Exit) 97 %    Rating of Perceived Exertion (Exercise) 14    Perceived Dyspnea (Exercise) 1    Symptoms none    Duration Progress to 30 minutes of  aerobic without signs/symptoms of physical distress    Intensity THRR unchanged      Progression   Progression Continue to progress workloads to maintain intensity without signs/symptoms of physical distress.    Average METs 1.87      Resistance Training   Training Prescription Yes    Weight 3 lb    Reps 10-15      Interval Training   Interval Training No      Recumbant Bike   Level 2    Watts 16     Minutes 15    METs 2.75      NuStep   Level 4    Minutes 30    METs 1.7      Biostep-RELP   Level 1    Minutes 15    METs 2      Track   Laps 8    Minutes 15    METs 1.44      Home Exercise Plan   Plans to continue exercise at Home (comment)   walking   Frequency Add 2 additional days to program exercise sessions.    Initial Home Exercises Provided 03/10/24      Oxygen   Maintain Oxygen Saturation 88% or higher          Nutrition:  Target Goals: Understanding of nutrition guidelines, daily intake of sodium 1500mg , cholesterol 200mg , calories 30% from fat and 7% or less from saturated fats, daily to have 5 or more servings of fruits and vegetables.  Education: Nutrition 1 -Group instruction provided by verbal, written material, interactive activities, discussions, models, and posters to present general guidelines for heart healthy nutrition including macronutrients, label reading, and promoting whole foods over processed counterparts. Education serves as Pensions consultant of discussion of heart healthy eating for all. Written material provided at class time.     Education: Nutrition 2 -Group instruction provided by verbal, written material, interactive activities, discussions, models, and posters to present general guidelines for heart healthy nutrition including sodium, cholesterol, and saturated fat. Providing guidance of habit forming to improve blood pressure, cholesterol, and body weight. Written material provided at class time.     Biometrics:  Pre Biometrics - 01/30/24 1533       Pre Biometrics   Height 5' 1.3 (1.557 m)    Weight 138 lb (62.6 kg)    Waist Circumference 27.5 inches    Hip Circumference 42.8 inches  Waist to Hip Ratio 0.64 %    BMI (Calculated) 25.82    Single Leg Stand 4 seconds           Nutrition Therapy Plan and Nutrition Goals:  Nutrition Therapy & Goals - 01/30/24 1535       Nutrition Therapy   RD appointment deferred Yes       Personal Nutrition Goals   Nutrition Goal RD appointment deferred at this time      Intervention Plan   Intervention Prescribe, educate and counsel regarding individualized specific dietary modifications aiming towards targeted core components such as weight, hypertension, lipid management, diabetes, heart failure and other comorbidities.    Expected Outcomes Short Term Goal: Understand basic principles of dietary content, such as calories, fat, sodium, cholesterol and nutrients.          Nutrition Assessments:  MEDIFICTS Score Key: >=70 Need to make dietary changes  40-70 Heart Healthy Diet <= 40 Therapeutic Level Cholesterol Diet  Flowsheet Row Pulmonary Rehab from 01/30/2024 in [facility] Cardiac and Pulmonary Rehab  Picture Your Plate Total Score on Admission 53   Picture Your Plate Scores: <59 Unhealthy dietary pattern with much room for improvement. 41-50 Dietary pattern unlikely to meet recommendations for good health and room for improvement. 51-60 More healthful dietary pattern, with some room for improvement.  >60 Healthy dietary pattern, although there may be some specific behaviors that could be improved.   Nutrition Goals Re-Evaluation:  Nutrition Goals Re-Evaluation     Row Name 03/26/24 0852             Goals   Nutrition Goal RD appointment deferred at this time          Nutrition Goals Discharge (Final Nutrition Goals Re-Evaluation):  Nutrition Goals Re-Evaluation - 03/26/24 0852       Goals   Nutrition Goal RD appointment deferred at this time          Psychosocial: Target Goals: Acknowledge presence or absence of significant depression and/or stress, maximize coping skills, provide positive support system. Participant is able to verbalize types and ability to use techniques and skills needed for reducing stress and depression.   Education: Stress, Anxiety, and Depression - Group verbal and visual presentation to define topics covered.  Reviews  how body is impacted by stress, anxiety, and depression.  Also discusses healthy ways to reduce stress and to treat/manage anxiety and depression.  Written material provided at class time.   Education: Sleep Hygiene -Provides group verbal and written instruction about how sleep can affect your health.  Define sleep hygiene, discuss sleep cycles and impact of sleep habits. Review good sleep hygiene tips.    Initial Review & Psychosocial Screening:  Initial Psych Review & Screening - 01/23/24 1046       Initial Review   Current issues with None Identified      Family Dynamics   Good Support System? Yes      Barriers   Psychosocial barriers to participate in program There are no identifiable barriers or psychosocial needs.;The patient should benefit from training in stress management and relaxation.      Screening Interventions   Interventions Encouraged to exercise;To provide support and resources with identified psychosocial needs    Expected Outcomes Short Term goal: Utilizing psychosocial counselor, staff and physician to assist with identification of specific Stressors or current issues interfering with healing process. Setting desired goal for each stressor or current issue identified.;Long Term Goal: Stressors or current issues are controlled  or eliminated.;Short Term goal: Identification and review with participant of any Quality of Life or Depression concerns found by scoring the questionnaire.;Long Term goal: The participant improves quality of Life and PHQ9 Scores as seen by post scores and/or verbalization of changes          Quality of Life Scores:  Scores of 19 and below usually indicate a poorer quality of life in these areas.  A difference of  2-3 points is a clinically meaningful difference.  A difference of 2-3 points in the total score of the Quality of Life Index has been associated with significant improvement in overall quality of life, self-image, physical symptoms,  and general health in studies assessing change in quality of life.  PHQ-9: Review Flowsheet  More data exists      03/26/2024 01/30/2024 10/27/2021 05/30/2021 08/03/2020  Depression screen PHQ 2/9  Decreased Interest 0 1 1 0 1  Down, Depressed, Hopeless 1 0 1 1 1   PHQ - 2 Score 1 1 2 1 2   Altered sleeping 0 1 1 0 1  Tired, decreased energy 1 1 1 2 2   Change in appetite 0 1 0 0 1  Feeling bad or failure about yourself  1 0 0 0 0  Trouble concentrating 1 1 1  0 0  Moving slowly or fidgety/restless 1 1 0 0 0  Suicidal thoughts 0 0 0 0 0  PHQ-9 Score 5 6 5 3 6   Difficult doing work/chores Not difficult at all Somewhat difficult Somewhat difficult Not difficult at all Not difficult at all   Interpretation of Total Score  Total Score Depression Severity:  1-4 = Minimal depression, 5-9 = Mild depression, 10-14 = Moderate depression, 15-19 = Moderately severe depression, 20-27 = Severe depression   Psychosocial Evaluation and Intervention:  Psychosocial Evaluation - 01/23/24 1058       Psychosocial Evaluation & Interventions   Comments Erin Middleton is coming to the program with systolic heart failure. She notes her shortness of breath has been her main issue, but can tell a difference with her medication now. She has issue with  her vision, so she no longer drives and relies on her daughter for transportation. When asked about stress concerns, she mentions currently she feels like she is doing okay. She is looking forward to attending the program to work on her stamina and strength    Expected Outcomes Short: attend the program for education and exercise Long: develop and maintain positive self care habits.    Continue Psychosocial Services  Follow up required by staff          Psychosocial Re-Evaluation:  Psychosocial Re-Evaluation     Row Name 03/26/24 [phone]             Psychosocial Re-Evaluation   Current issues with None Identified       Comments Erin Middleton denies any stress, depression, or  anxiety at this time. She is sleeping good with an average of 6-7 hours a night, which is normal for her. She has a good support system. Her PHQ was reasses to a score of 5 today.       Expected Outcomes Short: Attend rehab for exercise benefits on stress management. Long: Continue a positive outlook on life.       Interventions Encouraged to attend Cardiac Rehabilitation for the exercise       Continue Psychosocial Services  Follow up required by staff          Psychosocial Discharge (Final Psychosocial Re-Evaluation):  Psychosocial Re-Evaluation - 03/26/24 0850       Psychosocial Re-Evaluation   Current issues with None Identified    Comments Erin Middleton denies any stress, depression, or anxiety at this time. She is sleeping good with an average of 6-7 hours a night, which is normal for her. She has a good support system. Her PHQ was reasses to a score of 5 today.    Expected Outcomes Short: Attend rehab for exercise benefits on stress management. Long: Continue a positive outlook on life.    Interventions Encouraged to attend Cardiac Rehabilitation for the exercise    Continue Psychosocial Services  Follow up required by staff          Education: Education Goals: Education classes will be provided on a weekly basis, covering required topics. Participant will state understanding/return demonstration of topics presented.  Learning Barriers/Preferences:  Learning Barriers/Preferences - 01/23/24 1046       Learning Barriers/Preferences   Learning Barriers Sight    Learning Preferences None          General Pulmonary Education Topics:  Infection Prevention: - Provides verbal and written material to individual with discussion of infection control including proper hand washing and proper equipment cleaning during exercise session. Flowsheet Row Pulmonary Rehab from 01/30/2024 in [facility] Cardiac and Pulmonary Rehab  Date 01/30/24  Educator MB  Instruction Review Code 1- Verbalizes  Understanding    Falls Prevention: - Provides verbal and written material to individual with discussion of falls prevention and safety. Flowsheet Row Pulmonary Rehab from 01/30/2024 in [facility] Cardiac and Pulmonary Rehab  Date 01/30/24  Educator MB  Instruction Review Code 1- Verbalizes Understanding    Chronic Lung Disease Review: - Group verbal instruction with posters, models, PowerPoint presentations and videos,  to review new updates, new respiratory medications, new advancements in procedures and treatments. Providing information on websites and 800 numbers for continued self-education. Includes information about supplement oxygen, available portable oxygen systems, continuous and intermittent flow rates, oxygen safety, concentrators, and Medicare reimbursement for oxygen. Explanation of Pulmonary Drugs, including class, frequency, complications, importance of spacers, rinsing mouth after steroid MDI's, and proper cleaning methods for nebulizers. Review of basic lung anatomy and physiology related to function, structure, and complications of lung disease. Review of risk factors. Discussion about methods for diagnosing sleep apnea and types of masks and machines for OSA. Includes a review of the use of types of environmental controls: home humidity, furnaces, filters, dust mite/pet prevention, HEPA vacuums. Discussion about weather changes, air quality and the benefits of nasal washing. Instruction on Warning signs, infection symptoms, calling MD promptly, preventive modes, and value of vaccinations. Review of effective airway clearance, coughing and/or vibration techniques. Emphasizing that all should Create an Action Plan. Written material provided at class time.   AED/CPR: - Group verbal and written instruction with the use of models to demonstrate the basic use of the AED with the basic ABC's of resuscitation.    Tests and Procedures:  - Group verbal and visual presentation and models  provide information about basic cardiac anatomy and function. Reviews the testing methods done to diagnose heart disease and the outcomes of the test results. Describes the treatment choices: Medical Management, Angioplasty, or Coronary Bypass Surgery for treating various heart conditions including Myocardial Infarction, Angina, Valve Disease, and Cardiac Arrhythmias.  Written material provided at class time.   Medication Safety: - Group verbal and visual instruction to review commonly prescribed medications for heart and lung disease. Reviews the medication, class of the  drug, and side effects. Includes the steps to properly store meds and maintain the prescription regimen.  Written material given at graduation.   Other: -Provides group and verbal instruction on various topics (see comments)   Knowledge Questionnaire Score:  Knowledge Questionnaire Score - 01/30/24 1535       Knowledge Questionnaire Score   Pre Score 25/26           Core Components/Risk Factors/Patient Goals at Admission:  Personal Goals and Risk Factors at Admission - 01/30/24 1536       Core Components/Risk Factors/Patient Goals on Admission    Weight Management Yes;Weight Maintenance    Intervention Weight Management: Develop a combined nutrition and exercise program designed to reach desired caloric intake, while maintaining appropriate intake of nutrient and fiber, sodium and fats, and appropriate energy expenditure required for the weight goal.;Weight Management: Provide education and appropriate resources to help participant work on and attain dietary goals.;Weight Management/Obesity: Establish reasonable short term and long term weight goals.    Admit Weight 138 lb (62.6 kg)    Goal Weight: Short Term 138 lb (62.6 kg)    Goal Weight: Long Term 138 lb (62.6 kg)    Expected Outcomes Weight Maintenance: Understanding of the daily nutrition guidelines, which includes 25-35% calories from fat, 7% or less cal from  saturated fats, less than 200mg  cholesterol, less than 1.5gm of sodium, & 5 or more servings of fruits and vegetables daily;Understanding recommendations for meals to include 15-35% energy as protein, 25-35% energy from fat, 35-60% energy from carbohydrates, less than 200mg  of dietary cholesterol, 20-35 gm of total fiber daily;Understanding of distribution of calorie intake throughout the day with the consumption of 4-5 meals/snacks;Short Term: Continue to assess and modify interventions until short term weight is achieved;Long Term: Adherence to nutrition and physical activity/exercise program aimed toward attainment of established weight goal    Diabetes Yes    Intervention Provide education about signs/symptoms and action to take for hypo/hyperglycemia.;Provide education about proper nutrition, including hydration, and aerobic/resistive exercise prescription along with prescribed medications to achieve blood glucose in normal ranges: Fasting glucose 65-99 mg/dL    Expected Outcomes Short Term: Participant verbalizes understanding of the signs/symptoms and immediate care of hyper/hypoglycemia, proper foot care and importance of medication, aerobic/resistive exercise and nutrition plan for blood glucose control.;Long Term: Attainment of HbA1C < 7%.    Heart Failure Yes    Intervention Provide a combined exercise and nutrition program that is supplemented with education, support and counseling about heart failure. Directed toward relieving symptoms such as shortness of breath, decreased exercise tolerance, and extremity edema.    Expected Outcomes Improve functional capacity of life;Short term: Attendance in program 2-3 days a week with increased exercise capacity. Reported lower sodium intake. Reported increased fruit and vegetable intake. Reports medication compliance.;Short term: Daily weights obtained and reported for increase. Utilizing diuretic protocols set by physician.;Long term: Adoption of self-care  skills and reduction of barriers for early signs and symptoms recognition and intervention leading to self-care maintenance.    Hypertension Yes    Intervention Provide education on lifestyle modifcations including regular physical activity/exercise, weight management, moderate sodium restriction and increased consumption of fresh fruit, vegetables, and low fat dairy, alcohol moderation, and smoking cessation.;Monitor prescription use compliance.    Expected Outcomes Short Term: Continued assessment and intervention until BP is < 140/51mm HG in hypertensive participants. < 130/58mm HG in hypertensive participants with diabetes, heart failure or chronic kidney disease.;Long Term: Maintenance of blood pressure at goal  levels.    Lipids Yes    Intervention Provide education and support for participant on nutrition & aerobic/resistive exercise along with prescribed medications to achieve LDL 70mg , HDL >40mg .    Expected Outcomes Short Term: Participant states understanding of desired cholesterol values and is compliant with medications prescribed. Participant is following exercise prescription and nutrition guidelines.;Long Term: Cholesterol controlled with medications as prescribed, with individualized exercise RX and with personalized nutrition plan. Value goals: LDL < 70mg , HDL > 40 mg.          Education:Diabetes - Individual verbal and written instruction to review signs/symptoms of diabetes, desired ranges of glucose level fasting, after meals and with exercise. Acknowledge that pre and post exercise glucose checks will be done for 3 sessions at entry of program. Flowsheet Row Pulmonary Rehab from 01/30/2024 in [facility] Cardiac and Pulmonary Rehab  Date 01/30/24  Educator MB  Instruction Review Code 1- Verbalizes Understanding    Know Your Numbers and Heart Failure: - Group verbal and visual instruction to discuss disease risk factors for cardiac and pulmonary disease and treatment options.   Reviews associated critical values for Overweight/Obesity, Hypertension, Cholesterol, and Diabetes.  Discusses basics of heart failure: signs/symptoms and treatments.  Introduces Heart Failure Zone chart for action plan for heart failure. Written material provided at class time.   Core Components/Risk Factors/Patient Goals Review:   Goals and Risk Factor Review     Row Name 03/26/24 [phone]             Core Components/Risk Factors/Patient Goals Review   Personal Goals Review Weight Management/Obesity;Hypertension;Diabetes;Heart Failure       Review Erin Middleton weighed 136.8 lbs today and she feels like she is doing good with maintaining her weight. She checks her weight at home and monitors due to her heart failure. She checks her blood pressure a couple times a week and it has been normal. She checks her blood sugar regularly with a monitor and it has been really good.       Expected Outcomes Short: Continue monitoring weight, blood pressure, and blood sugar levels. Long: Continue to manage cardiovascular risk factors          Core Components/Risk Factors/Patient Goals at Discharge (Final Review):   Goals and Risk Factor Review - 03/26/24 0852       Core Components/Risk Factors/Patient Goals Review   Personal Goals Review Weight Management/Obesity;Hypertension;Diabetes;Heart Failure    Review Erin Middleton weighed 136.8 lbs today and she feels like she is doing good with maintaining her weight. She checks her weight at home and monitors due to her heart failure. She checks her blood pressure a couple times a week and it has been normal. She checks her blood sugar regularly with a monitor and it has been really good.    Expected Outcomes Short: Continue monitoring weight, blood pressure, and blood sugar levels. Long: Continue to manage cardiovascular risk factors          ITP Comments:  ITP Comments     Row Name 01/23/24 1058 01/30/24 1524 02/06/24 0807 02/27/24 0957 03/26/24 1446   ITP Comments  Initial phone call completed. Diagnosis can be found in [facility] 7/9. EP Orientation scheduled for Wednesday 7/30 at 1:30p. Completed and gym orientation for respiratory care services. Initial ITP created and sent for review to Dr. Faud Aleskerov, Medical Director. First full day of exercise!  Patient was oriented to gym and equipment including functions, settings, policies, and procedures.  Patient's individual exercise prescription and treatment plan  were reviewed.  All starting workloads were established based on the results of the 6 minute walk test done at initial orientation visit.  The plan for exercise progression was also introduced and progression will be customized based on patient's performance and goals. 30 Day review completed. Medical Director ITP review done; changes made as directed and signed approval by Medical Director.New to Program. 30 Day review completed. Medical Director ITP review done; changes made as directed and signed approval by Medical Director.      Comments: 30 day review

## 2024-03-31 ENCOUNTER — Encounter

## 2024-03-31 DIAGNOSIS — I5022 Chronic systolic (congestive) heart failure: Secondary | ICD-10-CM

## 2024-03-31 NOTE — Progress Notes (Signed)
 Daily Session Note  Patient Details  Name: KERYN NESSLER MRN: [id number] Date of Birth: 05/16/1946 Referring Provider:   Conrad Ports Pulmonary Rehab from 01/30/2024 in [facility] Cardiac and Pulmonary Rehab  Referring Provider Florencio Kava, MD    Encounter Date: 03/31/2024  Check In:  Session Check In - 03/31/24 0745       Check-In   Supervising physician immediately available to respond to emergencies See telemetry face sheet for immediately available ER MD    Location [facility]-Cardiac & Pulmonary Rehab    Staff Present Burnard Davenport [facility] Peggi, RN, DNP, NE-BC;Joseph [facility] BS, ACSM CEP, Exercise Physiologist;Jason Elnor RDN,LDN    Virtual Visit No    Medication changes reported     No    Fall or balance concerns reported    No    Warm-up and Cool-down Performed on first and last piece of equipment    Resistance Training Performed Yes    VAD Patient? No    PAD/SET Patient? No      Pain Assessment   Currently in Pain? No/denies             Social History   Tobacco Use  Smoking Status Never  Smokeless Tobacco Never    Goals Met:  Proper associated with RPD/PD & O2 Sat Independence with exercise equipment Using PLB without cueing & demonstrates good technique Exercise tolerated well No report of concerns or symptoms today Strength training completed today  Goals Unmet:  Not Applicable  Comments: Pt able to follow exercise prescription today without complaint.  Will continue to monitor for progression.    Dr. Oneil Pinal is Medical Director for [facility] Cardiac Rehabilitation.  Dr. Fuad Aleskerov is Medical Director for [facility] Pulmonary Rehabilitation.

## 2024-04-02 ENCOUNTER — Encounter: Attending: Internal Medicine

## 2024-04-02 DIAGNOSIS — I5022 Chronic systolic (congestive) heart failure: Secondary | ICD-10-CM | POA: Diagnosis present

## 2024-04-02 NOTE — Progress Notes (Signed)
 Daily Session Note  Patient Details  Name: Erin Middleton MRN: [id number] Date of Birth: 06/21/1946 Referring Provider:   Conrad Ports Pulmonary Rehab from 01/30/2024 in [facility] Cardiac and Pulmonary Rehab  Referring Provider Florencio Kava, MD    Encounter Date: 04/02/2024  Check In:  Session Check In - 04/02/24 0737       Check-In   Supervising physician immediately available to respond to emergencies See telemetry face sheet for immediately available ER MD    Location [facility]-Cardiac & Pulmonary Rehab    Staff Present Burnard Davenport [facility] Peggi, RN, DNP, NE-BC;Joseph [facility] Best, MS, Exercise Physiologist;Jason Elnor RDN,LDN    Virtual Visit No    Medication changes reported     No    Fall or balance concerns reported    No    Warm-up and Cool-down Performed on first and last piece of equipment    Resistance Training Performed Yes    VAD Patient? No    PAD/SET Patient? No      Pain Assessment   Currently in Pain? No/denies             Social History   Tobacco Use  Smoking Status Never  Smokeless Tobacco Never    Goals Met:  Proper associated with RPD/PD & O2 Sat Independence with exercise equipment Using PLB without cueing & demonstrates good technique Exercise tolerated well No report of concerns or symptoms today Strength training completed today  Goals Unmet:  Not Applicable  Comments: Pt able to follow exercise prescription today without complaint.  Will continue to monitor for progression.    Dr. Oneil Pinal is Medical Director for [facility] Cardiac Rehabilitation.  Dr. Fuad Aleskerov is Medical Director for [facility] Pulmonary Rehabilitation.

## 2024-04-07 ENCOUNTER — Encounter

## 2024-04-07 DIAGNOSIS — I5022 Chronic systolic (congestive) heart failure: Secondary | ICD-10-CM

## 2024-04-07 NOTE — Progress Notes (Signed)
 Daily Session Note  Patient Details  Name: Erin Middleton MRN: [id number] Date of Birth: 07/01/1946 Referring Provider:   [facility] from 01/30/2024 in [facility] Cardiac and Pulmonary Rehab  Referring Provider Florencio Kava, MD    Encounter Date: 04/07/2024  Check In:  Session Check In - 04/07/24 0731       Check-In   Supervising physician immediately available to respond to emergencies See telemetry face sheet for immediately available ER MD    Location [facility]-Cardiac & Pulmonary Rehab    Staff Present Burnard Davenport RN,BSN,MPA;Joseph [facility] Dyane BS, ACSM CEP, Exercise Physiologist;Jason Elnor RDN,LDN    Virtual Visit No    Medication changes reported     No    Fall or balance concerns reported    No    Warm-up and Cool-down Performed on first and last piece of equipment    Resistance Training Performed Yes    VAD Patient? No    PAD/SET Patient? No      Pain Assessment   Currently in Pain? No/denies             Social History   Tobacco Use  Smoking Status Never  Smokeless Tobacco Never    Goals Met:  Proper associated with RPD/PD & O2 Sat Independence with exercise equipment Using PLB without cueing & demonstrates good technique Exercise tolerated well No report of concerns or symptoms today Strength training completed today  Goals Unmet:  Not Applicable  Comments: Pt able to follow exercise prescription today without complaint.  Will continue to monitor for progression.    Dr. Oneil Pinal is Medical Director for [facility] Cardiac Rehabilitation.  Dr. Fuad Aleskerov is Medical Director for [facility] Pulmonary Rehabilitation.

## 2024-04-09 ENCOUNTER — Encounter

## 2024-04-09 DIAGNOSIS — I5022 Chronic systolic (congestive) heart failure: Secondary | ICD-10-CM | POA: Diagnosis not present

## 2024-04-09 NOTE — Progress Notes (Signed)
 Daily Session Note  Patient Details  Name: Erin Middleton MRN: [id number] Date of Birth: 04-19-1946 Referring Provider:   [facility] from 01/30/2024 in [facility] Cardiac and Pulmonary Rehab  Referring Provider Florencio Kava, MD    Encounter Date: 04/09/2024  Check In:  Session Check In - 04/09/24 0727       Check-In   Supervising physician immediately available to respond to emergencies See telemetry face sheet for immediately available ER MD    Location [facility]-Cardiac & Pulmonary Rehab    Staff Present Burnard Davenport RN,BSN,MPA;Joseph Hood RCP,RRT,BSRT;Maxon Burnell BS, Exercise Physiologist;Margaret Best, MS, Exercise Physiologist    Virtual Visit No    Medication changes reported     No    Fall or balance concerns reported    No    Warm-up and Cool-down Performed on first and last piece of equipment    Resistance Training Performed Yes    VAD Patient? No    PAD/SET Patient? No      Pain Assessment   Currently in Pain? No/denies             Social History   Tobacco Use  Smoking Status Never  Smokeless Tobacco Never    Goals Met:  Proper associated with RPD/PD & O2 Sat Independence with exercise equipment Using PLB without cueing & demonstrates good technique Exercise tolerated well No report of concerns or symptoms today Strength training completed today  Goals Unmet:  Not Applicable  Comments: Pt able to follow exercise prescription today without complaint.  Will continue to monitor for progression.    Dr. Oneil Pinal is Medical Director for [facility] Cardiac Rehabilitation.  Dr. Fuad Aleskerov is Medical Director for [facility] Pulmonary Rehabilitation.

## 2024-04-14 ENCOUNTER — Encounter

## 2024-04-14 DIAGNOSIS — I5022 Chronic systolic (congestive) heart failure: Secondary | ICD-10-CM

## 2024-04-14 NOTE — Progress Notes (Signed)
 Daily Session Note  Patient Details  Name: Erin Middleton MRN: [id number] Date of Birth: 12-17-1945 Referring Provider:   [facility] from 01/30/2024 in [facility] Cardiac and Pulmonary Rehab  Referring Provider Florencio Kava, MD    Encounter Date: 04/14/2024  Check In:  Session Check In - 04/14/24 0740       Check-In   Supervising physician immediately available to respond to emergencies See telemetry face sheet for immediately available ER MD    Location [facility]-Cardiac & Pulmonary Rehab    Staff Present Burnard Davenport RN,BSN,MPA;Joseph [facility] RCP,RRT,BSRT;Dayle Sherpa Dyane BS, ACSM CEP, Exercise Physiologist    Virtual Visit No    Medication changes reported     No    Fall or balance concerns reported    No    Warm-up and Cool-down Performed on first and last piece of equipment    Resistance Training Performed Yes    VAD Patient? No    PAD/SET Patient? No      Pain Assessment   Currently in Pain? No/denies             Social History   Tobacco Use  Smoking Status Never  Smokeless Tobacco Never    Goals Met:  Proper associated with RPD/PD & O2 Sat Independence with exercise equipment Using PLB without cueing & demonstrates good technique Exercise tolerated well No report of concerns or symptoms today Strength training completed today  Goals Unmet:  Not Applicable  Comments: Pt able to follow exercise prescription today without complaint.  Will continue to monitor for progression.    Dr. Oneil Pinal is Medical Director for [facility] Cardiac Rehabilitation.  Dr. Fuad Aleskerov is Medical Director for [facility] Pulmonary Rehabilitation.

## 2024-04-16 ENCOUNTER — Encounter

## 2024-04-16 DIAGNOSIS — I5022 Chronic systolic (congestive) heart failure: Secondary | ICD-10-CM | POA: Diagnosis not present

## 2024-04-16 NOTE — Progress Notes (Signed)
 Daily Session Note  Patient Details  Name: Erin Middleton MRN: [id number] Date of Birth: [birth year] Referring Provider:   [facility]rad [facility] from 01/30/2024 in [facility] Cardiac and Pulmonary Rehab  Referring Provider Florencio Kava, MD    Encounter Date: 04/16/2024  Check In:  Session Check In - 04/16/24 0733       Check-In   Supervising physician immediately available to respond to emergencies See telemetry face sheet for immediately available ER MD    Location [facility]-Cardiac & Pulmonary Rehab    Staff Present Burnard Davenport RN,BSN,MPA;Joseph [facility] RCP,RRT,BSRT;Margaret Best, MS, Exercise Physiologist;Jason Elnor [facility]    Virtual Visit No    Medication changes reported     No    Fall or balance concerns reported    No    Warm-up and Cool-down Performed on first and last piece of equipment    Resistance Training Performed Yes    VAD Patient? No    PAD/SET Patient? No      Pain Assessment   Currently in Pain? No/denies             Social History   Tobacco Use  Smoking Status Never  Smokeless Tobacco Never    Goals Met:  Proper associated with RPD/PD & O2 Sat Independence with exercise equipment Using PLB without cueing & demonstrates good technique Exercise tolerated well No report of concerns or symptoms today Strength training completed today  Goals Unmet:  Not Applicable  Comments: Pt able to follow exercise prescription today without complaint.  Will continue to monitor for progression.    Dr. Oneil Pinal is Medical Director for [facility] Cardiac Rehabilitation.  Dr. Fuad Aleskerov is Medical Director for [facility] Pulmonary Rehabilitation.

## 2024-04-21 ENCOUNTER — Encounter

## 2024-04-21 DIAGNOSIS — I5022 Chronic systolic (congestive) heart failure: Secondary | ICD-10-CM

## 2024-04-21 NOTE — Progress Notes (Addendum)
 Daily Session Note  Patient Details  Name: BLAKELEE ALLINGTON MRN: [id number] Date of Birth: 1946/07/01 Referring Provider:   [facility] from 01/30/2024 in [facility] Cardiac and Pulmonary Rehab  Referring Provider Florencio Kava, MD    Encounter Date: 04/21/2024  Check In:  Session Check In - 04/21/24 0736       Check-In   Supervising physician immediately available to respond to emergencies See telemetry face sheet for immediately available ER MD    Location [facility]-Cardiac & Pulmonary Rehab    Staff Present Burnard Davenport [facility] Dyane BS, ACSM CEP, Exercise Physiologist;Joseph Rolinda RCP,RRT,BSRT;Jason Elnor RDN,LDN    Virtual Visit No    Medication changes reported     Yes    Comments added Jardiance    Fall or balance concerns reported    No    Warm-up and Cool-down Performed on first and last piece of equipment    Resistance Training Performed Yes    VAD Patient? No    PAD/SET Patient? No      Pain Assessment   Currently in Pain? No/denies             Social History   Tobacco Use  Smoking Status Never  Smokeless Tobacco Never    Goals Met:  Proper associated with RPD/PD & O2 Sat Independence with exercise equipment Using PLB without cueing & demonstrates good technique Exercise tolerated well No report of concerns or symptoms today Strength training completed today  Goals Unmet:  Not Applicable  Comments: Pt able to follow exercise prescription today without complaint.  Will continue to monitor for progression.    Dr. Oneil Pinal is Medical Director for [facility] Cardiac Rehabilitation.  Dr. Fuad Aleskerov is Medical Director for [facility] Pulmonary Rehabilitation.

## 2024-04-23 ENCOUNTER — Encounter

## 2024-04-23 DIAGNOSIS — I5022 Chronic systolic (congestive) heart failure: Secondary | ICD-10-CM

## 2024-04-23 NOTE — Progress Notes (Signed)
 Daily Session Note  Patient Details  Name: Erin Middleton MRN: [id number] Date of Birth: June 09, 1946 Referring Provider:   [facility]rad [facility]s [facility] from 01/30/2024 in [facility] Cardiac and Pulmonary Rehab  Referring Provider Florencio Kava, MD    Encounter Date: 04/23/2024  Check In:  Session Check In - 04/23/24 0733       Check-In   Supervising physician immediately available to respond to emergencies See telemetry face sheet for immediately available ER MD    Location [facility]-Cardiac & Pulmonary Rehab    Staff Present Burnard Davenport RN,BSN,MPA;Joseph Rolinda RCP,RRT,BSRT;Noah Tickle, [place], Exercise Physiologist    Virtual Visit No    Medication changes reported     No    Fall or balance concerns reported    No    Warm-up and Cool-down Performed on first and last piece of equipment    Resistance Training Performed Yes    VAD Patient? No    PAD/SET Patient? No      Pain Assessment   Currently in Pain? No/denies             Social History   Tobacco Use  Smoking Status Never  Smokeless Tobacco Never    Goals Met:  Proper associated with RPD/PD & O2 Sat Independence with exercise equipment Using PLB without cueing & demonstrates good technique Exercise tolerated well No report of concerns or symptoms today Strength training completed today  Goals Unmet:  Not Applicable  Comments: Pt able to follow exercise prescription today without complaint.  Will continue to monitor for progression.    Dr. Oneil Pinal is Medical Director for [facility] Cardiac Rehabilitation.  Dr. Fuad Aleskerov is Medical Director for [facility] Pulmonary Rehabilitation.

## 2024-04-23 NOTE — Progress Notes (Signed)
 Cardiac Individual Treatment Plan  Patient Details  Name: Erin Middleton MRN: [id number] Date of Birth: 1946-03-04 Referring Provider:   [facility] from 01/30/2024 in [facility] Cardiac and Pulmonary Rehab  Referring Provider Florencio Kava, MD    Initial Encounter Date:  Flowsheet Row Pulmonary Rehab from 01/30/2024 in [facility] Cardiac and Pulmonary Rehab  Date 01/30/24    Visit Diagnosis: Heart failure, chronic systolic (HCC)  Patient's Home Medications on Admission:  Current Outpatient Medications:    ACCU-CHEK GUIDE test strip, CHECK BLOOD SUGAR EVERY DAY, Disp: 100 strip, Rfl: 2   Accu-Chek Softclix Lancets lancets, CHECK BLOOD SUGAR ONCE DAILY, Disp: 100 each, Rfl: 12   amLODipine  (NORVASC ) 5 MG tablet, TAKE 1 TABLET BY MOUTH EVERY DAY IN THE EVENING (Patient not taking: Reported on 02/19/2024), Disp: 90 tablet, Rfl: 2   blood glucose meter kit and supplies, Dispense based on patient and insurance preference. Check blood sugar once a day (FOR ICD-10 E10.9, E11.9)., Disp: 1 each, Rfl: 0   Cholecalciferol  10 MCG (400 UNIT) CAPS, , Disp: , Rfl:    furosemide (LASIX) 40 MG tablet, Take 40 mg by mouth. (Patient taking differently: Take 20 mg by mouth daily.), Disp: , Rfl:    metFORMIN  (GLUCOPHAGE ) 500 MG tablet, Take 1 tablet (500 mg total) by mouth 2 (two) times daily with a meal., Disp: 180 tablet, Rfl: 1   Multiple Vitamins-Minerals (PRESERVISION/LUTEIN ) CAPS, Take 1 capsule by mouth at bedtime., Disp: , Rfl:    omeprazole  (PRILOSEC) 20 MG capsule, Take 20 mg by mouth., Disp: , Rfl:    rosuvastatin  (CRESTOR ) 40 MG tablet, Take 1 tablet by mouth at bedtime., Disp: , Rfl:    spironolactone (ALDACTONE) 25 MG tablet, Take 25 mg by mouth daily. (Patient not taking: Reported on 02/19/2024), Disp: , Rfl:    telmisartan (MICARDIS) 80 MG tablet, TAKE 1 TABLET BY MOUTH EVERY DAY (Patient not taking: Reported on 02/19/2024), Disp: 90 tablet, Rfl: 3   vitamin E  400 UNIT capsule, Take  400 Units by mouth daily. 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 breast cancer - chemotherapy   Cancer (HCC)    Breast   Diabetes mellitus without complication (HCC)    GERD (gastroesophageal reflux disease)    Headache    migraines h/o   Hypertension    Macular degeneration of both eyes, unspecified type    pt states its pretty advanced.   Personal history of chemotherapy     Tobacco Use: Social History   Tobacco Use  Smoking Status Never  Smokeless Tobacco Never    Labs: Review Flowsheet  More data exists      Latest Ref Rng & Units 03/09/2020 08/03/2020 12/21/2020 05/30/2021 10/27/2021  Labs for ITP Cardiac and Pulmonary Rehab  Cholestrol 100 - 199 mg/dL - 879  - 887  -  LDL (calc) 0 - 99 mg/dL - 47  - 38  -  HDL-C >60 mg/dL - 57  - 60  -  Trlycerides 0 - 149 mg/dL - 81  - 67  -  Hemoglobin A1c 4.8 - 5.6 % 6.5  6.6  7.0  6.8  7.7      Exercise Target Goals: Exercise Program Goal: Individual exercise prescription set using results from initial 6 min walk test and THRR while considering  patient's activity barriers and safety.   Exercise Prescription  Goal: Initial exercise prescription builds to 30-45 minutes a day of aerobic activity, 2-3 days per week.  Home exercise guidelines will be given to patient during program as part of exercise prescription that the participant will acknowledge.   Education: Aerobic Exercise: - Group verbal and visual presentation on the components of exercise prescription. Introduces F.I.T.T principle from ACSM for exercise prescriptions.  Reviews F.I.T.T. principles of aerobic exercise including progression. Written material provided at class time.   Education: Resistance Exercise: - Group verbal and visual presentation on the components of exercise prescription. Introduces F.I.T.T principle from ACSM for exercise prescriptions   Reviews F.I.T.T. principles of resistance exercise including progression. Written material provided at class time.    Education: Exercise & Equipment Safety: - Individual verbal instruction and demonstration of equipment use and safety with use of the equipment. Flowsheet Row Pulmonary Rehab from 01/30/2024 in [facility] Cardiac and Pulmonary Rehab  Date 01/30/24  Educator MB  Instruction Review Code 1- Verbalizes Understanding    Education: Exercise Physiology & General Exercise Guidelines: - Group verbal and written instruction with models to review the exercise physiology of the cardiovascular system and associated critical values. Provides general exercise guidelines with specific guidelines to those with heart or lung disease. Written material provided at class time.   Education: Flexibility, Balance, Mind/Body Relaxation: - Group verbal and visual presentation with interactive activity on the components of exercise prescription. Introduces F.I.T.T principle from ACSM for exercise prescriptions. Reviews F.I.T.T. principles of flexibility and balance exercise training including progression. Also discusses the mind body connection.  Reviews various relaxation techniques to help reduce and manage stress (i.e. Deep breathing, progressive muscle relaxation, and visualization). Balance handout provided to take home. Written material provided at class time.   Activity Barriers & Risk Stratification:  Activity Barriers & Cardiac Risk Stratification - 01/30/24 1526       Activity Barriers & Cardiac Risk Stratification   Activity Barriers Back Problems;Shortness of Breath;Balance Concerns;Left Knee Replacement          6 Minute Walk:  6 Minute Walk     Row Name 01/30/24 1524         6 Minute Walk   Phase Initial     Distance 830 feet     Walk Time 6 minutes     # of Rest Breaks 0     MPH 1.57     METS 1.68     RPE 11     Perceived Dyspnea  1     VO2 Peak 5.89     Symptoms Yes  (comment)     Comments 6/10 back pain     Resting HR 81 bpm     Resting BP 128/60     Resting Oxygen Saturation  96 %     Exercise Oxygen Saturation  during 6 min walk 96 %     Max Ex. HR 97 bpm     Max Ex. BP 160/80     2 Minute Post BP 138/72       Interval HR   1 Minute HR 89     2 Minute HR 95     3 Minute HR 97     4 Minute HR 94     5 Minute HR 94     6 Minute HR 97     2 Minute Post HR 80     Interval Heart Rate? Yes       Interval Oxygen   Interval Oxygen? Yes  Baseline Oxygen Saturation % 96 %     1 Minute Oxygen Saturation % 97 %     1 Minute Liters of Oxygen 0 L     2 Minute Oxygen Saturation % 97 %     2 Minute Liters of Oxygen 0 L     3 Minute Oxygen Saturation % 98 %     3 Minute Liters of Oxygen 0 L     4 Minute Oxygen Saturation % 96 %     4 Minute Liters of Oxygen 0 L     5 Minute Oxygen Saturation % 96 %     5 Minute Liters of Oxygen 0 L     6 Minute Oxygen Saturation % 96 %     6 Minute Liters of Oxygen 0 L     2 Minute Post Oxygen Saturation % 97 %     2 Minute Post Liters of Oxygen 0 L        Oxygen Initial Assessment:  Oxygen Initial Assessment - 01/23/24 1108       Home Oxygen   Home Oxygen Device None    Sleep Oxygen Prescription None    Home Exercise Oxygen Prescription None    Home Resting Oxygen Prescription None    Compliance with Home Oxygen Use Yes      Intervention   Short Term Goals To learn and understand importance of maintaining oxygen saturations>88%;To learn and demonstrate proper pursed lip breathing techniques or other breathing techniques. ;To learn and demonstrate proper use of respiratory medications;To learn and understand importance of monitoring SPO2 with pulse oximeter and demonstrate accurate use of the pulse oximeter.    Long  Term Goals Verbalizes importance of monitoring SPO2 with pulse oximeter and return demonstration;Maintenance of O2 saturations>88%;Exhibits proper breathing techniques, such as pursed lip  breathing or other method taught during program session;Compliance with respiratory medication          Oxygen Re-Evaluation:  Oxygen Re-Evaluation     Row Name 02/06/24 0808 04/02/24 0747           Program Oxygen Prescription   Program Oxygen Prescription -- None        Home Oxygen   Home Oxygen Device None None      Sleep Oxygen Prescription None CPAP      Liters per minute -- 0      Home Exercise Oxygen Prescription None None      Home Resting Oxygen Prescription None None      Compliance with Home Oxygen Use Yes Yes        Goals/Expected Outcomes   Short Term Goals To learn and understand importance of maintaining oxygen saturations>88%;To learn and demonstrate proper pursed lip breathing techniques or other breathing techniques. ;To learn and demonstrate proper use of respiratory medications;To learn and understand importance of monitoring SPO2 with pulse oximeter and demonstrate accurate use of the pulse oximeter. To learn and demonstrate proper pursed lip breathing techniques or other breathing techniques.       Long  Term Goals Verbalizes importance of monitoring SPO2 with pulse oximeter and return demonstration;Maintenance of O2 saturations>88%;Exhibits proper breathing techniques, such as pursed lip breathing or other method taught during program session;Compliance with respiratory medication Exhibits proper breathing techniques, such as pursed lip breathing or other method taught during program session      Comments Reviewed PLB technique with pt.  Talked about how it works and it's importance in maintaining their exercise saturations. Informed patient how to  perform the Pursed Lipped breathing technique. Told patient to Inhale through the nose and out the mouth with pursed lips to keep their airways open, help oxygenate them better, practice when at rest or doing strenuous activity. Patient Verbalizes understanding of technique and will work on and be reiterated during  [facility].      Goals/Expected Outcomes Short: Become more profiecient at using PLB. Long: Become independent at using PLB. Short: use PLB with exertion. Long: use PLB on exertion proficiently and independently.         Oxygen Discharge (Final Oxygen Re-Evaluation):  Oxygen Re-Evaluation - 04/02/24 0747       Program Oxygen Prescription   Program Oxygen Prescription None      Home Oxygen   Home Oxygen Device None    Sleep Oxygen Prescription CPAP    Liters per minute 0    Home Exercise Oxygen Prescription None    Home Resting Oxygen Prescription None    Compliance with Home Oxygen Use Yes      Goals/Expected Outcomes   Short Term Goals To learn and demonstrate proper pursed lip breathing techniques or other breathing techniques.     Long  Term Goals Exhibits proper breathing techniques, such as pursed lip breathing or other method taught during program session    Comments Informed patient how to perform the Pursed Lipped breathing technique. Told patient to Inhale through the nose and out the mouth with pursed lips to keep their airways open, help oxygenate them better, practice when at rest or doing strenuous activity. Patient Verbalizes understanding of technique and will work on and be reiterated during [facility].    Goals/Expected Outcomes Short: use PLB with exertion. Long: use PLB on exertion proficiently and independently.          Initial Exercise Prescription:  Initial Exercise Prescription - 01/30/24 1500       Date of Initial Exercise RX and Referring Provider   Date 01/30/24    Referring Provider Florencio Kava, MD      Oxygen   Maintain Oxygen Saturation 88% or higher      Recumbant Bike   Level 1    RPM 50    Watts 15    Minutes 15    METs 1.68      NuStep   Level 1    SPM 80    Minutes 15    METs 1.68      Biostep-RELP   Level 1    SPM 50    Minutes 15    METs 1.68      Track   Laps 20   Hallway   Minutes 15    METs 2.09       Prescription Details   Frequency (times per week) 2    Duration Progress to 30 minutes of continuous aerobic without signs/symptoms of physical distress      Intensity   THRR 40-80% of Max Heartrate 104-128    Ratings of Perceived Exertion 11-13    Perceived Dyspnea 0-4      Progression   Progression Continue to progress workloads to maintain intensity without signs/symptoms of physical distress.      Resistance Training   Training Prescription Yes    Weight 3 lb    Reps 10-15          Perform Capillary Blood Glucose checks as needed.  Exercise Prescription Changes:   Exercise Prescription Changes     Row Name 01/30/24 1500 02/22/24 0800 03/05/24 1500 03/10/24  0800 03/20/24 0800     Response to Exercise   Blood Pressure (Admit) 128/60 122/62 104/62 -- 118/64   Blood Pressure (Exercise) 160/80 142/68 140/82 -- 136/70   Blood Pressure (Exit) 138/72 126/62 104/62 -- 122/64   Heart Rate (Admit) 81 bpm 80 bpm 80 bpm -- 81 bpm   Heart Rate (Exercise) 97 bpm 83 bpm 84 bpm -- 82 bpm   Heart Rate (Exit) 80 bpm 80 bpm 80 bpm -- 82 bpm   Oxygen Saturation (Admit) 96 % 97 % 96 % -- 96 %   Oxygen Saturation (Exercise) 98 % 96 % 95 % -- 95 %   Oxygen Saturation (Exit) 97 % 97 % 98 % -- 97 %   Rating of Perceived Exertion (Exercise) 11 13 13  -- 14   Perceived Dyspnea (Exercise) 1 1 2  -- 1   Symptoms 6/10 back pain -- none -- none   Comments results First three days of exercise -- -- --   Duration -- Progress to 30 minutes of  aerobic without signs/symptoms of physical distress Progress to 30 minutes of  aerobic without signs/symptoms of physical distress Progress to 30 minutes of  aerobic without signs/symptoms of physical distress Progress to 30 minutes of  aerobic without signs/symptoms of physical distress   Intensity -- THRR unchanged THRR unchanged THRR unchanged THRR unchanged     Progression   Progression -- Continue to progress workloads to maintain intensity without  signs/symptoms of physical distress. Continue to progress workloads to maintain intensity without signs/symptoms of physical distress. Continue to progress workloads to maintain intensity without signs/symptoms of physical distress. Continue to progress workloads to maintain intensity without signs/symptoms of physical distress.   Average METs 1.68 1.82 1.65 1.65 1.87     Resistance Training   Training Prescription -- Yes Yes Yes Yes   Weight -- 3 lb 3 lb 3 lb 3 lb   Reps -- 10-15 10-15 10-15 10-15     Interval Training   Interval Training -- No No No No     Recumbant Bike   Level -- 1 -- -- 2   Watts -- 15 -- -- 16   Minutes -- 15 -- -- 15   METs -- 2.76 -- -- 2.75     NuStep   Level -- 1 5 5 4    Minutes -- 15 30 30 30    METs -- 1.5 1.8 1.8 1.7     Biostep-RELP   Level -- 1 -- -- 1   Minutes -- 30 -- -- 15   METs -- 2 -- -- 2     Track   Laps -- 6 8 8 8    Minutes -- 15 15 15 15    METs -- 1.33 1.44 1.44 1.44     Home Exercise Plan   Plans to continue exercise at -- -- -- Home (comment)  walking Home (comment)  walking   Frequency -- -- -- Add 2 additional days to program exercise sessions. Add 2 additional days to program exercise sessions.   Initial Home Exercises Provided -- -- -- 03/10/24 03/10/24     Oxygen   Maintain Oxygen Saturation -- 88% or higher 88% or higher 88% or higher 88% or higher    Row Name 04/03/24 1700 04/17/24 1000           Response to Exercise   Blood Pressure (Admit) 138/76 126/58      Blood Pressure (Exit) 108/62 106/60  Heart Rate (Admit) 81 bpm 86 bpm      Heart Rate (Exercise) 84 bpm 80 bpm      Heart Rate (Exit) 80 bpm 81 bpm      Oxygen Saturation (Admit) 97 % 95 %      Oxygen Saturation (Exercise) 96 % 96 %      Oxygen Saturation (Exit) 98 % 98 %      Rating of Perceived Exertion (Exercise) 13 15      Perceived Dyspnea (Exercise) 1 1      Symptoms none none      Duration Progress to 30 minutes of  aerobic without  signs/symptoms of physical distress Progress to 30 minutes of  aerobic without signs/symptoms of physical distress      Intensity THRR unchanged THRR unchanged        Progression   Progression Continue to progress workloads to maintain intensity without signs/symptoms of physical distress. Continue to progress workloads to maintain intensity without signs/symptoms of physical distress.      Average METs 1.94 1.7        Resistance Training   Training Prescription Yes Yes      Weight 3 lb 3 lb      Reps 10-15 10-15        Interval Training   Interval Training No No        Recumbant Bike   Level 2 --      Watts 15 --      Minutes 15 --      METs 2.76 --        NuStep   Level 2 5      Minutes 30 15      METs 2 1.8        Biostep-RELP   Level 1 1      Minutes 30 15      METs 1 2        Track   Laps -- 8  Hall      Minutes -- 15      METs -- 1.55        Home Exercise Plan   Plans to continue exercise at Home (comment)  walking Home (comment)  walking      Frequency Add 2 additional days to program exercise sessions. Add 2 additional days to program exercise sessions.      Initial Home Exercises Provided 03/10/24 03/10/24        Oxygen   Maintain Oxygen Saturation 88% or higher 88% or higher         Exercise Comments:   Exercise Comments     Row Name 02/06/24 0807           Exercise Comments First full day of exercise!  Patient was oriented to gym and equipment including functions, settings, policies, and procedures.  Patient's individual exercise prescription and treatment plan were reviewed.  All starting workloads were established based on the results of the 6 minute walk test done at initial orientation visit.  The plan for exercise progression was also introduced and progression will be customized based on patient's performance and goals.          Exercise Goals and Review:   Exercise Goals     Row Name 01/30/24 1532             Exercise Goals    Increase Physical Activity Yes       Intervention Provide advice, education, support and counseling about physical activity/exercise  needs.;Develop an individualized exercise prescription for aerobic and resistive training based on initial evaluation findings, risk stratification, comorbidities and participant's personal goals.       Expected Outcomes Short Term: Attend rehab on a regular basis to increase amount of physical activity.;Long Term: Add in home exercise to make exercise part of routine and to increase amount of physical activity.;Long Term: Exercising regularly at least 3-5 days a week.       Increase Strength and Stamina Yes       Intervention Provide advice, education, support and counseling about physical activity/exercise needs.;Develop an individualized exercise prescription for aerobic and resistive training based on initial evaluation findings, risk stratification, comorbidities and participant's personal goals.       Expected Outcomes Short Term: Increase workloads from initial exercise prescription for resistance, speed, and METs.;Short Term: Perform resistance training exercises routinely during rehab and add in resistance training at home;Long Term: Improve cardiorespiratory fitness, muscular endurance and strength as measured by increased METs and functional capacity ( )       Able to understand and use rate of perceived exertion (RPE) scale Yes       Intervention Provide education and explanation on how to use RPE scale       Expected Outcomes Short Term: Able to use RPE daily in rehab to express subjective intensity level;Long Term:  Able to use RPE to guide intensity level when exercising independently       Able to understand and use Dyspnea scale Yes       Intervention Provide education and explanation on how to use Dyspnea scale       Expected Outcomes Short Term: Able to use Dyspnea scale daily in rehab to express subjective sense of shortness of breath during  exertion;Long Term: Able to use Dyspnea scale to guide intensity level when exercising independently       Knowledge and understanding of Target Heart Rate Range (THRR) Yes       Intervention Provide education and explanation of THRR including how the numbers were predicted and where they are located for reference       Expected Outcomes Short Term: Able to state/look up THRR;Short Term: Able to use daily as guideline for intensity in rehab;Long Term: Able to use THRR to govern intensity when exercising independently       Able to check pulse independently Yes       Intervention Provide education and demonstration on how to check pulse in carotid and radial arteries.;Review the importance of being able to check your own pulse for safety during independent exercise       Expected Outcomes Long Term: Able to check pulse independently and accurately;Short Term: Able to explain why pulse checking is important during independent exercise       Understanding of Exercise Prescription Yes       Intervention Provide education, explanation, and written materials on patient's individual exercise prescription       Expected Outcomes Short Term: Able to explain program exercise prescription;Long Term: Able to explain home exercise prescription to exercise independently          Exercise Goals Re-Evaluation :  Exercise Goals Re-Evaluation     Row Name 02/06/24 [phone] 02/22/24 0852 03/05/24 1546 03/10/24 0816 03/20/24 0856     Exercise Goal Re-Evaluation   Exercise Goals Review Increase Physical Activity;Able to understand and use rate of perceived exertion (RPE) scale;Knowledge and understanding of Target Heart Rate Range (THRR);Understanding of Exercise Prescription;Increase Strength and Stamina;Able to  understand and use Dyspnea scale;Able to check pulse independently Increase Physical Activity;Increase Strength and Stamina;Understanding of Exercise Prescription Increase Physical Activity;Increase Strength and  Stamina;Understanding of Exercise Prescription Increase Physical Activity;Able to understand and use rate of perceived exertion (RPE) scale;Knowledge and understanding of Target Heart Rate Range (THRR);Understanding of Exercise Prescription;Increase Strength and Stamina;Able to understand and use Dyspnea scale;Able to check pulse independently Increase Physical Activity;Understanding of Exercise Prescription;Increase Strength and Stamina   Comments Reviewed RPE and dyspnea scale, THR and program prescription with pt today.  Pt voiced understanding and was given a copy of goals to take home. Erin Middleton is off to a good start in rehab. She did well at level 1 on the T4 nustep, biostep, and the recumbent bike. She also did well walking six laps on the track. We will continue to monitor her progress in the program. Erin Middleton is off to a good start in rehab. She increased to level 5 on the T4 nustep. She increased her laps on the track to 8 laps. We will continue to monitor her progress in the program. Reviewed home exercise with pt today from 7:50 am to 8:00am.  Pt plans to walk for exercise.  Reviewed THR, pulse, RPE, sign and symptoms, pulse oximetery and when to call 911 or MD.  Also discussed weather considerations and indoor options.  Pt voiced understanding. Erin Middleton is doing well in rehab. She increased to level 2 on the recumbent bike. She maintained 8 laps on the track and level 1 on the biostep. She decreased down slightly to level 4 on the T4 nustep for 30 minutes. We will continue to monitor her progress in the program.   Expected Outcomes Short: Use RPE daily to regulate intensity. Long: Follow program prescription in THR. -- Short: Continue to progressively increase T4 nustep workload and track laps. Long: Continue exercise to improve strength and stamina. Short: add 1-2 days a week of exercise on off days of rehab. Long: maintain independent exercise routine upon graduation from program. Short: Try for more laps on the  track and increase back to level 5 on the T4 nustep. Long: Continue exercise to improve strength and stamina.    Row Name 04/03/24 1713 04/17/24 1041           Exercise Goal Re-Evaluation   Exercise Goals Review Increase Physical Activity;Understanding of Exercise Prescription;Increase Strength and Stamina Increase Physical Activity;Understanding of Exercise Prescription;Increase Strength and Stamina      Comments Erin Middleton is doing well in rehab. She maintained level 2 on the recumbent bike and level 1 on the biostep. She decreased her level to 2 on the T4 nustep, which was previously level 4. We will continue to monitor her progress in the program. Erin Middleton continues to do well in rehab. She was recently able to increase to level 5 on the T4 nustep. She was also able to maintain 8 laps in the hallway. We will continue to monitor her progress in the program.      Expected Outcomes Short: Increase back to level 4 on the T4 nustep. Long: Continue exercise to improve strength and stamina. Short: Continue to increase track laps, return to using the recumbent bike. Long: Continue exercise to improve strength and stamina.         Discharge Exercise Prescription (Final Exercise Prescription Changes):  Exercise Prescription Changes - 04/17/24 1000       Response to Exercise   Blood Pressure (Admit) 126/58    Blood Pressure (Exit) 106/60    Heart  Rate (Admit) 86 bpm    Heart Rate (Exercise) 80 bpm    Heart Rate (Exit) 81 bpm    Oxygen Saturation (Admit) 95 %    Oxygen Saturation (Exercise) 96 %    Oxygen Saturation (Exit) 98 %    Rating of Perceived Exertion (Exercise) 15    Perceived Dyspnea (Exercise) 1    Symptoms none    Duration Progress to 30 minutes of  aerobic without signs/symptoms of physical distress    Intensity THRR unchanged      Progression   Progression Continue to progress workloads to maintain intensity without signs/symptoms of physical distress.    Average METs 1.7       Resistance Training   Training Prescription Yes    Weight 3 lb    Reps 10-15      Interval Training   Interval Training No      NuStep   Level 5    Minutes 15    METs 1.8      Biostep-RELP   Level 1    Minutes 15    METs 2      Track   Laps 8   Hall   Minutes 15    METs 1.55      Home Exercise Plan   Plans to continue exercise at Home (comment)   walking   Frequency Add 2 additional days to program exercise sessions.    Initial Home Exercises Provided 03/10/24      Oxygen   Maintain Oxygen Saturation 88% or higher          Nutrition:  Target Goals: Understanding of nutrition guidelines, daily intake of sodium 1500mg , cholesterol 200mg , calories 30% from fat and 7% or less from saturated fats, daily to have 5 or more servings of fruits and vegetables.  Education: Nutrition 1 -Group instruction provided by verbal, written material, interactive activities, discussions, models, and posters to present general guidelines for heart healthy nutrition including macronutrients, label reading, and promoting whole foods over processed counterparts. Education serves as Pensions consultant of discussion of heart healthy eating for all. Written material provided at class time.    Education: Nutrition 2 -Group instruction provided by verbal, written material, interactive activities, discussions, models, and posters to present general guidelines for heart healthy nutrition including sodium, cholesterol, and saturated fat. Providing guidance of habit forming to improve blood pressure, cholesterol, and body weight. Written material provided at class time.     Biometrics:  Pre Biometrics - 01/30/24 1533       Pre Biometrics   Height 5' 1.3 (1.557 m)    Weight 138 lb (62.6 kg)    Waist Circumference 27.5 inches    Hip Circumference 42.8 inches    Waist to Hip Ratio 0.64 %    BMI (Calculated) 25.82    Single Leg Stand 4 seconds           Nutrition Therapy Plan and Nutrition  Goals:  Nutrition Therapy & Goals - 01/30/24 1535       Nutrition Therapy   RD appointment deferred Yes      Personal Nutrition Goals   Nutrition Goal RD appointment deferred at this time      Intervention Plan   Intervention Prescribe, educate and counsel regarding individualized specific dietary modifications aiming towards targeted core components such as weight, hypertension, lipid management, diabetes, heart failure and other comorbidities.    Expected Outcomes Short Term Goal: Understand basic principles of dietary content, such as calories, fat, sodium,  cholesterol and nutrients.          Nutrition Assessments:  MEDIFICTS Score Key: >=70 Need to make dietary changes  40-70 Heart Healthy Diet <= 40 Therapeutic Level Cholesterol Diet  Flowsheet Row Pulmonary Rehab from 01/30/2024 in [facility] Cardiac and Pulmonary Rehab  Picture Your Plate Total Score on Admission 53   Picture Your Plate Scores: <59 Unhealthy dietary pattern with much room for improvement. 41-50 Dietary pattern unlikely to meet recommendations for good health and room for improvement. 51-60 More healthful dietary pattern, with some room for improvement.  >60 Healthy dietary pattern, although there may be some specific behaviors that could be improved.    Nutrition Goals Re-Evaluation:  Nutrition Goals Re-Evaluation     Row Name 03/26/24 9147 04/02/24 0750           Goals   Nutrition Goal RD appointment deferred at this time --      Comment -- Patient was informed on why it is important to maintain a balanced diet when dealing with Respiratory issues. Explained that it takes a lot of energy to breath and when they are short of breath often they will need to have a good diet to help keep up with the calories they are expending for breathing.      Expected Outcome -- Short: Choose and plan snacks accordingly to patients caloric intake to improve breathing. Long: Maintain a diet independently that meets their  caloric intake to aid in daily shortness of breath.         Nutrition Goals Discharge (Final Nutrition Goals Re-Evaluation):  Nutrition Goals Re-Evaluation - 04/02/24 0750       Goals   Comment Patient was informed on why it is important to maintain a balanced diet when dealing with Respiratory issues. Explained that it takes a lot of energy to breath and when they are short of breath often they will need to have a good diet to help keep up with the calories they are expending for breathing.    Expected Outcome Short: Choose and plan snacks accordingly to patients caloric intake to improve breathing. Long: Maintain a diet independently that meets their caloric intake to aid in daily shortness of breath.          Psychosocial: Target Goals: Acknowledge presence or absence of significant depression and/or stress, maximize coping skills, provide positive support system. Participant is able to verbalize types and ability to use techniques and skills needed for reducing stress and depression.   Education: Stress, Anxiety, and Depression - Group verbal and visual presentation to define topics covered.  Reviews how body is impacted by stress, anxiety, and depression.  Also discusses healthy ways to reduce stress and to treat/manage anxiety and depression. Written material provided at class time.   Education: Sleep Hygiene -Provides group verbal and written instruction about how sleep can affect your health.  Define sleep hygiene, discuss sleep cycles and impact of sleep habits. Review good sleep hygiene tips.   Initial Review & Psychosocial Screening:  Initial Psych Review & Screening - 01/23/24 1046       Initial Review   Current issues with None Identified      Family Dynamics   Good Support System? Yes      Barriers   Psychosocial barriers to participate in program There are no identifiable barriers or psychosocial needs.;The patient should benefit from training in stress management  and relaxation.      Screening Interventions   Interventions Encouraged to exercise;To provide  support and resources with identified psychosocial needs    Expected Outcomes Short Term goal: Utilizing psychosocial counselor, staff and physician to assist with identification of specific Stressors or current issues interfering with healing process. Setting desired goal for each stressor or current issue identified.;Long Term Goal: Stressors or current issues are controlled or eliminated.;Short Term goal: Identification and review with participant of any Quality of Life or Depression concerns found by scoring the questionnaire.;Long Term goal: The participant improves quality of Life and PHQ9 Scores as seen by post scores and/or verbalization of changes          Quality of Life Scores:   Scores of 19 and below usually indicate a poorer quality of life in these areas.  A difference of  2-3 points is a clinically meaningful difference.  A difference of 2-3 points in the total score of the Quality of Life Index has been associated with significant improvement in overall quality of life, self-image, physical symptoms, and general health in studies assessing change in quality of life.  PHQ-9: Review Flowsheet  More data exists      03/26/2024 01/30/2024 10/27/2021 05/30/2021 08/03/2020  Depression screen PHQ 2/9  Decreased Interest 0 1 1 0 1  Down, Depressed, Hopeless 1 0 1 1 1   PHQ - 2 Score 1 1 2 1 2   Altered sleeping 0 1 1 0 1  Tired, decreased energy 1 1 1 2 2   Change in appetite 0 1 0 0 1  Feeling bad or failure about yourself  1 0 0 0 0  Trouble concentrating 1 1 1  0 0  Moving slowly or fidgety/restless 1 1 0 0 0  Suicidal thoughts 0 0 0 0 0  PHQ-9 Score 5 6 5 3 6   Difficult doing work/chores Not difficult at all Somewhat difficult Somewhat difficult Not difficult at all Not difficult at all   Interpretation of Total Score  Total Score Depression Severity:  1-4 = Minimal depression, 5-9 =  Mild depression, 10-14 = Moderate depression, 15-19 = Moderately severe depression, 20-27 = Severe depression   Psychosocial Evaluation and Intervention:  Psychosocial Evaluation - 01/23/24 1058       Psychosocial Evaluation & Interventions   Comments Erin Middleton is coming to the program with systolic heart failure. She notes her shortness of breath has been her main issue, but can tell a difference with her medication now. She has issue with  her vision, so she no longer drives and relies on her daughter for transportation. When asked about stress concerns, she mentions currently she feels like she is doing okay. She is looking forward to attending the program to work on her stamina and strength    Expected Outcomes Short: attend the program for education and exercise Long: develop and maintain positive self care habits.    Continue Psychosocial Services  Follow up required by staff          Psychosocial Re-Evaluation:  Psychosocial Re-Evaluation     Row Name 03/26/24 0850 04/02/24 0751           Psychosocial Re-Evaluation   Current issues with None Identified Current Stress Concerns      Comments Erin Middleton denies any stress, depression, or anxiety at this time. She is sleeping good with an average of 6-7 hours a night, which is normal for her. She has a good support system. Her PHQ was reasses to a score of 5 today. Reviewed patient health questionnaire (PHQ-9) with patient for follow up. Previously, patients score  indicated signs/symptoms of depression.  Reviewed to see if patient is improving symptom wise while in program.  Score declined and patient states that it is because she has a little more stamina.      Expected Outcomes Short: Attend rehab for exercise benefits on stress management. Long: Continue a positive outlook on life. Short: Continue to attend [facility] regularly for regular exercise and social engagement. Long: Continue to improve symptoms and manage a positive mental state.       Interventions Encouraged to attend Cardiac Rehabilitation for the exercise Encouraged to attend Pulmonary Rehabilitation for the exercise      Continue Psychosocial Services  Follow up required by staff Follow up required by staff         Psychosocial Discharge (Final Psychosocial Re-Evaluation):  Psychosocial Re-Evaluation - 04/02/24 0751       Psychosocial Re-Evaluation   Current issues with Current Stress Concerns    Comments Reviewed patient health questionnaire (PHQ-9) with patient for follow up. Previously, patients score indicated signs/symptoms of depression.  Reviewed to see if patient is improving symptom wise while in program.  Score declined and patient states that it is because she has a little more stamina.    Expected Outcomes Short: Continue to attend [facility] regularly for regular exercise and social engagement. Long: Continue to improve symptoms and manage a positive mental state.    Interventions Encouraged to attend Pulmonary Rehabilitation for the exercise    Continue Psychosocial Services  Follow up required by staff          Vocational Rehabilitation: Provide vocational rehab assistance to qualifying candidates.   Vocational Rehab Evaluation & Intervention:  Vocational Rehab - 01/23/24 1046       Initial Vocational Rehab Evaluation & Intervention   Assessment shows need for Vocational Rehabilitation No          Education: Education Goals: Education classes will be provided on a variety of topics geared toward better understanding of heart health and risk factor modification. Participant will state understanding/return demonstration of topics presented as noted by education test scores.  Learning Barriers/Preferences:  Learning Barriers/Preferences - 01/23/24 1046       Learning Barriers/Preferences   Learning Barriers Sight    Learning Preferences None          General Cardiac Education Topics:  AED/CPR: - Group verbal and written  instruction with the use of models to demonstrate the basic use of the AED with the basic ABC's of resuscitation.   Test and Procedures: - Group verbal and visual presentation and models provide information about basic cardiac anatomy and function. Reviews the testing methods done to diagnose heart disease and the outcomes of the test results. Describes the treatment choices: Medical Management, Angioplasty, or Coronary Bypass Surgery for treating various heart conditions including Myocardial Infarction, Angina, Valve Disease, and Cardiac Arrhythmias. Written material provided at class time.   Medication Safety: - Group verbal and visual instruction to review commonly prescribed medications for heart and lung disease. Reviews the medication, class of the drug, and side effects. Includes the steps to properly store meds and maintain the prescription regimen. Written material provided at class time.   Intimacy: - Group verbal instruction through game format to discuss how heart and lung disease can affect sexual intimacy. Written material provided at class time.   Know Your Numbers and Heart Failure: - Group verbal and visual instruction to discuss disease risk factors for cardiac and pulmonary disease and treatment options.  Reviews associated critical  values for Overweight/Obesity, Hypertension, Cholesterol, and Diabetes.  Discusses basics of heart failure: signs/symptoms and treatments.  Introduces Heart Failure Zone chart for action plan for heart failure. Written material provided at class time.   Infection Prevention: - Provides verbal and written material to individual with discussion of infection control including proper hand washing and proper equipment cleaning during exercise session. Flowsheet Row Pulmonary Rehab from 01/30/2024 in [facility] Cardiac and Pulmonary Rehab  Date 01/30/24  Educator MB  Instruction Review Code 1- Verbalizes Understanding    Falls Prevention: - Provides  verbal and written material to individual with discussion of falls prevention and safety. Flowsheet Row Pulmonary Rehab from 01/30/2024 in [facility] Cardiac and Pulmonary Rehab  Date 01/30/24  Educator MB  Instruction Review Code 1- Verbalizes Understanding    Other: -Provides group and verbal instruction on various topics (see comments)   Knowledge Questionnaire Score:  Knowledge Questionnaire Score - 01/30/24 1535       Knowledge Questionnaire Score   Pre Score 25/26          Core Components/Risk Factors/Patient Goals at Admission:  Personal Goals and Risk Factors at Admission - 01/30/24 1536       Core Components/Risk Factors/Patient Goals on Admission    Weight Management Yes;Weight Maintenance    Intervention Weight Management: Develop a combined nutrition and exercise program designed to reach desired caloric intake, while maintaining appropriate intake of nutrient and fiber, sodium and fats, and appropriate energy expenditure required for the weight goal.;Weight Management: Provide education and appropriate resources to help participant work on and attain dietary goals.;Weight Management/Obesity: Establish reasonable short term and long term weight goals.    Admit Weight 138 lb (62.6 kg)    Goal Weight: Short Term 138 lb (62.6 kg)    Goal Weight: Long Term 138 lb (62.6 kg)    Expected Outcomes Weight Maintenance: Understanding of the daily nutrition guidelines, which includes 25-35% calories from fat, 7% or less cal from saturated fats, less than 200mg  cholesterol, less than 1.5gm of sodium, & 5 or more servings of fruits and vegetables daily;Understanding recommendations for meals to include 15-35% energy as protein, 25-35% energy from fat, 35-60% energy from carbohydrates, less than 200mg  of dietary cholesterol, 20-35 gm of total fiber daily;Understanding of distribution of calorie intake throughout the day with the consumption of 4-5 meals/snacks;Short Term: Continue to assess and  modify interventions until short term weight is achieved;Long Term: Adherence to nutrition and physical activity/exercise program aimed toward attainment of established weight goal    Diabetes Yes    Intervention Provide education about signs/symptoms and action to take for hypo/hyperglycemia.;Provide education about proper nutrition, including hydration, and aerobic/resistive exercise prescription along with prescribed medications to achieve blood glucose in normal ranges: Fasting glucose 65-99 mg/dL    Expected Outcomes Short Term: Participant verbalizes understanding of the signs/symptoms and immediate care of hyper/hypoglycemia, proper foot care and importance of medication, aerobic/resistive exercise and nutrition plan for blood glucose control.;Long Term: Attainment of HbA1C < 7%.    Heart Failure Yes    Intervention Provide a combined exercise and nutrition program that is supplemented with education, support and counseling about heart failure. Directed toward relieving symptoms such as shortness of breath, decreased exercise tolerance, and extremity edema.    Expected Outcomes Improve functional capacity of life;Short term: Attendance in program 2-3 days a week with increased exercise capacity. Reported lower sodium intake. Reported increased fruit and vegetable intake. Reports medication compliance.;Short term: Daily weights obtained and reported for increase.  Utilizing diuretic protocols set by physician.;Long term: Adoption of self-care skills and reduction of barriers for early signs and symptoms recognition and intervention leading to self-care maintenance.    Hypertension Yes    Intervention Provide education on lifestyle modifcations including regular physical activity/exercise, weight management, moderate sodium restriction and increased consumption of fresh fruit, vegetables, and low fat dairy, alcohol moderation, and smoking cessation.;Monitor prescription use compliance.    Expected  Outcomes Short Term: Continued assessment and intervention until BP is < 140/4mm HG in hypertensive participants. < 130/62mm HG in hypertensive participants with diabetes, heart failure or chronic kidney disease.;Long Term: Maintenance of blood pressure at goal levels.    Lipids Yes    Intervention Provide education and support for participant on nutrition & aerobic/resistive exercise along with prescribed medications to achieve LDL 70mg , HDL >40mg .    Expected Outcomes Short Term: Participant states understanding of desired cholesterol values and is compliant with medications prescribed. Participant is following exercise prescription and nutrition guidelines.;Long Term: Cholesterol controlled with medications as prescribed, with individualized exercise RX and with personalized nutrition plan. Value goals: LDL < 70mg , HDL > 40 mg.          Education:Diabetes - Individual verbal and written instruction to review signs/symptoms of diabetes, desired ranges of glucose level fasting, after meals and with exercise. Acknowledge that pre and post exercise glucose checks will be done for 3 sessions at entry of program. Flowsheet Row Pulmonary Rehab from 01/30/2024 in [facility] Cardiac and Pulmonary Rehab  Date 01/30/24  Educator MB  Instruction Review Code 1- Verbalizes Understanding    Core Components/Risk Factors/Patient Goals Review:   Goals and Risk Factor Review     Row Name 03/26/24 0852 04/02/24 0749           Core Components/Risk Factors/Patient Goals Review   Personal Goals Review Weight Management/Obesity;Hypertension;Diabetes;Heart Failure Improve shortness of breath with ADL's      Review Pat weighed 136.8 lbs today and she feels like she is doing good with maintaining her weight. She checks her weight at home and monitors due to her heart failure. She checks her blood pressure a couple times a week and it has been normal. She checks her blood sugar regularly with a monitor and it has been  really good. Spoke to patient about their shortness of breath and what they can do to improve. Patient has been informed of breathing techniques when starting the program. Patient is informed to tell staff if they have had any med changes and that certain meds they are taking or not taking can be causing shortness of breath.      Expected Outcomes Short: Continue monitoring weight, blood pressure, and blood sugar levels. Long: Continue to manage cardiovascular risk factors Short: Attend [facility] regularly to improve shortness of breath with ADL's. Long: maintain independence with ADL's         Core Components/Risk Factors/Patient Goals at Discharge (Final Review):   Goals and Risk Factor Review - 04/02/24 0749       Core Components/Risk Factors/Patient Goals Review   Personal Goals Review Improve shortness of breath with ADL's    Review Spoke to patient about their shortness of breath and what they can do to improve. Patient has been informed of breathing techniques when starting the program. Patient is informed to tell staff if they have had any med changes and that certain meds they are taking or not taking can be causing shortness of breath.    Expected Outcomes Short: Attend  [facility] regularly to improve shortness of breath with ADL's. Long: maintain independence with ADL's          ITP Comments:  ITP Comments     Row Name 01/23/24 1058 01/30/24 1524 02/06/24 0807 02/27/24 0957 03/26/24 1446   ITP Comments Initial phone call completed. Diagnosis can be found in [facility] 7/9. EP Orientation scheduled for Wednesday 7/30 at 1:30p. Completed and gym orientation for respiratory care services. Initial ITP created and sent for review to Dr. Faud Aleskerov, Medical Director. First full day of exercise!  Patient was oriented to gym and equipment including functions, settings, policies, and procedures.  Patient's individual exercise prescription and treatment plan were reviewed.  All starting  workloads were established based on the results of the 6 minute walk test done at initial orientation visit.  The plan for exercise progression was also introduced and progression will be customized based on patient's performance and goals. 30 Day review completed. Medical Director ITP review done; changes made as directed and signed approval by Medical Director.New to Program. 30 Day review completed. Medical Director ITP review done; changes made as directed and signed approval by Medical Director.    Row Name 04/23/24 1000           ITP Comments 30 Day review completed. Medical Director ITP review done; changes made as directed and signed approval by Medical Director.          Comments: 30 day review

## 2024-04-28 ENCOUNTER — Encounter

## 2024-04-28 DIAGNOSIS — I5022 Chronic systolic (congestive) heart failure: Secondary | ICD-10-CM | POA: Diagnosis not present

## 2024-04-28 NOTE — Progress Notes (Signed)
 Daily Session Note  Patient Details  Name: Erin Middleton MRN: [id number] Date of Birth: 1945/09/10 Referring Provider:   [facility]rad [facility]s [facility] from 01/30/2024 in [facility] Cardiac and Pulmonary Rehab  Referring Provider Florencio Kava, MD    Encounter Date: 04/28/2024  Check In:  Session Check In - 04/28/24 0811       Check-In   Supervising physician immediately available to respond to emergencies See telemetry face sheet for immediately available ER MD    Location [facility]-Cardiac & Pulmonary Rehab    Staff Present Burnard Davenport RN,BSN,MPA;Joseph [facility] Dyane BS, ACSM CEP, Exercise Physiologist;Jason Elnor RDN,LDN    Virtual Visit No    Medication changes reported     No    Fall or balance concerns reported    No    Warm-up and Cool-down Performed on first and last piece of equipment    Resistance Training Performed Yes    VAD Patient? No    PAD/SET Patient? No      Pain Assessment   Currently in Pain? No/denies             Social History   Tobacco Use  Smoking Status Never  Smokeless Tobacco Never    Goals Met:  Proper associated with RPD/PD & O2 Sat Independence with exercise equipment Using PLB without cueing & demonstrates good technique Exercise tolerated well No report of concerns or symptoms today Strength training completed today  Goals Unmet:  Not Applicable  Comments: Pt able to follow exercise prescription today without complaint.  Will continue to monitor for progression.    Dr. Oneil Pinal is Medical Director for [facility] Cardiac Rehabilitation.  Dr. Fuad Aleskerov is Medical Director for [facility] Pulmonary Rehabilitation.

## 2024-04-30 ENCOUNTER — Encounter

## 2024-04-30 DIAGNOSIS — I5022 Chronic systolic (congestive) heart failure: Secondary | ICD-10-CM

## 2024-04-30 NOTE — Progress Notes (Signed)
 Daily Session Note  Patient Details  Name: Erin Middleton MRN: [id number] Date of Birth: 1945/10/18 Referring Provider:   Conrad Ports [facility] Rehab from 01/30/2024 in [facility] Cardiac and Pulmonary Rehab  Referring Provider Florencio Kava, MD    Encounter Date: 04/30/2024  Check In:  Session Check In - 04/30/24 0739       Check-In   Supervising physician immediately available to respond to emergencies See telemetry face sheet for immediately available ER MD    Location [facility]-Cardiac & Pulmonary Rehab    Staff Present Burnard Davenport RN,BSN,MPA;Joseph Rolinda NORWOOD HARMAN Verlie Laird, [place], Exercise Physiologist;Margaret Best, MS, Exercise Physiologist    Virtual Visit No    Medication changes reported     No    Fall or balance concerns reported    No    Warm-up and Cool-down Performed on first and last piece of equipment    Resistance Training Performed Yes    VAD Patient? No    PAD/SET Patient? No      Pain Assessment   Currently in Pain? No/denies             Social History   Tobacco Use  Smoking Status Never  Smokeless Tobacco Never    Goals Met:  Proper associated with RPD/PD & O2 Sat Independence with exercise equipment Using PLB without cueing & demonstrates good technique Exercise tolerated well No report of concerns or symptoms today Strength training completed today  Goals Unmet:  Not Applicable  Comments: Pt able to follow exercise prescription today without complaint.  Will continue to monitor for progression.    Dr. Oneil Pinal is Medical Director for [facility] Cardiac Rehabilitation.  Dr. Fuad Aleskerov is Medical Director for [facility] Pulmonary Rehabilitation.

## 2024-05-05 ENCOUNTER — Encounter: Attending: Internal Medicine | Admitting: *Deleted

## 2024-05-05 DIAGNOSIS — I5022 Chronic systolic (congestive) heart failure: Secondary | ICD-10-CM | POA: Insufficient documentation

## 2024-05-05 NOTE — Progress Notes (Signed)
 Daily Session Note  Patient Details  Name: Erin Middleton MRN: [id number] Date of Birth: 16-Aug-1945 Referring Provider:   Conrad Ports [facility] Rehab from 01/30/2024 in [facility] Cardiac and Pulmonary Rehab  Referring Provider Florencio Kava, MD    Encounter Date: 05/05/2024  Check In:  Session Check In - 05/05/24 0743       Check-In   Supervising physician immediately available to respond to emergencies See telemetry face sheet for immediately available ER MD    Location [facility]-Cardiac & Pulmonary Rehab    Staff Present Othel Durand, RN, BSN, CCRP;Jason Elnor RDN,LDN;Joseph [employer] BS, ACSM CEP, Exercise Physiologist    Virtual Visit No    Medication changes reported     No    Fall or balance concerns reported    No    Warm-up and Cool-down Performed on first and last piece of equipment    Resistance Training Performed Yes    VAD Patient? No    PAD/SET Patient? No      Pain Assessment   Currently in Pain? No/denies             Social History   Tobacco Use  Smoking Status Never  Smokeless Tobacco Never    Goals Met:  Proper associated with RPD/PD & O2 Sat Independence with exercise equipment Exercise tolerated well No report of concerns or symptoms today  Goals Unmet:  Not Applicable  Comments: Pt able to follow exercise prescription today without complaint.  Will continue to monitor for progression.    Dr. Oneil Pinal is Medical Director for [facility] Cardiac Rehabilitation.  Dr. Fuad Aleskerov is Medical Director for [facility] Pulmonary Rehabilitation.

## 2024-05-07 ENCOUNTER — Encounter: Admitting: Emergency Medicine

## 2024-05-07 DIAGNOSIS — I5022 Chronic systolic (congestive) heart failure: Secondary | ICD-10-CM | POA: Diagnosis not present

## 2024-05-07 NOTE — Progress Notes (Signed)
 Daily Session Note  Patient Details  Name: Erin Middleton MRN: [id number] Date of Birth: 04/02/1946 Referring Provider:   Conrad Ports [facility]monary Rehab from 01/30/2024 in [facility] Cardiac and Pulmonary Rehab  Referring Provider Florencio Kava, MD    Encounter Date: 05/07/2024  Check In:  Session Check In - 05/07/24 0740       Check-In   Supervising physician immediately available to respond to emergencies See telemetry face sheet for immediately available ER MD    Location [facility]-Cardiac & Pulmonary Rehab    Staff Present Leita Franks RN,BSN;Maxon Conetta BS, Exercise Physiologist;Joseph Rolinda NORWOOD HARMAN Hobert Best, MS, Exercise Physiologist    Virtual Visit No    Medication changes reported     No    Fall or balance concerns reported    No    Warm-up and Cool-down Performed on first and last piece of equipment    Resistance Training Performed Yes    VAD Patient? No    PAD/SET Patient? No      Pain Assessment   Currently in Pain? No/denies             Social History   Tobacco Use  Smoking Status Never  Smokeless Tobacco Never    Goals Met:  Proper associated with RPD/PD & O2 Sat Independence with exercise equipment Using PLB without cueing & demonstrates good technique Exercise tolerated well No report of concerns or symptoms today Strength training completed today  Goals Unmet:  Not Applicable  Comments: Pt able to follow exercise prescription today without complaint.  Will continue to monitor for progression.    Dr. Oneil Pinal is Medical Director for [facility] Cardiac Rehabilitation.  Dr. Fuad Aleskerov is Medical Director for [facility] Pulmonary Rehabilitation.

## 2024-05-12 ENCOUNTER — Encounter

## 2024-05-12 DIAGNOSIS — I5022 Chronic systolic (congestive) heart failure: Secondary | ICD-10-CM

## 2024-05-12 NOTE — Progress Notes (Signed)
 Daily Session Note  Patient Details  Name: ALISHBA NAPLES MRN: [id number] Date of Birth: March 12, 1946 Referring Provider:   Conrad Ports [facility] Rehab from 01/30/2024 in [facility] Cardiac and Pulmonary Rehab  Referring Provider Florencio Kava, MD    Encounter Date: 05/12/2024  Check In:  Session Check In - 05/12/24 0748       Check-In   Staff Present Ronal Picket, RN, DNP, NE-BC;Zacheriah Stumpe RN,BSN,MPA;Laura Cates RN,BSN;Joseph [facility] BS, ACSM CEP, Exercise Physiologist    Virtual Visit No    Medication changes reported     No    Fall or balance concerns reported    No    Warm-up and Cool-down Performed on first and last piece of equipment    Resistance Training Performed Yes    VAD Patient? No    PAD/SET Patient? No      Pain Assessment   Currently in Pain? No/denies             Social History   Tobacco Use  Smoking Status Never  Smokeless Tobacco Never    Goals Met:  Proper associated with RPD/PD & O2 Sat Independence with exercise equipment Using PLB without cueing & demonstrates good technique Exercise tolerated well No report of concerns or symptoms today Strength training completed today  Goals Unmet:  Not Applicable  Comments: Pt able to follow exercise prescription today without complaint.  Will continue to monitor for progression.    Dr. Oneil Pinal is Medical Director for [facility] Cardiac Rehabilitation.  Dr. Fuad Aleskerov is Medical Director for [facility] Pulmonary Rehabilitation.

## 2024-05-14 ENCOUNTER — Encounter

## 2024-05-14 DIAGNOSIS — I5022 Chronic systolic (congestive) heart failure: Secondary | ICD-10-CM | POA: Diagnosis not present

## 2024-05-14 NOTE — Progress Notes (Signed)
 Daily Session Note  Patient Details  Name: Erin Middleton MRN: [id number] Date of Birth: 1946-01-03 Referring Provider:   Conrad Ports [facility]monary Rehab from 01/30/2024 in [facility] Cardiac and Pulmonary Rehab  Referring Provider Florencio Kava, MD    Encounter Date: 05/14/2024  Check In:  Session Check In - 05/14/24 0734       Check-In   Supervising physician immediately available to respond to emergencies See telemetry face sheet for immediately available ER MD    Location [facility]-Cardiac & Pulmonary Rehab    Staff Present Burnard Davenport RN,BSN,MPA;Joseph Hood RCP,RRT,BSRT;Maxon Burnell BS, Exercise Physiologist;Margaret Best, MS, Exercise Physiologist    Virtual Visit No    Medication changes reported     No    Fall or balance concerns reported    No    Warm-up and Cool-down Performed on first and last piece of equipment    Resistance Training Performed Yes    VAD Patient? No    PAD/SET Patient? No      Pain Assessment   Currently in Pain? No/denies             Social History   Tobacco Use  Smoking Status Never  Smokeless Tobacco Never    Goals Met:  Proper associated with RPD/PD & O2 Sat Independence with exercise equipment Using PLB without cueing & demonstrates good technique Exercise tolerated well No report of concerns or symptoms today Strength training completed today  Goals Unmet:  Not Applicable  Comments: Pt able to follow exercise prescription today without complaint.  Will continue to monitor for progression.    Dr. Oneil Pinal is Medical Director for [facility] Cardiac Rehabilitation.  Dr. Fuad Aleskerov is Medical Director for [facility] Pulmonary Rehabilitation.

## 2024-05-19 ENCOUNTER — Encounter

## 2024-05-19 ENCOUNTER — Encounter (INDEPENDENT_AMBULATORY_CARE_PROVIDER_SITE_OTHER): Admitting: Ophthalmology

## 2024-05-19 DIAGNOSIS — H353122 Nonexudative age-related macular degeneration, left eye, intermediate dry stage: Secondary | ICD-10-CM

## 2024-05-19 DIAGNOSIS — H35033 Hypertensive retinopathy, bilateral: Secondary | ICD-10-CM | POA: Diagnosis not present

## 2024-05-19 DIAGNOSIS — I5022 Chronic systolic (congestive) heart failure: Secondary | ICD-10-CM

## 2024-05-19 DIAGNOSIS — I10 Essential (primary) hypertension: Secondary | ICD-10-CM

## 2024-05-19 DIAGNOSIS — H353114 Nonexudative age-related macular degeneration, right eye, advanced atrophic with subfoveal involvement: Secondary | ICD-10-CM | POA: Diagnosis not present

## 2024-05-19 DIAGNOSIS — H43813 Vitreous degeneration, bilateral: Secondary | ICD-10-CM

## 2024-05-19 NOTE — Progress Notes (Signed)
 Daily Session Note  Patient Details  Name: Erin Middleton MRN: [id number] Date of Birth: [birth year] Referring Provider:   Conrad Ports [facility]monary Rehab from 01/30/2024 in [facility] Cardiac and Pulmonary Rehab  Referring Provider Florencio Kava, MD    Encounter Date: 05/19/2024  Check In:  Session Check In - 05/19/24 0742       Check-In   Supervising physician immediately available to respond to emergencies See telemetry face sheet for immediately available ER MD    Location [facility]-Cardiac & Pulmonary Rehab    Staff Present Burnard Davenport [facility] Cates RN,BSN;Joseph [facility] BS, ACSM CEP, Exercise Physiologist    Virtual Visit No    Medication changes reported     No    Fall or balance concerns reported    No    Warm-up and Cool-down Performed on first and last piece of equipment    Resistance Training Performed Yes    VAD Patient? No    PAD/SET Patient? No      Pain Assessment   Currently in Pain? No/denies             Social History   Tobacco Use  Smoking Status Never  Smokeless Tobacco Never    Goals Met:  Proper associated with RPD/PD & O2 Sat Independence with exercise equipment Using PLB without cueing & demonstrates good technique Exercise tolerated well No report of concerns or symptoms today Strength training completed today  Goals Unmet:  Not Applicable  Comments: Pt able to follow exercise prescription today without complaint.  Will continue to monitor for progression.    Dr. Oneil Pinal is Medical Director for [facility] Cardiac Rehabilitation.  Dr. Fuad Aleskerov is Medical Director for [facility] Pulmonary Rehabilitation.

## 2024-05-21 ENCOUNTER — Encounter: Admitting: *Deleted

## 2024-05-21 DIAGNOSIS — I5022 Chronic systolic (congestive) heart failure: Secondary | ICD-10-CM

## 2024-05-21 NOTE — Progress Notes (Signed)
 Pulmonary Individual Treatment Plan  Patient Details  Name: TANGANYIKA BOWLDS MRN: [id number] Date of Birth: 03/06/1946 Referring Provider:   [facility] Pulmonary Rehab from 01/30/2024 in [facility] Cardiac and Pulmonary Rehab  Referring Provider Florencio Kava, MD    Initial Encounter Date:  Flowsheet Row Pulmonary Rehab from 01/30/2024 in [facility] Cardiac and Pulmonary Rehab  Date 01/30/24    Visit Diagnosis: Heart failure, chronic systolic (HCC)  Patient's Home Medications on Admission:  Current Outpatient Medications:    ACCU-CHEK GUIDE test strip, CHECK BLOOD SUGAR EVERY DAY, Disp: 100 strip, Rfl: 2   Accu-Chek Softclix Lancets lancets, CHECK BLOOD SUGAR ONCE DAILY, Disp: 100 each, Rfl: 12   amLODipine  (NORVASC ) 5 MG tablet, TAKE 1 TABLET BY MOUTH EVERY DAY IN THE EVENING (Patient not taking: Reported on 02/19/2024), Disp: 90 tablet, Rfl: 2   blood glucose meter kit and supplies, Dispense based on patient and insurance preference. Check blood sugar once a day (FOR ICD-10 E10.9, E11.9)., Disp: 1 each, Rfl: 0   Cholecalciferol  10 MCG (400 UNIT) CAPS, , Disp: , Rfl:    furosemide (LASIX) 40 MG tablet, Take 40 mg by mouth. (Patient taking differently: Take 20 mg by mouth daily.), Disp: , Rfl:    metFORMIN  (GLUCOPHAGE ) 500 MG tablet, Take 1 tablet (500 mg total) by mouth 2 (two) times daily with a meal., Disp: 180 tablet, Rfl: 1   Multiple Vitamins-Minerals (PRESERVISION/LUTEIN ) CAPS, Take 1 capsule by mouth at bedtime., Disp: , Rfl:    omeprazole  (PRILOSEC) 20 MG capsule, Take 20 mg by mouth., Disp: , Rfl:    rosuvastatin  (CRESTOR ) 40 MG tablet, Take 1 tablet by mouth at bedtime., Disp: , Rfl:    spironolactone (ALDACTONE) 25 MG tablet, Take 25 mg by mouth daily. (Patient not taking: Reported on 02/19/2024), Disp: , Rfl:    telmisartan (MICARDIS) 80 MG tablet, TAKE 1 TABLET BY MOUTH EVERY DAY (Patient not taking: Reported on 02/19/2024), Disp: 90 tablet, Rfl: 3   vitamin E  400 UNIT capsule, Take  400 Units by mouth daily. 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 breast cancer - chemotherapy   Cancer (HCC)    Breast   Diabetes mellitus without complication (HCC)    GERD (gastroesophageal reflux disease)    Headache    migraines h/o   Hypertension    Macular degeneration of both eyes, unspecified type    pt states its pretty advanced.   Personal history of chemotherapy     Tobacco Use: Social History   Tobacco Use  Smoking Status Never  Smokeless Tobacco Never    Labs: Review Flowsheet  More data exists      Latest Ref Rng & Units 03/09/2020 08/03/2020 12/21/2020 05/30/2021 10/27/2021  Labs for ITP Cardiac and Pulmonary Rehab  Cholestrol 100 - 199 mg/dL - 879  - 887  -  LDL (calc) 0 - 99 mg/dL - 47  - 38  -  HDL-C >60 mg/dL - 57  - 60  -  Trlycerides 0 - 149 mg/dL - 81  - 67  -  Hemoglobin A1c 4.8 - 5.6 % 6.5  6.6  7.0  6.8  7.7      Pulmonary Assessment Scores:  Pulmonary Assessment Scores     Row Name 01/30/24 1534         ADL UCSD   ADL Phase Entry  SOB Score total 31     Rest 1     Walk 1     Stairs 4     Bath 0     Dress 0     Shop 3       CAT Score   CAT Score 17       mMRC Score   mMRC Score 3        UCSD: Self-administered rating of dyspnea associated with activities of daily living (ADLs) 6-point scale (0 = not at all to 5 = maximal or unable to do because of breathlessness)  Scoring Scores range from 0 to 120.  Minimally important difference is 5 units  CAT: CAT can identify the health impairment of COPD patients and is better correlated with disease progression.  CAT has a scoring range of zero to 40. The CAT score is classified into four groups of low (less than 10), medium (10 - 20), high (21-30) and very high (31-40) based on the impact level of disease on health status. A CAT score over 10 suggests significant symptoms.   A worsening CAT score could be explained by an exacerbation, poor medication adherence, poor inhaler technique, or progression of COPD or comorbid conditions.  CAT MCID is 2 points  mMRC: mMRC (Modified Medical Research Council) Dyspnea Scale is used to assess the degree of baseline functional disability in patients of respiratory disease due to dyspnea. No minimal important difference is established. A decrease in score of 1 point or greater is considered a positive change.   Pulmonary Function Assessment:   Exercise Target Goals: Exercise Program Goal: Individual exercise prescription set using results from initial 6 min walk test and THRR while considering  patient's activity barriers and safety.   Exercise Prescription Goal: Initial exercise prescription builds to 30-45 minutes a day of aerobic activity, 2-3 days per week.  Home exercise guidelines will be given to patient during program as part of exercise prescription that the participant will acknowledge.  Education: Aerobic Exercise: - Group verbal and visual presentation on the components of exercise prescription. Introduces F.I.T.T principle from ACSM for exercise prescriptions.  Reviews F.I.T.T. principles of aerobic exercise including progression. Written material provided at class time.   Education: Resistance Exercise: - Group verbal and visual presentation on the components of exercise prescription. Introduces F.I.T.T principle from ACSM for exercise prescriptions  Reviews F.I.T.T. principles of resistance exercise including progression. Written material provided at class time.    Education: Exercise & Equipment Safety: - Individual verbal instruction and demonstration of equipment use and safety with use of the equipment. Flowsheet Row Pulmonary Rehab from 01/30/2024 in [facility] Cardiac and Pulmonary Rehab  Date 01/30/24  Educator MB  Instruction Review Code 1- Verbalizes Understanding    Education: Exercise Physiology &  General Exercise Guidelines: - Group verbal and written instruction with models to review the exercise physiology of the cardiovascular system and associated critical values. Provides general exercise guidelines with specific guidelines to those with heart or lung disease.    Education: Flexibility, Balance, Mind/Body Relaxation: - Group verbal and visual presentation with interactive activity on the components of exercise prescription. Introduces F.I.T.T principle from ACSM for exercise prescriptions. Reviews F.I.T.T. principles of flexibility and balance exercise training including progression. Also discusses the mind body connection.  Reviews various relaxation techniques to help reduce and manage stress (i.e. Deep breathing, progressive muscle relaxation, and visualization). Balance handout provided to take home. Written material provided at class time.   Activity Barriers &  Risk Stratification:  Activity Barriers & Cardiac Risk Stratification - 01/30/24 1526       Activity Barriers & Cardiac Risk Stratification   Activity Barriers Back Problems;Shortness of Breath;Balance Concerns;Left Knee Replacement          6 Minute Walk:  6 Minute Walk     Row Name 01/30/24 1524         6 Minute Walk   Phase Initial     Distance 830 feet     Walk Time 6 minutes     # of Rest Breaks 0     MPH 1.57     METS 1.68     RPE 11     Perceived Dyspnea  1     VO2 Peak 5.89     Symptoms Yes (comment)     Comments 6/10 back pain     Resting HR 81 bpm     Resting BP 128/60     Resting Oxygen Saturation  96 %     Exercise Oxygen Saturation  during 6 min walk 96 %     Max Ex. HR 97 bpm     Max Ex. BP 160/80     2 Minute Post BP 138/72       Interval HR   1 Minute HR 89     2 Minute HR 95     3 Minute HR 97     4 Minute HR 94     5 Minute HR 94     6 Minute HR 97     2 Minute Post HR 80     Interval Heart Rate? Yes       Interval Oxygen   Interval Oxygen? Yes     Baseline Oxygen  Saturation % 96 %     1 Minute Oxygen Saturation % 97 %     1 Minute Liters of Oxygen 0 L     2 Minute Oxygen Saturation % 97 %     2 Minute Liters of Oxygen 0 L     3 Minute Oxygen Saturation % 98 %     3 Minute Liters of Oxygen 0 L     4 Minute Oxygen Saturation % 96 %     4 Minute Liters of Oxygen 0 L     5 Minute Oxygen Saturation % 96 %     5 Minute Liters of Oxygen 0 L     6 Minute Oxygen Saturation % 96 %     6 Minute Liters of Oxygen 0 L     2 Minute Post Oxygen Saturation % 97 %     2 Minute Post Liters of Oxygen 0 L       Oxygen Initial Assessment:  Oxygen Initial Assessment - 01/23/24 1108       Home Oxygen   Home Oxygen Device None    Sleep Oxygen Prescription None    Home Exercise Oxygen Prescription None    Home Resting Oxygen Prescription None    Compliance with Home Oxygen Use Yes      Intervention   Short Term Goals To learn and understand importance of maintaining oxygen saturations>88%;To learn and demonstrate proper pursed lip breathing techniques or other breathing techniques. ;To learn and demonstrate proper use of respiratory medications;To learn and understand importance of monitoring SPO2 with pulse oximeter and demonstrate accurate use of the pulse oximeter.    Long  Term Goals Verbalizes importance of monitoring SPO2 with pulse oximeter and return  demonstration;Maintenance of O2 saturations>88%;Exhibits proper breathing techniques, such as pursed lip breathing or other method taught during program session;Compliance with respiratory medication          Oxygen Re-Evaluation:  Oxygen Re-Evaluation     Row Name 02/06/24 9191 04/02/24 0747 05/12/24 0743         Program Oxygen Prescription   Program Oxygen Prescription -- None None       Home Oxygen   Home Oxygen Device None None None     Sleep Oxygen Prescription None CPAP CPAP     Liters per minute -- 0 0     Home Exercise Oxygen Prescription None None None     Home Resting Oxygen  Prescription None None None     Compliance with Home Oxygen Use Yes Yes Yes       Goals/Expected Outcomes   Short Term Goals To learn and understand importance of maintaining oxygen saturations>88%;To learn and demonstrate proper pursed lip breathing techniques or other breathing techniques. ;To learn and demonstrate proper use of respiratory medications;To learn and understand importance of monitoring SPO2 with pulse oximeter and demonstrate accurate use of the pulse oximeter. To learn and demonstrate proper pursed lip breathing techniques or other breathing techniques.  To learn and understand importance of maintaining oxygen saturations>88%;To learn and understand importance of monitoring SPO2 with pulse oximeter and demonstrate accurate use of the pulse oximeter.     Long  Term Goals Verbalizes importance of monitoring SPO2 with pulse oximeter and return demonstration;Maintenance of O2 saturations>88%;Exhibits proper breathing techniques, such as pursed lip breathing or other method taught during program session;Compliance with respiratory medication Exhibits proper breathing techniques, such as pursed lip breathing or other method taught during program session Maintenance of O2 saturations>88%;Verbalizes importance of monitoring SPO2 with pulse oximeter and return demonstration     Comments Reviewed PLB technique with pt.  Talked about how it works and it's importance in maintaining their exercise saturations. Informed patient how to perform the Pursed Lipped breathing technique. Told patient to Inhale through the nose and out the mouth with pursed lips to keep their airways open, help oxygenate them better, practice when at rest or doing strenuous activity. Patient Verbalizes understanding of technique and will work on and be reiterated during [facility]. Patient has a pulse oximeter to check her oxygen saturation at home. Informed and explained why it is important to have one. Reviewed that oxygen  saturations should be 88 percent and above.     Goals/Expected Outcomes Short: Become more profiecient at using PLB. Long: Become independent at using PLB. Short: use PLB with exertion. Long: use PLB on exertion proficiently and independently. Short: monitor oxygen at home with exertion. Long: maintain oxygen saturations above 88 percent independently.        Oxygen Discharge (Final Oxygen Re-Evaluation):  Oxygen Re-Evaluation - 05/12/24 0743       Program Oxygen Prescription   Program Oxygen Prescription None      Home Oxygen   Home Oxygen Device None    Sleep Oxygen Prescription CPAP    Liters per minute 0    Home Exercise Oxygen Prescription None    Home Resting Oxygen Prescription None    Compliance with Home Oxygen Use Yes      Goals/Expected Outcomes   Short Term Goals To learn and understand importance of maintaining oxygen saturations>88%;To learn and understand importance of monitoring SPO2 with pulse oximeter and demonstrate accurate use of the pulse oximeter.    Long  Term Goals  Maintenance of O2 saturations>88%;Verbalizes importance of monitoring SPO2 with pulse oximeter and return demonstration    Comments Patient has a pulse oximeter to check her oxygen saturation at home. Informed and explained why it is important to have one. Reviewed that oxygen saturations should be 88 percent and above.    Goals/Expected Outcomes Short: monitor oxygen at home with exertion. Long: maintain oxygen saturations above 88 percent independently.          Initial Exercise Prescription:  Initial Exercise Prescription - 01/30/24 1500       Date of Initial Exercise RX and Referring Provider   Date 01/30/24    Referring Provider Florencio Kava, MD      Oxygen   Maintain Oxygen Saturation 88% or higher      Recumbant Bike   Level 1    RPM 50    Watts 15    Minutes 15    METs 1.68      NuStep   Level 1    SPM 80    Minutes 15    METs 1.68      Biostep-RELP   Level 1     SPM 50    Minutes 15    METs 1.68      Track   Laps 20   Hallway   Minutes 15    METs 2.09      Prescription Details   Frequency (times per week) 2    Duration Progress to 30 minutes of continuous aerobic without signs/symptoms of physical distress      Intensity   THRR 40-80% of Max Heartrate 104-128    Ratings of Perceived Exertion 11-13    Perceived Dyspnea 0-4      Progression   Progression Continue to progress workloads to maintain intensity without signs/symptoms of physical distress.      Resistance Training   Training Prescription Yes    Weight 3 lb    Reps 10-15          Perform Capillary Blood Glucose checks as needed.  Exercise Prescription Changes:   Exercise Prescription Changes     Row Name 01/30/24 1500 02/22/24 0800 03/05/24 1500 03/10/24 0800 03/20/24 0800     Response to Exercise   Blood Pressure (Admit) 128/60 122/62 104/62 -- 118/64   Blood Pressure (Exercise) 160/80 142/68 140/82 -- 136/70   Blood Pressure (Exit) 138/72 126/62 104/62 -- 122/64   Heart Rate (Admit) 81 bpm 80 bpm 80 bpm -- 81 bpm   Heart Rate (Exercise) 97 bpm 83 bpm 84 bpm -- 82 bpm   Heart Rate (Exit) 80 bpm 80 bpm 80 bpm -- 82 bpm   Oxygen Saturation (Admit) 96 % 97 % 96 % -- 96 %   Oxygen Saturation (Exercise) 98 % 96 % 95 % -- 95 %   Oxygen Saturation (Exit) 97 % 97 % 98 % -- 97 %   Rating of Perceived Exertion (Exercise) 11 13 13  -- 14   Perceived Dyspnea (Exercise) 1 1 2  -- 1   Symptoms 6/10 back pain -- none -- none   Comments results First three days of exercise -- -- --   Duration -- Progress to 30 minutes of  aerobic without signs/symptoms of physical distress Progress to 30 minutes of  aerobic without signs/symptoms of physical distress Progress to 30 minutes of  aerobic without signs/symptoms of physical distress Progress to 30 minutes of  aerobic without signs/symptoms of physical distress   Intensity -- THRR unchanged  THRR unchanged THRR unchanged THRR  unchanged     Progression   Progression -- Continue to progress workloads to maintain intensity without signs/symptoms of physical distress. Continue to progress workloads to maintain intensity without signs/symptoms of physical distress. Continue to progress workloads to maintain intensity without signs/symptoms of physical distress. Continue to progress workloads to maintain intensity without signs/symptoms of physical distress.   Average METs 1.68 1.82 1.65 1.65 1.87     Resistance Training   Training Prescription -- Yes Yes Yes Yes   Weight -- 3 lb 3 lb 3 lb 3 lb   Reps -- 10-15 10-15 10-15 10-15     Interval Training   Interval Training -- No No No No     Recumbant Bike   Level -- 1 -- -- 2   Watts -- 15 -- -- 16   Minutes -- 15 -- -- 15   METs -- 2.76 -- -- 2.75     NuStep   Level -- 1 5 5 4    Minutes -- 15 30 30 30    METs -- 1.5 1.8 1.8 1.7     Biostep-RELP   Level -- 1 -- -- 1   Minutes -- 30 -- -- 15   METs -- 2 -- -- 2     Track   Laps -- 6 8 8 8    Minutes -- 15 15 15 15    METs -- 1.33 1.44 1.44 1.44     Home Exercise Plan   Plans to continue exercise at -- -- -- Home (comment)  walking Home (comment)  walking   Frequency -- -- -- Add 2 additional days to program exercise sessions. Add 2 additional days to program exercise sessions.   Initial Home Exercises Provided -- -- -- 03/10/24 03/10/24     Oxygen   Maintain Oxygen Saturation -- 88% or higher 88% or higher 88% or higher 88% or higher    Row Name 04/03/24 1700 04/17/24 1000 04/28/24 1100 05/12/24 1100       Response to Exercise   Blood Pressure (Admit) 138/76 126/58 120/62 122/66    Blood Pressure (Exit) 108/62 106/60 106/68 110/66    Heart Rate (Admit) 81 bpm 86 bpm 81 bpm 80 bpm    Heart Rate (Exercise) 84 bpm 80 bpm 99 bpm 85 bpm    Heart Rate (Exit) 80 bpm 81 bpm 80 bpm 80 bpm    Oxygen Saturation (Admit) 97 % 95 % 98 % 97 %    Oxygen Saturation (Exercise) 96 % 96 % 95 % 95 %    Oxygen  Saturation (Exit) 98 % 98 % 98 % 97 %    Rating of Perceived Exertion (Exercise) 13 15 14 14     Perceived Dyspnea (Exercise) 1 1 0 2    Symptoms none none none none    Duration Progress to 30 minutes of  aerobic without signs/symptoms of physical distress Progress to 30 minutes of  aerobic without signs/symptoms of physical distress Progress to 30 minutes of  aerobic without signs/symptoms of physical distress Progress to 30 minutes of  aerobic without signs/symptoms of physical distress    Intensity THRR unchanged THRR unchanged THRR unchanged THRR unchanged      Progression   Progression Continue to progress workloads to maintain intensity without signs/symptoms of physical distress. Continue to progress workloads to maintain intensity without signs/symptoms of physical distress. Continue to progress workloads to maintain intensity without signs/symptoms of physical distress. Continue to progress workloads to maintain  intensity without signs/symptoms of physical distress.    Average METs 1.94 1.7 1.85 1.77      Resistance Training   Training Prescription Yes Yes Yes Yes    Weight 3 lb 3 lb 3 lb 3 lb    Reps 10-15 10-15 10-15 10-15      Interval Training   Interval Training No No No No      Recumbant Bike   Level 2 -- -- --    Watts 15 -- -- --    Minutes 15 -- -- --    METs 2.76 -- -- --      NuStep   Level 2 5 4 4     Minutes 30 15 15 15     METs 2 1.8 2 2       Biostep-RELP   Level 1 1 2 2     Minutes 30 15 15 15     METs 1 2 2 2       Track   Laps -- 8  Hall 22 17  Hall    Minutes -- 15 15 15     METs -- 1.55 2.2 1.92      Home Exercise Plan   Plans to continue exercise at Home (comment)  walking Home (comment)  walking Home (comment)  walking Home (comment)  walking    Frequency Add 2 additional days to program exercise sessions. Add 2 additional days to program exercise sessions. Add 2 additional days to program exercise sessions. Add 2 additional days to program exercise  sessions.    Initial Home Exercises Provided 03/10/24 03/10/24 03/10/24 03/10/24      Oxygen   Maintain Oxygen Saturation 88% or higher 88% or higher 88% or higher 88% or higher       Exercise Comments:   Exercise Comments     Row Name 02/06/24 0807           Exercise Comments First full day of exercise!  Patient was oriented to gym and equipment including functions, settings, policies, and procedures.  Patient's individual exercise prescription and treatment plan were reviewed.  All starting workloads were established based on the results of the 6 minute walk test done at initial orientation visit.  The plan for exercise progression was also introduced and progression will be customized based on patient's performance and goals.          Exercise Goals and Review:   Exercise Goals     Row Name 01/30/24 1532             Exercise Goals   Increase Physical Activity Yes       Intervention Provide advice, education, support and counseling about physical activity/exercise needs.;Develop an individualized exercise prescription for aerobic and resistive training based on initial evaluation findings, risk stratification, comorbidities and participant's personal goals.       Expected Outcomes Short Term: Attend rehab on a regular basis to increase amount of physical activity.;Long Term: Add in home exercise to make exercise part of routine and to increase amount of physical activity.;Long Term: Exercising regularly at least 3-5 days a week.       Increase Strength and Stamina Yes       Intervention Provide advice, education, support and counseling about physical activity/exercise needs.;Develop an individualized exercise prescription for aerobic and resistive training based on initial evaluation findings, risk stratification, comorbidities and participant's personal goals.       Expected Outcomes Short Term: Increase workloads from initial exercise prescription for resistance, speed, and  METs.;Short Term: Perform resistance training exercises routinely during rehab and add in resistance training at home;Long Term: Improve cardiorespiratory fitness, muscular endurance and strength as measured by increased METs and functional capacity ( )       Able to understand and use rate of perceived exertion (RPE) scale Yes       Intervention Provide education and explanation on how to use RPE scale       Expected Outcomes Short Term: Able to use RPE daily in rehab to express subjective intensity level;Long Term:  Able to use RPE to guide intensity level when exercising independently       Able to understand and use Dyspnea scale Yes       Intervention Provide education and explanation on how to use Dyspnea scale       Expected Outcomes Short Term: Able to use Dyspnea scale daily in rehab to express subjective sense of shortness of breath during exertion;Long Term: Able to use Dyspnea scale to guide intensity level when exercising independently       Knowledge and understanding of Target Heart Rate Range (THRR) Yes       Intervention Provide education and explanation of THRR including how the numbers were predicted and where they are located for reference       Expected Outcomes Short Term: Able to state/look up THRR;Short Term: Able to use daily as guideline for intensity in rehab;Long Term: Able to use THRR to govern intensity when exercising independently       Able to check pulse independently Yes       Intervention Provide education and demonstration on how to check pulse in carotid and radial arteries.;Review the importance of being able to check your own pulse for safety during independent exercise       Expected Outcomes Long Term: Able to check pulse independently and accurately;Short Term: Able to explain why pulse checking is important during independent exercise       Understanding of Exercise Prescription Yes       Intervention Provide education, explanation, and written materials  on patient's individual exercise prescription       Expected Outcomes Short Term: Able to explain program exercise prescription;Long Term: Able to explain home exercise prescription to exercise independently          Exercise Goals Re-Evaluation :  Exercise Goals Re-Evaluation     Row Name 02/06/24 [phone] 02/22/24 0852 03/05/24 1546 03/10/24 0816 03/20/24 0856     Exercise Goal Re-Evaluation   Exercise Goals Review Increase Physical Activity;Able to understand and use rate of perceived exertion (RPE) scale;Knowledge and understanding of Target Heart Rate Range (THRR);Understanding of Exercise Prescription;Increase Strength and Stamina;Able to understand and use Dyspnea scale;Able to check pulse independently Increase Physical Activity;Increase Strength and Stamina;Understanding of Exercise Prescription Increase Physical Activity;Increase Strength and Stamina;Understanding of Exercise Prescription Increase Physical Activity;Able to understand and use rate of perceived exertion (RPE) scale;Knowledge and understanding of Target Heart Rate Range (THRR);Understanding of Exercise Prescription;Increase Strength and Stamina;Able to understand and use Dyspnea scale;Able to check pulse independently Increase Physical Activity;Understanding of Exercise Prescription;Increase Strength and Stamina   Comments Reviewed RPE and dyspnea scale, THR and program prescription with pt today.  Pt voiced understanding and was given a copy of goals to take home. Bruna is off to a good start in rehab. She did well at level 1 on the T4 nustep, biostep, and the recumbent bike. She also did well walking six laps on the track. We will continue to  monitor her progress in the program. Bruna is off to a good start in rehab. She increased to level 5 on the T4 nustep. She increased her laps on the track to 8 laps. We will continue to monitor her progress in the program. Reviewed home exercise with pt today from 7:50 am to 8:00am.  Pt plans to  walk for exercise.  Reviewed THR, pulse, RPE, sign and symptoms, pulse oximetery and when to call 911 or MD.  Also discussed weather considerations and indoor options.  Pt voiced understanding. Bruna is doing well in rehab. She increased to level 2 on the recumbent bike. She maintained 8 laps on the track and level 1 on the biostep. She decreased down slightly to level 4 on the T4 nustep for 30 minutes. We will continue to monitor her progress in the program.   Expected Outcomes Short: Use RPE daily to regulate intensity. Long: Follow program prescription in THR. -- Short: Continue to progressively increase T4 nustep workload and track laps. Long: Continue exercise to improve strength and stamina. Short: add 1-2 days a week of exercise on off days of rehab. Long: maintain independent exercise routine upon graduation from program. Short: Try for more laps on the track and increase back to level 5 on the T4 nustep. Long: Continue exercise to improve strength and stamina.    Row Name 04/03/24 1713 04/17/24 1041 04/28/24 1127 05/12/24 1135 05/12/24 1136     Exercise Goal Re-Evaluation   Exercise Goals Review Increase Physical Activity;Understanding of Exercise Prescription;Increase Strength and Stamina Increase Physical Activity;Understanding of Exercise Prescription;Increase Strength and Stamina Increase Physical Activity;Understanding of Exercise Prescription;Increase Strength and Stamina Increase Physical Activity;Understanding of Exercise Prescription;Increase Strength and Stamina Increase Physical Activity;Understanding of Exercise Prescription;Increase Strength and Stamina   Comments Bruna is doing well in rehab. She maintained level 2 on the recumbent bike and level 1 on the biostep. She decreased her level to 2 on the T4 nustep, which was previously level 4. We will continue to monitor her progress in the program. Bruna continues to do well in rehab. She was recently able to increase to level 5 on the T4  nustep. She was also able to maintain 8 laps in the hallway. We will continue to monitor her progress in the program. Bruna is doing well in rehab. She was recently able to increase her track laps from 8 to 22, as well as increase from level 1 to 2 on the biostep. We will continue to monitor her progress in the program. Bruna continues to do well in rehab. She has been able to maintain level 2 on the Bruna continues to do well in rehab. She has been able to maintain level 2 on the biostep, and level 4 on the T4 nustep. She also walked 17 laps in the hallway. We will continue to moniotr her progress in the program.   Expected Outcomes Short: Increase back to level 4 on the T4 nustep. Long: Continue exercise to improve strength and stamina. Short: Continue to increase track laps, return to using the recumbent bike. Long: Continue exercise to improve strength and stamina. Short: Continue to increase track laps and level on the biostep. Long: Continue exercise to improve strength and stamina. -- Short: Continue to push for more track laps. Long: Continue exercise to improve strength and stamina.      Discharge Exercise Prescription (Final Exercise Prescription Changes):  Exercise Prescription Changes - 05/12/24 1100       Response to Exercise  Blood Pressure (Admit) 122/66    Blood Pressure (Exit) 110/66    Heart Rate (Admit) 80 bpm    Heart Rate (Exercise) 85 bpm    Heart Rate (Exit) 80 bpm    Oxygen Saturation (Admit) 97 %    Oxygen Saturation (Exercise) 95 %    Oxygen Saturation (Exit) 97 %    Rating of Perceived Exertion (Exercise) 14    Perceived Dyspnea (Exercise) 2    Symptoms none    Duration Progress to 30 minutes of  aerobic without signs/symptoms of physical distress    Intensity THRR unchanged      Progression   Progression Continue to progress workloads to maintain intensity without signs/symptoms of physical distress.    Average METs 1.77      Resistance Training   Training  Prescription Yes    Weight 3 lb    Reps 10-15      Interval Training   Interval Training No      NuStep   Level 4    Minutes 15    METs 2      Biostep-RELP   Level 2    Minutes 15    METs 2      Track   Laps 17   Hall   Minutes 15    METs 1.92      Home Exercise Plan   Plans to continue exercise at Home (comment)   walking   Frequency Add 2 additional days to program exercise sessions.    Initial Home Exercises Provided 03/10/24      Oxygen   Maintain Oxygen Saturation 88% or higher          Nutrition:  Target Goals: Understanding of nutrition guidelines, daily intake of sodium 1500mg , cholesterol 200mg , calories 30% from fat and 7% or less from saturated fats, daily to have 5 or more servings of fruits and vegetables.  Education: Nutrition 1 -Group instruction provided by verbal, written material, interactive activities, discussions, models, and posters to present general guidelines for heart healthy nutrition including macronutrients, label reading, and promoting whole foods over processed counterparts. Education serves as pensions consultant of discussion of heart healthy eating for all. Written material provided at class time.     Education: Nutrition 2 -Group instruction provided by verbal, written material, interactive activities, discussions, models, and posters to present general guidelines for heart healthy nutrition including sodium, cholesterol, and saturated fat. Providing guidance of habit forming to improve blood pressure, cholesterol, and body weight. Written material provided at class time.     Biometrics:  Pre Biometrics - 01/30/24 1533       Pre Biometrics   Height 5' 1.3 (1.557 m)    Weight 138 lb (62.6 kg)    Waist Circumference 27.5 inches    Hip Circumference 42.8 inches    Waist to Hip Ratio 0.64 %    BMI (Calculated) 25.82    Single Leg Stand 4 seconds           Nutrition Therapy Plan and Nutrition Goals:  Nutrition Therapy & Goals  - 01/30/24 1535       Nutrition Therapy   RD appointment deferred Yes      Personal Nutrition Goals   Nutrition Goal RD appointment deferred at this time      Intervention Plan   Intervention Prescribe, educate and counsel regarding individualized specific dietary modifications aiming towards targeted core components such as weight, hypertension, lipid management, diabetes, heart failure and other comorbidities.  Expected Outcomes Short Term Goal: Understand basic principles of dietary content, such as calories, fat, sodium, cholesterol and nutrients.          Nutrition Assessments:  MEDIFICTS Score Key: >=70 Need to make dietary changes  40-70 Heart Healthy Diet <= 40 Therapeutic Level Cholesterol Diet  Flowsheet Row Pulmonary Rehab from 01/30/2024 in [facility] Cardiac and Pulmonary Rehab  Picture Your Plate Total Score on Admission 53   Picture Your Plate Scores: <59 Unhealthy dietary pattern with much room for improvement. 41-50 Dietary pattern unlikely to meet recommendations for good health and room for improvement. 51-60 More healthful dietary pattern, with some room for improvement.  >60 Healthy dietary pattern, although there may be some specific behaviors that could be improved.   Nutrition Goals Re-Evaluation:  Nutrition Goals Re-Evaluation     Row Name 03/26/24 [phone] 04/02/24 0750 05/12/24 0747         Goals   Nutrition Goal RD appointment deferred at this time -- RD appointment deferred at this time     Comment -- Patient was informed on why it is important to maintain a balanced diet when dealing with Respiratory issues. Explained that it takes a lot of energy to breath and when they are short of breath often they will need to have a good diet to help keep up with the calories they are expending for breathing. --     Expected Outcome -- Short: Choose and plan snacks accordingly to patients caloric intake to improve breathing. Long: Maintain a diet independently that  meets their caloric intake to aid in daily shortness of breath. --        Nutrition Goals Discharge (Final Nutrition Goals Re-Evaluation):  Nutrition Goals Re-Evaluation - 05/12/24 0747       Goals   Nutrition Goal RD appointment deferred at this time          Psychosocial: Target Goals: Acknowledge presence or absence of significant depression and/or stress, maximize coping skills, provide positive support system. Participant is able to verbalize types and ability to use techniques and skills needed for reducing stress and depression.   Education: Stress, Anxiety, and Depression - Group verbal and visual presentation to define topics covered.  Reviews how body is impacted by stress, anxiety, and depression.  Also discusses healthy ways to reduce stress and to treat/manage anxiety and depression.  Written material provided at class time.   Education: Sleep Hygiene -Provides group verbal and written instruction about how sleep can affect your health.  Define sleep hygiene, discuss sleep cycles and impact of sleep habits. Review good sleep hygiene tips.    Initial Review & Psychosocial Screening:  Initial Psych Review & Screening - 01/23/24 1046       Initial Review   Current issues with None Identified      Family Dynamics   Good Support System? Yes      Barriers   Psychosocial barriers to participate in program There are no identifiable barriers or psychosocial needs.;The patient should benefit from training in stress management and relaxation.      Screening Interventions   Interventions Encouraged to exercise;To provide support and resources with identified psychosocial needs    Expected Outcomes Short Term goal: Utilizing psychosocial counselor, staff and physician to assist with identification of specific Stressors or current issues interfering with healing process. Setting desired goal for each stressor or current issue identified.;Long Term Goal: Stressors or current  issues are controlled or eliminated.;Short Term goal: Identification and review with participant  of any Quality of Life or Depression concerns found by scoring the questionnaire.;Long Term goal: The participant improves quality of Life and PHQ9 Scores as seen by post scores and/or verbalization of changes          Quality of Life Scores:  Scores of 19 and below usually indicate a poorer quality of life in these areas.  A difference of  2-3 points is a clinically meaningful difference.  A difference of 2-3 points in the total score of the Quality of Life Index has been associated with significant improvement in overall quality of life, self-image, physical symptoms, and general health in studies assessing change in quality of life.  PHQ-9: Review Flowsheet  More data exists      05/12/2024 03/26/2024 01/30/2024 10/27/2021 05/30/2021  Depression screen PHQ 2/9  Decreased Interest 1 0 1 1 0  Down, Depressed, Hopeless 0 1 0 1 1  PHQ - 2 Score 1 1 1 2 1   Altered sleeping 0 0 1 1 0  Tired, decreased energy 1 1 1 1 2   Change in appetite 1 0 1 0 0  Feeling bad or failure about yourself  0 1 0 0 0  Trouble concentrating 0 1 1 1  0  Moving slowly or fidgety/restless 1 1 1  0 0  Suicidal thoughts 0 0 0 0 0  PHQ-9 Score 4 5  6  5  3    Difficult doing work/chores Somewhat difficult Not difficult at all Somewhat difficult Somewhat difficult Not difficult at all    Details       Data saved with a previous flowsheet row definition        Interpretation of Total Score  Total Score Depression Severity:  1-4 = Minimal depression, 5-9 = Mild depression, 10-14 = Moderate depression, 15-19 = Moderately severe depression, 20-27 = Severe depression   Psychosocial Evaluation and Intervention:  Psychosocial Evaluation - 01/23/24 1058       Psychosocial Evaluation & Interventions   Comments Bruna is coming to the program with systolic heart failure. She notes her shortness of breath has been her main  issue, but can tell a difference with her medication now. She has issue with  her vision, so she no longer drives and relies on her daughter for transportation. When asked about stress concerns, she mentions currently she feels like she is doing okay. She is looking forward to attending the program to work on her stamina and strength    Expected Outcomes Short: attend the program for education and exercise Long: develop and maintain positive self care habits.    Continue Psychosocial Services  Follow up required by staff          Psychosocial Re-Evaluation:  Psychosocial Re-Evaluation     Row Name 03/26/24 0850 04/02/24 0751 05/12/24 0750         Psychosocial Re-Evaluation   Current issues with None Identified Current Stress Concerns Current Stress Concerns     Comments Bruna denies any stress, depression, or anxiety at this time. She is sleeping good with an average of 6-7 hours a night, which is normal for her. She has a good support system. Her PHQ was reasses to a score of 5 today. Reviewed patient health questionnaire (PHQ-9) with patient for follow up. Previously, patients score indicated signs/symptoms of depression.  Reviewed to see if patient is improving symptom wise while in program.  Score declined and patient states that it is because she has a little more stamina. Reviewed patient health  questionnaire (PHQ-9) with patient for follow up. Previously, patients score indicated signs/symptoms of depression.  Reviewed to see if patient is improving symptom wise while in program.  Score improved and patient states that it is because she is able to do a little more.     Expected Outcomes Short: Attend rehab for exercise benefits on stress management. Long: Continue a positive outlook on life. Short: Continue to attend [facility] regularly for regular exercise and social engagement. Long: Continue to improve symptoms and manage a positive mental state. Short: Continue to attend [facility]  regularly for regular exercise and social engagement. Long: Continue to improve symptoms and manage a positive mental state.     Interventions Encouraged to attend Cardiac Rehabilitation for the exercise Encouraged to attend Pulmonary Rehabilitation for the exercise Encouraged to attend Pulmonary Rehabilitation for the exercise     Continue Psychosocial Services  Follow up required by staff Follow up required by staff Follow up required by staff        Psychosocial Discharge (Final Psychosocial Re-Evaluation):  Psychosocial Re-Evaluation - 05/12/24 0750       Psychosocial Re-Evaluation   Current issues with Current Stress Concerns    Comments Reviewed patient health questionnaire (PHQ-9) with patient for follow up. Previously, patients score indicated signs/symptoms of depression.  Reviewed to see if patient is improving symptom wise while in program.  Score improved and patient states that it is because she is able to do a little more.    Expected Outcomes Short: Continue to attend [facility] regularly for regular exercise and social engagement. Long: Continue to improve symptoms and manage a positive mental state.    Interventions Encouraged to attend Pulmonary Rehabilitation for the exercise    Continue Psychosocial Services  Follow up required by staff          Education: Education Goals: Education classes will be provided on a weekly basis, covering required topics. Participant will state understanding/return demonstration of topics presented.  Learning Barriers/Preferences:  Learning Barriers/Preferences - 01/23/24 1046       Learning Barriers/Preferences   Learning Barriers Sight    Learning Preferences None          General Pulmonary Education Topics:  Infection Prevention: - Provides verbal and written material to individual with discussion of infection control including proper hand washing and proper equipment cleaning during exercise session. Flowsheet Row Pulmonary  Rehab from 01/30/2024 in [facility] Cardiac and Pulmonary Rehab  Date 01/30/24  Educator MB  Instruction Review Code 1- Verbalizes Understanding    Falls Prevention: - Provides verbal and written material to individual with discussion of falls prevention and safety. Flowsheet Row Pulmonary Rehab from 01/30/2024 in [facility] Cardiac and Pulmonary Rehab  Date 01/30/24  Educator MB  Instruction Review Code 1- Verbalizes Understanding    Chronic Lung Disease Review: - Group verbal instruction with posters, models, PowerPoint presentations and videos,  to review new updates, new respiratory medications, new advancements in procedures and treatments. Providing information on websites and 800 numbers for continued self-education. Includes information about supplement oxygen, available portable oxygen systems, continuous and intermittent flow rates, oxygen safety, concentrators, and Medicare reimbursement for oxygen. Explanation of Pulmonary Drugs, including class, frequency, complications, importance of spacers, rinsing mouth after steroid MDI's, and proper cleaning methods for nebulizers. Review of basic lung anatomy and physiology related to function, structure, and complications of lung disease. Review of risk factors. Discussion about methods for diagnosing sleep apnea and types of masks and machines for OSA. Includes a review of  the use of types of environmental controls: home humidity, furnaces, filters, dust mite/pet prevention, HEPA vacuums. Discussion about weather changes, air quality and the benefits of nasal washing. Instruction on Warning signs, infection symptoms, calling MD promptly, preventive modes, and value of vaccinations. Review of effective airway clearance, coughing and/or vibration techniques. Emphasizing that all should Create an Action Plan. Written material provided at class time.   AED/CPR: - Group verbal and written instruction with the use of models to demonstrate the basic use of the  AED with the basic ABC's of resuscitation.    Tests and Procedures:  - Group verbal and visual presentation and models provide information about basic cardiac anatomy and function. Reviews the testing methods done to diagnose heart disease and the outcomes of the test results. Describes the treatment choices: Medical Management, Angioplasty, or Coronary Bypass Surgery for treating various heart conditions including Myocardial Infarction, Angina, Valve Disease, and Cardiac Arrhythmias.  Written material provided at class time.   Medication Safety: - Group verbal and visual instruction to review commonly prescribed medications for heart and lung disease. Reviews the medication, class of the drug, and side effects. Includes the steps to properly store meds and maintain the prescription regimen.  Written material given at graduation.   Other: -Provides group and verbal instruction on various topics (see comments)   Knowledge Questionnaire Score:  Knowledge Questionnaire Score - 01/30/24 1535       Knowledge Questionnaire Score   Pre Score 25/26           Core Components/Risk Factors/Patient Goals at Admission:  Personal Goals and Risk Factors at Admission - 01/30/24 1536       Core Components/Risk Factors/Patient Goals on Admission    Weight Management Yes;Weight Maintenance    Intervention Weight Management: Develop a combined nutrition and exercise program designed to reach desired caloric intake, while maintaining appropriate intake of nutrient and fiber, sodium and fats, and appropriate energy expenditure required for the weight goal.;Weight Management: Provide education and appropriate resources to help participant work on and attain dietary goals.;Weight Management/Obesity: Establish reasonable short term and long term weight goals.    Admit Weight 138 lb (62.6 kg)    Goal Weight: Short Term 138 lb (62.6 kg)    Goal Weight: Long Term 138 lb (62.6 kg)    Expected Outcomes Weight  Maintenance: Understanding of the daily nutrition guidelines, which includes 25-35% calories from fat, 7% or less cal from saturated fats, less than 200mg  cholesterol, less than 1.5gm of sodium, & 5 or more servings of fruits and vegetables daily;Understanding recommendations for meals to include 15-35% energy as protein, 25-35% energy from fat, 35-60% energy from carbohydrates, less than 200mg  of dietary cholesterol, 20-35 gm of total fiber daily;Understanding of distribution of calorie intake throughout the day with the consumption of 4-5 meals/snacks;Short Term: Continue to assess and modify interventions until short term weight is achieved;Long Term: Adherence to nutrition and physical activity/exercise program aimed toward attainment of established weight goal    Diabetes Yes    Intervention Provide education about signs/symptoms and action to take for hypo/hyperglycemia.;Provide education about proper nutrition, including hydration, and aerobic/resistive exercise prescription along with prescribed medications to achieve blood glucose in normal ranges: Fasting glucose 65-99 mg/dL    Expected Outcomes Short Term: Participant verbalizes understanding of the signs/symptoms and immediate care of hyper/hypoglycemia, proper foot care and importance of medication, aerobic/resistive exercise and nutrition plan for blood glucose control.;Long Term: Attainment of HbA1C < 7%.    Heart Failure  Yes    Intervention Provide a combined exercise and nutrition program that is supplemented with education, support and counseling about heart failure. Directed toward relieving symptoms such as shortness of breath, decreased exercise tolerance, and extremity edema.    Expected Outcomes Improve functional capacity of life;Short term: Attendance in program 2-3 days a week with increased exercise capacity. Reported lower sodium intake. Reported increased fruit and vegetable intake. Reports medication compliance.;Short term: Daily  weights obtained and reported for increase. Utilizing diuretic protocols set by physician.;Long term: Adoption of self-care skills and reduction of barriers for early signs and symptoms recognition and intervention leading to self-care maintenance.    Hypertension Yes    Intervention Provide education on lifestyle modifcations including regular physical activity/exercise, weight management, moderate sodium restriction and increased consumption of fresh fruit, vegetables, and low fat dairy, alcohol moderation, and smoking cessation.;Monitor prescription use compliance.    Expected Outcomes Short Term: Continued assessment and intervention until BP is < 140/62mm HG in hypertensive participants. < 130/73mm HG in hypertensive participants with diabetes, heart failure or chronic kidney disease.;Long Term: Maintenance of blood pressure at goal levels.    Lipids Yes    Intervention Provide education and support for participant on nutrition & aerobic/resistive exercise along with prescribed medications to achieve LDL 70mg , HDL >40mg .    Expected Outcomes Short Term: Participant states understanding of desired cholesterol values and is compliant with medications prescribed. Participant is following exercise prescription and nutrition guidelines.;Long Term: Cholesterol controlled with medications as prescribed, with individualized exercise RX and with personalized nutrition plan. Value goals: LDL < 70mg , HDL > 40 mg.          Education:Diabetes - Individual verbal and written instruction to review signs/symptoms of diabetes, desired ranges of glucose level fasting, after meals and with exercise. Acknowledge that pre and post exercise glucose checks will be done for 3 sessions at entry of program. Flowsheet Row Pulmonary Rehab from 01/30/2024 in [facility] Cardiac and Pulmonary Rehab  Date 01/30/24  Educator MB  Instruction Review Code 1- Verbalizes Understanding    Know Your Numbers and Heart Failure: - Group  verbal and visual instruction to discuss disease risk factors for cardiac and pulmonary disease and treatment options.  Reviews associated critical values for Overweight/Obesity, Hypertension, Cholesterol, and Diabetes.  Discusses basics of heart failure: signs/symptoms and treatments.  Introduces Heart Failure Zone chart for action plan for heart failure. Written material provided at class time.   Core Components/Risk Factors/Patient Goals Review:   Goals and Risk Factor Review     Row Name 03/26/24 9147 04/02/24 0749 05/12/24 0745         Core Components/Risk Factors/Patient Goals Review   Personal Goals Review Weight Management/Obesity;Hypertension;Diabetes;Heart Failure Improve shortness of breath with ADL's Develop more efficient breathing techniques such as purse lipped breathing and diaphragmatic breathing and practicing self-pacing with activity.     Review Pat weighed 136.8 lbs today and she feels like she is doing good with maintaining her weight. She checks her weight at home and monitors due to her heart failure. She checks her blood pressure a couple times a week and it has been normal. She checks her blood sugar regularly with a monitor and it has been really good. Spoke to patient about their shortness of breath and what they can do to improve. Patient has been informed of breathing techniques when starting the program. Patient is informed to tell staff if they have had any med changes and that certain meds they are taking  or not taking can be causing shortness of breath. Diaphragmatic and PLB breathing explained and performed with patient. Patient has a better understanding of how to do these exercises to help with breathing performance and relaxation. Patient performed breathing techniques adequately and to practice further at home.     Expected Outcomes Short: Continue monitoring weight, blood pressure, and blood sugar levels. Long: Continue to manage cardiovascular risk factors Short:  Attend [facility] regularly to improve shortness of breath with ADL's. Long: maintain independence with ADL's Short: practice PLB and diaphragmatic breathing at home. Long: Use PLB and diaphragmatic breathing independently post [facility].        Core Components/Risk Factors/Patient Goals at Discharge (Final Review):   Goals and Risk Factor Review - 05/12/24 0745       Core Components/Risk Factors/Patient Goals Review   Personal Goals Review Develop more efficient breathing techniques such as purse lipped breathing and diaphragmatic breathing and practicing self-pacing with activity.    Review Diaphragmatic and PLB breathing explained and performed with patient. Patient has a better understanding of how to do these exercises to help with breathing performance and relaxation. Patient performed breathing techniques adequately and to practice further at home.    Expected Outcomes Short: practice PLB and diaphragmatic breathing at home. Long: Use PLB and diaphragmatic breathing independently post [facility].          ITP Comments:  ITP Comments     Row Name 01/23/24 1058 01/30/24 1524 02/06/24 0807 02/27/24 0957 03/26/24 1446   ITP Comments Initial phone call completed. Diagnosis can be found in [facility] 7/9. EP Orientation scheduled for Wednesday 7/30 at 1:30p. Completed and gym orientation for respiratory care services. Initial ITP created and sent for review to Dr. Faud Aleskerov, Medical Director. First full day of exercise!  Patient was oriented to gym and equipment including functions, settings, policies, and procedures.  Patient's individual exercise prescription and treatment plan were reviewed.  All starting workloads were established based on the results of the 6 minute walk test done at initial orientation visit.  The plan for exercise progression was also introduced and progression will be customized based on patient's performance and goals. 30 Day review completed. Medical Director ITP  review done; changes made as directed and signed approval by Medical Director.New to Program. 30 Day review completed. Medical Director ITP review done; changes made as directed and signed approval by Medical Director.    Row Name 04/23/24 1000 05/21/24 1306         ITP Comments 30 Day review completed. Medical Director ITP review done; changes made as directed and signed approval by Medical Director. 30 Day review completed. Medical Director ITP review done, changes made as directed, and signed approval by Medical Director.         Comments: 30 day ITP review

## 2024-05-21 NOTE — Progress Notes (Signed)
 Daily Session Note  Patient Details  Name: Erin Middleton MRN: [id number] Date of Birth: 02-21-1946 Referring Provider:   Conrad Ports Pulmonary Rehab from 01/30/2024 in [facility] Cardiac and Pulmonary Rehab  Referring Provider Florencio Kava, MD    Encounter Date: 05/21/2024  Check In:  Session Check In - 05/21/24 0751       Check-In   Supervising physician immediately available to respond to emergencies See telemetry face sheet for immediately available ER MD    Location [facility]-Cardiac & Pulmonary Rehab    Staff Present Rollene Paterson, MS, Exercise Physiologist;Noah Tickle, BS, Exercise Physiologist;Joseph Hood RCP,RRT,BSRT;Kelly Bollinger RN,BSN,MPA;Shatasia Cutshaw Peggi, RN, DNP, NE-BC    Virtual Visit No    Medication changes reported     No    Fall or balance concerns reported    No    Warm-up and Cool-down Performed on first and last piece of equipment    Resistance Training Performed Yes    VAD Patient? No    PAD/SET Patient? No      Pain Assessment   Currently in Pain? No/denies             Social History   Tobacco Use  Smoking Status Never  Smokeless Tobacco Never    Goals Met:  Proper associated with RPD/PD & O2 Sat Independence with exercise equipment Using PLB without cueing & demonstrates good technique Exercise tolerated well No report of concerns or symptoms today Strength training completed today  Goals Unmet:  Not Applicable  Comments: Pt able to follow exercise prescription today without complaint.  Will continue to monitor for progression.    Dr. Oneil Pinal is Medical Director for [facility] Cardiac Rehabilitation.  Dr. Fuad Aleskerov is Medical Director for [facility] Pulmonary Rehabilitation.

## 2024-05-26 ENCOUNTER — Encounter

## 2024-05-26 VITALS — Ht 61.3 in | Wt 137.6 lb

## 2024-05-26 DIAGNOSIS — I5022 Chronic systolic (congestive) heart failure: Secondary | ICD-10-CM

## 2024-05-26 NOTE — Progress Notes (Signed)
 Daily Session Note  Patient Details  Name: NAYAB ATEN MRN: [id number] Date of Birth: 06-22-1946 Referring Provider:   Conrad Ports Pulmonary Rehab from 01/30/2024 in [facility] Cardiac and Pulmonary Rehab  Referring Provider Florencio Kava, MD    Encounter Date: 05/26/2024  Check In:  Session Check In - 05/26/24 0757       Check-In   Supervising physician immediately available to respond to emergencies See telemetry face sheet for immediately available ER MD    Location [facility]-Cardiac & Pulmonary Rehab    Staff Present Burnard Davenport [facility] Dyane BS, ACSM CEP, Exercise Physiologist;Joseph Rolinda RCP,RRT,BSRT;Jason Elnor RDN,LDN    Virtual Visit No    Medication changes reported     No    Fall or balance concerns reported    No    Warm-up and Cool-down Performed on first and last piece of equipment    Resistance Training Performed Yes    VAD Patient? No    PAD/SET Patient? No      Pain Assessment   Currently in Pain? No/denies             Social History   Tobacco Use  Smoking Status Never  Smokeless Tobacco Never    Goals Met:  Proper associated with RPD/PD & O2 Sat Independence with exercise equipment Using PLB without cueing & demonstrates good technique Exercise tolerated well No report of concerns or symptoms today Strength training completed today  Goals Unmet:  Not Applicable  Comments: Pt able to follow exercise prescription today without complaint.  Will continue to monitor for progression.    6 Minute Walk     Row Name 01/30/24 1524 05/26/24 0759       6 Minute Walk   Phase Initial Discharge    Distance 830 feet 1050 feet    Distance % Change -- 27 %    Distance Feet Change -- 220 ft    Walk Time 6 minutes 6 minutes    # of Rest Breaks 0 0    MPH 1.57 2    METS 1.68 1.94    RPE 11 12    Perceived Dyspnea  1 1    VO2 Peak 5.89 6.79    Symptoms Yes (comment) No    Comments 6/10 back pain --    Resting HR 81 bpm 80 bpm     Resting BP 128/60 124/82    Resting Oxygen Saturation  96 % 96 %    Exercise Oxygen Saturation  during 6 min walk 96 % 97 %    Max Ex. HR 97 bpm 97 bpm    Max Ex. BP 160/80 142/60    2 Minute Post BP 138/72 130/70      Interval HR   1 Minute HR 89 88    2 Minute HR 95 92    3 Minute HR 97 94    4 Minute HR 94 92    5 Minute HR 94 97    6 Minute HR 97 85    2 Minute Post HR 80 81    Interval Heart Rate? Yes Yes      Interval Oxygen   Interval Oxygen? Yes Yes    Baseline Oxygen Saturation % 96 % 96 %    1 Minute Oxygen Saturation % 97 % 98 %    1 Minute Liters of Oxygen 0 L 0 L    2 Minute Oxygen Saturation % 97 % 97 %    2 Minute Liters  of Oxygen 0 L 0 L    3 Minute Oxygen Saturation % 98 % 97 %    3 Minute Liters of Oxygen 0 L 0 L    4 Minute Oxygen Saturation % 96 % 98 %    4 Minute Liters of Oxygen 0 L 0 L    5 Minute Oxygen Saturation % 96 % 98 %    5 Minute Liters of Oxygen 0 L 0 L    6 Minute Oxygen Saturation % 96 % 98 %    6 Minute Liters of Oxygen 0 L 0 L    2 Minute Post Oxygen Saturation % 97 % 97 %    2 Minute Post Liters of Oxygen 0 L 0 L       Dr. Oneil Pinal is Medical Director for [facility] Cardiac Rehabilitation.  Dr. Fuad Aleskerov is Medical Director for [facility] Pulmonary Rehabilitation.

## 2024-05-26 NOTE — Patient Instructions (Signed)
 Discharge Patient Instructions  Patient Details  Name: Erin Middleton MRN: [id number] Date of Birth: 07/02/1946 Referring Provider:  Florencio Cara BIRCH, MD   Number of Visits: 74  Reason for Discharge:  Patient reached a stable level of exercise. Patient independent in their exercise. Patient has met program and personal goals.  Smoking History:  Social History   Tobacco Use  Smoking Status Never  Smokeless Tobacco Never    Diagnosis:  Heart failure, chronic systolic (HCC)  Initial Exercise Prescription:  Initial Exercise Prescription - 01/30/24 1500       Date of Initial Exercise RX and Referring Provider   Date 01/30/24    Referring Provider Florencio Cara, MD      Oxygen   Maintain Oxygen Saturation 88% or higher      Recumbant Bike   Level 1    RPM 50    Watts 15    Minutes 15    METs 1.68      NuStep   Level 1    SPM 80    Minutes 15    METs 1.68      Biostep-RELP   Level 1    SPM 50    Minutes 15    METs 1.68      Track   Laps 20   Hallway   Minutes 15    METs 2.09      Prescription Details   Frequency (times per week) 2    Duration Progress to 30 minutes of continuous aerobic without signs/symptoms of physical distress      Intensity   THRR 40-80% of Max Heartrate 104-128    Ratings of Perceived Exertion 11-13    Perceived Dyspnea 0-4      Progression   Progression Continue to progress workloads to maintain intensity without signs/symptoms of physical distress.      Resistance Training   Training Prescription Yes    Weight 3 lb    Reps 10-15          Discharge Exercise Prescription (Final Exercise Prescription Changes):  Exercise Prescription Changes - 05/12/24 1100       Response to Exercise   Blood Pressure (Admit) 122/66    Blood Pressure (Exit) 110/66    Heart Rate (Admit) 80 bpm    Heart Rate (Exercise) 85 bpm    Heart Rate (Exit) 80 bpm    Oxygen Saturation (Admit) 97 %    Oxygen Saturation (Exercise) 95 %     Oxygen Saturation (Exit) 97 %    Rating of Perceived Exertion (Exercise) 14    Perceived Dyspnea (Exercise) 2    Symptoms none    Duration Progress to 30 minutes of  aerobic without signs/symptoms of physical distress    Intensity THRR unchanged      Progression   Progression Continue to progress workloads to maintain intensity without signs/symptoms of physical distress.    Average METs 1.77      Resistance Training   Training Prescription Yes    Weight 3 lb    Reps 10-15      Interval Training   Interval Training No      NuStep   Level 4    Minutes 15    METs 2      Biostep-RELP   Level 2    Minutes 15    METs 2      Track   Laps 17   Hall   Minutes 15    METs 1.92  Home Exercise Plan   Plans to continue exercise at Home (comment)   walking   Frequency Add 2 additional days to program exercise sessions.    Initial Home Exercises Provided 03/10/24      Oxygen   Maintain Oxygen Saturation 88% or higher          Functional Capacity:  6 Minute Walk     Row Name 01/30/24 1524 05/26/24 0759       6 Minute Walk   Phase Initial Discharge    Distance 830 feet 1050 feet    Distance % Change -- 27 %    Distance Feet Change -- 220 ft    Walk Time 6 minutes 6 minutes    # of Rest Breaks 0 0    MPH 1.57 2    METS 1.68 1.94    RPE 11 12    Perceived Dyspnea  1 1    VO2 Peak 5.89 6.79    Symptoms Yes (comment) No    Comments 6/10 back pain --    Resting HR 81 bpm 80 bpm    Resting BP 128/60 124/82    Resting Oxygen Saturation  96 % 96 %    Exercise Oxygen Saturation  during 6 min walk 96 % 97 %    Max Ex. HR 97 bpm 97 bpm    Max Ex. BP 160/80 142/60    2 Minute Post BP 138/72 130/70      Interval HR   1 Minute HR 89 88    2 Minute HR 95 92    3 Minute HR 97 94    4 Minute HR 94 92    5 Minute HR 94 97    6 Minute HR 97 85    2 Minute Post HR 80 81    Interval Heart Rate? Yes Yes      Interval Oxygen   Interval Oxygen? Yes Yes    Baseline  Oxygen Saturation % 96 % 96 %    1 Minute Oxygen Saturation % 97 % 98 %    1 Minute Liters of Oxygen 0 L 0 L    2 Minute Oxygen Saturation % 97 % 97 %    2 Minute Liters of Oxygen 0 L 0 L    3 Minute Oxygen Saturation % 98 % 97 %    3 Minute Liters of Oxygen 0 L 0 L    4 Minute Oxygen Saturation % 96 % 98 %    4 Minute Liters of Oxygen 0 L 0 L    5 Minute Oxygen Saturation % 96 % 98 %    5 Minute Liters of Oxygen 0 L 0 L    6 Minute Oxygen Saturation % 96 % 98 %    6 Minute Liters of Oxygen 0 L 0 L    2 Minute Post Oxygen Saturation % 97 % 97 %    2 Minute Post Liters of Oxygen 0 L 0 L       Nutrition & Weight - Outcomes:  Pre Biometrics - 01/30/24 1533       Pre Biometrics   Height 5' 1.3 (1.557 m)    Weight 138 lb (62.6 kg)    Waist Circumference 27.5 inches    Hip Circumference 42.8 inches    Waist to Hip Ratio 0.64 %    BMI (Calculated) 25.82    Single Leg Stand 4 seconds  Post Biometrics - 05/26/24 0804        Post  Biometrics   Height 5' 1.3 (1.557 m)    Weight 137 lb 9.6 oz (62.4 kg)    Waist Circumference 36.5 inches    Hip Circumference 41 inches    Waist to Hip Ratio 0.89 %    BMI (Calculated) 25.75    Single Leg Stand 4.1 seconds          Goals reviewed with patient; copy given to patient.

## 2024-05-28 ENCOUNTER — Encounter

## 2024-05-28 DIAGNOSIS — I5022 Chronic systolic (congestive) heart failure: Secondary | ICD-10-CM

## 2024-05-28 NOTE — Progress Notes (Signed)
 Daily Session Note  Patient Details  Name: Erin Middleton MRN: [id number] Date of Birth: 08/16/1945 Referring Provider:   Conrad Ports Pulmonary Rehab from 01/30/2024 in [facility] Cardiac and Pulmonary Rehab  Referring Provider Florencio Kava, MD    Encounter Date: 05/28/2024  Check In:  Session Check In - 05/28/24 0731       Check-In   Supervising physician immediately available to respond to emergencies See telemetry face sheet for immediately available ER MD    Location [facility]-Cardiac & Pulmonary Rehab    Staff Present Burnard Davenport RN,BSN,MPA;Maxon Conetta BS, Exercise Physiologist;Margaret Best, MS, Exercise Physiologist;Laura Cates RN,BSN    Virtual Visit No    Medication changes reported     No    Fall or balance concerns reported    No    Warm-up and Cool-down Performed on first and last piece of equipment    Resistance Training Performed Yes    VAD Patient? No    PAD/SET Patient? No      Pain Assessment   Currently in Pain? No/denies             Social History   Tobacco Use  Smoking Status Never  Smokeless Tobacco Never    Goals Met:  Proper associated with RPD/PD & O2 Sat Independence with exercise equipment Using PLB without cueing & demonstrates good technique Exercise tolerated well No report of concerns or symptoms today Strength training completed today  Goals Unmet:  Not Applicable  Comments: Pt able to follow exercise prescription today without complaint.  Will continue to monitor for progression.    Dr. Oneil Pinal is Medical Director for [facility] Cardiac Rehabilitation.  Dr. Fuad Aleskerov is Medical Director for [facility] Pulmonary Rehabilitation.

## 2024-06-02 ENCOUNTER — Encounter: Attending: Internal Medicine

## 2024-06-02 DIAGNOSIS — I5022 Chronic systolic (congestive) heart failure: Secondary | ICD-10-CM | POA: Diagnosis present

## 2024-06-02 NOTE — Progress Notes (Signed)
 Daily Session Note  Patient Details  Name: Erin Middleton MRN: [id number] Date of Birth: [birth year] Referring Provider:   Conrad Ports Pulmonary Rehab from 01/30/2024 in [facility] Cardiac and Pulmonary Rehab  Referring Provider Florencio Kava, MD    Encounter Date: 06/02/2024  Check In:  Session Check In - 06/02/24 0733       Check-In   Supervising physician immediately available to respond to emergencies See telemetry face sheet for immediately available ER MD    Location [facility]-Cardiac & Pulmonary Rehab    Staff Present Burnard Davenport RN,BSN,MPA;Joseph [facility] Dyane BS, ACSM CEP, Exercise Physiologist;Jason Elnor RDN,LDN    Virtual Visit No    Medication changes reported     No    Fall or balance concerns reported    No    Warm-up and Cool-down Performed on first and last piece of equipment    Resistance Training Performed Yes    VAD Patient? No    PAD/SET Patient? No      Pain Assessment   Currently in Pain? No/denies             Social History   Tobacco Use  Smoking Status Never  Smokeless Tobacco Never    Goals Met:  Proper associated with RPD/PD & O2 Sat Independence with exercise equipment Using PLB without cueing & demonstrates good technique Exercise tolerated well No report of concerns or symptoms today Strength training completed today  Goals Unmet:  Not Applicable  Comments: Pt able to follow exercise prescription today without complaint.  Will continue to monitor for progression.    Dr. Oneil Pinal is Medical Director for [facility] Cardiac Rehabilitation.  Dr. Fuad Aleskerov is Medical Director for [facility] Pulmonary Rehabilitation.

## 2024-06-04 ENCOUNTER — Encounter

## 2024-06-04 DIAGNOSIS — I5022 Chronic systolic (congestive) heart failure: Secondary | ICD-10-CM

## 2024-06-04 NOTE — Progress Notes (Signed)
 Daily Session Note  Patient Details  Name: Erin Middleton MRN: [id number] Date of Birth: 1945/09/14 Referring Provider:   Conrad Ports Pulmonary Rehab from 01/30/2024 in [facility] Cardiac and Pulmonary Rehab  Referring Provider Florencio Kava, MD    Encounter Date: 06/04/2024  Check In:  Session Check In - 06/04/24 0740       Check-In   Supervising physician immediately available to respond to emergencies See telemetry face sheet for immediately available ER MD    Location [facility]-Cardiac & Pulmonary Rehab    Staff Present Burnard Davenport [facility];Karna Serve PhD, RN,CNS,CEN;Joseph [facility] BS, Exercise Physiologist    Virtual Visit No    Medication changes reported     No    Fall or balance concerns reported    No    Warm-up and Cool-down Performed on first and last piece of equipment    Resistance Training Performed Yes    VAD Patient? No    PAD/SET Patient? No      Pain Assessment   Currently in Pain? No/denies             Social History   Tobacco Use  Smoking Status Never  Smokeless Tobacco Never    Goals Met:  Proper associated with RPD/PD & O2 Sat Independence with exercise equipment Using PLB without cueing & demonstrates good technique Exercise tolerated well No report of concerns or symptoms today Strength training completed today  Goals Unmet:  Not Applicable  Comments: Pt able to follow exercise prescription today without complaint.  Will continue to monitor for progression.    Dr. Oneil Pinal is Medical Director for [facility] Cardiac Rehabilitation.  Dr. Fuad Aleskerov is Medical Director for [facility] Pulmonary Rehabilitation.

## 2024-06-09 ENCOUNTER — Encounter

## 2024-06-09 DIAGNOSIS — I5022 Chronic systolic (congestive) heart failure: Secondary | ICD-10-CM

## 2024-06-09 NOTE — Progress Notes (Signed)
 Daily Session Note  Patient Details  Name: DEBARA KAMPHUIS MRN: [id number] Date of Birth: [birth year] Referring Provider:   Conrad Ports Pulmonary Rehab from 01/30/2024 in [facility] Cardiac and Pulmonary Rehab  Referring Provider Florencio Kava, MD    Encounter Date: 06/09/2024  Check In:  Session Check In - 06/09/24 0747       Check-In   Supervising physician immediately available to respond to emergencies See telemetry face sheet for immediately available ER MD    Location [facility]-Cardiac & Pulmonary Rehab    Staff Present Burnard Davenport RN,BSN,MPA;Joseph [facility] Dyane BS, ACSM CEP, Exercise Physiologist;Jason Elnor RDN,LDN    Virtual Visit No    Medication changes reported     No    Fall or balance concerns reported    No    Warm-up and Cool-down Performed on first and last piece of equipment    Resistance Training Performed Yes    VAD Patient? No    PAD/SET Patient? No      Pain Assessment   Currently in Pain? No/denies             Social History   Tobacco Use  Smoking Status Never  Smokeless Tobacco Never    Goals Met:  Proper associated with RPD/PD & O2 Sat Independence with exercise equipment Using PLB without cueing & demonstrates good technique Exercise tolerated well No report of concerns or symptoms today Strength training completed today  Goals Unmet:  Not Applicable  Comments: Pt able to follow exercise prescription today without complaint.  Will continue to monitor for progression.    Dr. Oneil Pinal is Medical Director for [facility] Cardiac Rehabilitation.  Dr. Fuad Aleskerov is Medical Director for [facility] Pulmonary Rehabilitation.

## 2024-06-11 ENCOUNTER — Encounter

## 2024-06-11 DIAGNOSIS — I5022 Chronic systolic (congestive) heart failure: Secondary | ICD-10-CM

## 2024-06-11 NOTE — Progress Notes (Signed)
 Daily Session Note  Patient Details  Name: Erin Middleton MRN: [id number] Date of Birth: 23-Jun-1946 Referring Provider:   Conrad Ports Pulmonary Rehab from 01/30/2024 in [facility] Cardiac and Pulmonary Rehab  Referring Provider Florencio Kava, MD    Encounter Date: 06/11/2024  Check In:  Session Check In - 06/11/24 0735       Check-In   Supervising physician immediately available to respond to emergencies See telemetry face sheet for immediately available ER MD    Location [facility]-Cardiac & Pulmonary Rehab    Staff Present Burnard Davenport RN,BSN,MPA;Joseph Rolinda RCP,RRT,BSRT;Margaret Best, MS, Exercise Physiologist;Noah Tickle, BS, Exercise Physiologist    Virtual Visit No    Medication changes reported     No    Fall or balance concerns reported    No    Warm-up and Cool-down Performed on first and last piece of equipment    Resistance Training Performed Yes    VAD Patient? No    PAD/SET Patient? No      Pain Assessment   Currently in Pain? No/denies             Social History   Tobacco Use  Smoking Status Never  Smokeless Tobacco Never    Goals Met:  Proper associated with RPD/PD & O2 Sat Independence with exercise equipment Using PLB without cueing & demonstrates good technique Exercise tolerated well No report of concerns or symptoms today Strength training completed today  Goals Unmet:  Not Applicable  Comments: Pt able to follow exercise prescription today without complaint.  Will continue to monitor for progression.    Dr. Oneil Pinal is Medical Director for [facility] Cardiac Rehabilitation.  Dr. Fuad Aleskerov is Medical Director for [facility] Pulmonary Rehabilitation.

## 2024-06-16 ENCOUNTER — Encounter

## 2024-06-16 DIAGNOSIS — I5022 Chronic systolic (congestive) heart failure: Secondary | ICD-10-CM | POA: Diagnosis not present

## 2024-06-16 NOTE — Progress Notes (Signed)
 Discharge Summary Patient: Erin Middleton  DOB: [birth year]  [name] graduated today from  rehab with 36 sessions completed.  Details of the patient's exercise prescription and what She needs to do in order to continue the prescription and progress were discussed with patient.  Patient was given a copy of prescription and goals.  Patient verbalized understanding. Elta plans to continue to exercise by walking at home.   6 Minute Walk     Row Name 01/30/24 1524 05/26/24 0759       6 Minute Walk   Phase Initial Discharge    Distance 830 feet 1050 feet    Distance % Change -- 27 %    Distance Feet Change -- 220 ft    Walk Time 6 minutes 6 minutes    # of Rest Breaks 0 0    MPH 1.57 2    METS 1.68 1.94    RPE 11 12    Perceived Dyspnea  1 1    VO2 Peak 5.89 6.79    Symptoms Yes (comment) No    Comments 6/10 back pain --    Resting HR 81 bpm 80 bpm    Resting BP 128/60 124/82    Resting Oxygen Saturation  96 % 96 %    Exercise Oxygen Saturation  during 6 min walk 96 % 97 %    Max Ex. HR 97 bpm 97 bpm    Max Ex. BP 160/80 142/60    2 Minute Post BP 138/72 130/70      Interval HR   1 Minute HR 89 88    2 Minute HR 95 92    3 Minute HR 97 94    4 Minute HR 94 92    5 Minute HR 94 97    6 Minute HR 97 85    2 Minute Post HR 80 81    Interval Heart Rate? Yes Yes      Interval Oxygen   Interval Oxygen? Yes Yes    Baseline Oxygen Saturation % 96 % 96 %    1 Minute Oxygen Saturation % 97 % 98 %    1 Minute Liters of Oxygen 0 L 0 L    2 Minute Oxygen Saturation % 97 % 97 %    2 Minute Liters of Oxygen 0 L 0 L    3 Minute Oxygen Saturation % 98 % 97 %    3 Minute Liters of Oxygen 0 L 0 L    4 Minute Oxygen Saturation % 96 % 98 %    4 Minute Liters of Oxygen 0 L 0 L    5 Minute Oxygen Saturation % 96 % 98 %    5 Minute Liters of Oxygen 0 L 0 L    6 Minute Oxygen Saturation % 96 % 98 %    6 Minute Liters of Oxygen 0 L 0 L    2 Minute Post Oxygen Saturation % 97 % 97 %    2  Minute Post Liters of Oxygen 0 L 0 L

## 2024-06-16 NOTE — Progress Notes (Signed)
 Pulmonary Individual Treatment Plan  Patient Details  Name: Erin Middleton MRN: [id number] Date of Birth: [birth year] Referring Provider:   [facility]rad [facility]s Pulmonary Rehab from 01/30/2024 in [facility] Cardiac and Pulmonary Rehab  Referring Provider Florencio Kava, MD    Initial Encounter Date:  Flowsheet Row Pulmonary Rehab from 01/30/2024 in [facility] Cardiac and Pulmonary Rehab  Date 01/30/24    Visit Diagnosis: Heart failure, chronic systolic (HCC)  Patient's Home Medications on Admission: Current Medications[1]  Past Medical History: Past Medical History:  Diagnosis Date   Arthritis    Breast cancer (HCC) 1985   left breast cancer - chemotherapy   Cancer (HCC)    Breast   Diabetes mellitus without complication (HCC)    GERD (gastroesophageal reflux disease)    Headache    migraines h/o   Hypertension    Macular degeneration of both eyes, unspecified type    pt states its pretty advanced.   Personal history of chemotherapy     Tobacco Use: Tobacco Use History[2]  Labs: Review Flowsheet  More data exists      Latest Ref Rng & Units 03/09/2020 08/03/2020 12/21/2020 05/30/2021 10/27/2021  Labs for ITP Cardiac and Pulmonary Rehab  Cholestrol 100 - 199 mg/dL - 879  - 887  -  LDL (calc) 0 - 99 mg/dL - 47  - 38  -  HDL-C >60 mg/dL - 57  - 60  -  Trlycerides 0 - 149 mg/dL - 81  - 67  -  Hemoglobin A1c 4.8 - 5.6 % 6.5  6.6  7.0  6.8  7.7      Pulmonary Assessment Scores:  Pulmonary Assessment Scores     Row Name 01/30/24 1534 05/26/24 1136 06/09/24 0823     ADL UCSD   ADL Phase Entry Exit Exit   SOB Score total 31 -- 43   Rest 1 -- 1   Walk 1 -- 2   Stairs 4 -- 3   Bath 0 -- 1   Dress 0 -- 1   Shop 3 -- 3     CAT Score   CAT Score 17 -- 17     mMRC Score   mMRC Score 3 1 --      UCSD: Self-administered rating of dyspnea associated with activities of daily living (ADLs) 6-point scale (0 = not at all to 5 = maximal or unable to do because of breathlessness)   Scoring Scores range from 0 to 120.  Minimally important difference is 5 units  CAT: CAT can identify the health impairment of COPD patients and is better correlated with disease progression.  CAT has a scoring range of zero to 40. The CAT score is classified into four groups of low (less than 10), medium (10 - 20), high (21-30) and very high (31-40) based on the impact level of disease on health status. A CAT score over 10 suggests significant symptoms.  A worsening CAT score could be explained by an exacerbation, poor medication adherence, poor inhaler technique, or progression of COPD or comorbid conditions.  CAT MCID is 2 points  mMRC: mMRC (Modified Medical Research Council) Dyspnea Scale is used to assess the degree of baseline functional disability in patients of respiratory disease due to dyspnea. No minimal important difference is established. A decrease in score of 1 point or greater is considered a positive change.   Pulmonary Function Assessment:   Exercise Target Goals: Exercise Program Goal: Individual exercise prescription set using results from initial 6 min walk  test and THRR while considering  patients activity barriers and safety.   Exercise Prescription Goal: Initial exercise prescription builds to 30-45 minutes a day of aerobic activity, 2-3 days per week.  Home exercise guidelines will be given to patient during program as part of exercise prescription that the participant will acknowledge.  Education: Aerobic Exercise: - Group verbal and visual presentation on the components of exercise prescription. Introduces F.I.T.T principle from ACSM for exercise prescriptions.  Reviews F.I.T.T. principles of aerobic exercise including progression. Written material provided at class time.   Education: Resistance Exercise: - Group verbal and visual presentation on the components of exercise prescription. Introduces F.I.T.T principle from ACSM for exercise prescriptions  Reviews  F.I.T.T. principles of resistance exercise including progression. Written material provided at class time.    Education: Exercise & Equipment Safety: - Individual verbal instruction and demonstration of equipment use and safety with use of the equipment. Flowsheet Row Pulmonary Rehab from 01/30/2024 in [facility] Cardiac and Pulmonary Rehab  Date 01/30/24  Educator MB  Instruction Review Code 1- Verbalizes Understanding    Education: Exercise Physiology & General Exercise Guidelines: - Group verbal and written instruction with models to review the exercise physiology of the cardiovascular system and associated critical values. Provides general exercise guidelines with specific guidelines to those with heart or lung disease.    Education: Flexibility, Balance, Mind/Body Relaxation: - Group verbal and visual presentation with interactive activity on the components of exercise prescription. Introduces F.I.T.T principle from ACSM for exercise prescriptions. Reviews F.I.T.T. principles of flexibility and balance exercise training including progression. Also discusses the mind body connection.  Reviews various relaxation techniques to help reduce and manage stress (i.e. Deep breathing, progressive muscle relaxation, and visualization). Balance handout provided to take home. Written material provided at class time.   Activity Barriers & Risk Stratification:  Activity Barriers & Cardiac Risk Stratification - 01/30/24 1526       Activity Barriers & Cardiac Risk Stratification   Activity Barriers Back Problems;Shortness of Breath;Balance Concerns;Left Knee Replacement          6 Minute Walk:  6 Minute Walk     Row Name 01/30/24 1524 05/26/24 0759       6 Minute Walk   Phase Initial Discharge    Distance 830 feet 1050 feet    Distance % Change -- 27 %    Distance Feet Change -- 220 ft    Walk Time 6 minutes 6 minutes    # of Rest Breaks 0 0    MPH 1.57 2    METS 1.68 1.94    RPE 11 12     Perceived Dyspnea  1 1    VO2 Peak 5.89 6.79    Symptoms Yes (comment) No    Comments 6/10 back pain --    Resting HR 81 bpm 80 bpm    Resting BP 128/60 124/82    Resting Oxygen Saturation  96 % 96 %    Exercise Oxygen Saturation  during 6 min walk 96 % 97 %    Max Ex. HR 97 bpm 97 bpm    Max Ex. BP 160/80 142/60    2 Minute Post BP 138/72 130/70      Interval HR   1 Minute HR 89 88    2 Minute HR 95 92    3 Minute HR 97 94    4 Minute HR 94 92    5 Minute HR 94 97    6 Minute HR  97 85    2 Minute Post HR 80 81    Interval Heart Rate? Yes Yes      Interval Oxygen   Interval Oxygen? Yes Yes    Baseline Oxygen Saturation % 96 % 96 %    1 Minute Oxygen Saturation % 97 % 98 %    1 Minute Liters of Oxygen 0 L 0 L    2 Minute Oxygen Saturation % 97 % 97 %    2 Minute Liters of Oxygen 0 L 0 L    3 Minute Oxygen Saturation % 98 % 97 %    3 Minute Liters of Oxygen 0 L 0 L    4 Minute Oxygen Saturation % 96 % 98 %    4 Minute Liters of Oxygen 0 L 0 L    5 Minute Oxygen Saturation % 96 % 98 %    5 Minute Liters of Oxygen 0 L 0 L    6 Minute Oxygen Saturation % 96 % 98 %    6 Minute Liters of Oxygen 0 L 0 L    2 Minute Post Oxygen Saturation % 97 % 97 %    2 Minute Post Liters of Oxygen 0 L 0 L      Oxygen Initial Assessment:  Oxygen Initial Assessment - 01/23/24 1108       Home Oxygen   Home Oxygen Device None    Sleep Oxygen Prescription None    Home Exercise Oxygen Prescription None    Home Resting Oxygen Prescription None    Compliance with Home Oxygen Use Yes      Intervention   Short Term Goals To learn and understand importance of maintaining oxygen saturations>88%;To learn and demonstrate proper pursed lip breathing techniques or other breathing techniques. ;To learn and demonstrate proper use of respiratory medications;To learn and understand importance of monitoring SPO2 with pulse oximeter and demonstrate accurate use of the pulse oximeter.    Long  Term Goals  Verbalizes importance of monitoring SPO2 with pulse oximeter and return demonstration;Maintenance of O2 saturations>88%;Exhibits proper breathing techniques, such as pursed lip breathing or other method taught during program session;Compliance with respiratory medication          Oxygen Re-Evaluation:  Oxygen Re-Evaluation     Row Name 02/06/24 9191 04/02/24 0747 05/12/24 0743 06/04/24 0746       Program Oxygen Prescription   Program Oxygen Prescription -- None None None      Home Oxygen   Home Oxygen Device None None None None    Sleep Oxygen Prescription None CPAP CPAP CPAP    Liters per minute -- 0 0 0    Home Exercise Oxygen Prescription None None None None    Home Resting Oxygen Prescription None None None None    Compliance with Home Oxygen Use Yes Yes Yes Yes      Goals/Expected Outcomes   Short Term Goals To learn and understand importance of maintaining oxygen saturations>88%;To learn and demonstrate proper pursed lip breathing techniques or other breathing techniques. ;To learn and demonstrate proper use of respiratory medications;To learn and understand importance of monitoring SPO2 with pulse oximeter and demonstrate accurate use of the pulse oximeter. To learn and demonstrate proper pursed lip breathing techniques or other breathing techniques.  To learn and understand importance of maintaining oxygen saturations>88%;To learn and understand importance of monitoring SPO2 with pulse oximeter and demonstrate accurate use of the pulse oximeter. Other    Long  Term Goals Verbalizes importance  of monitoring SPO2 with pulse oximeter and return demonstration;Maintenance of O2 saturations>88%;Exhibits proper breathing techniques, such as pursed lip breathing or other method taught during program session;Compliance with respiratory medication Exhibits proper breathing techniques, such as pursed lip breathing or other method taught during program session Maintenance of O2  saturations>88%;Verbalizes importance of monitoring SPO2 with pulse oximeter and return demonstration Other    Comments Reviewed PLB technique with pt.  Talked about how it works and it's importance in maintaining their exercise saturations. Informed patient how to perform the Pursed Lipped breathing technique. Told patient to Inhale through the nose and out the mouth with pursed lips to keep their airways open, help oxygenate them better, practice when at rest or doing strenuous activity. Patient Verbalizes understanding of technique and will work on and be reiterated during [facility]. Patient has a pulse oximeter to check her oxygen saturation at home. Informed and explained why it is important to have one. Reviewed that oxygen saturations should be 88 percent and above. Erin Middleton has been checking her puls ox at home and understands that her oxygen needs to be 88 percent and above. Informed her to check her oxygen when she exercises at home.    Goals/Expected Outcomes Short: Become more profiecient at using PLB. Long: Become independent at using PLB. Short: use PLB with exertion. Long: use PLB on exertion proficiently and independently. Short: monitor oxygen at home with exertion. Long: maintain oxygen saturations above 88 percent independently. Short: Graduate [facility]. Long: continue to exercise at home.       Oxygen Discharge (Final Oxygen Re-Evaluation):  Oxygen Re-Evaluation - 06/04/24 0746       Program Oxygen Prescription   Program Oxygen Prescription None      Home Oxygen   Home Oxygen Device None    Sleep Oxygen Prescription CPAP    Liters per minute 0    Home Exercise Oxygen Prescription None    Home Resting Oxygen Prescription None    Compliance with Home Oxygen Use Yes      Goals/Expected Outcomes   Short Term Goals Other    Long  Term Goals Other    Comments Erin Middleton has been checking her puls ox at home and understands that her oxygen needs to be 88 percent and above. Informed her to  check her oxygen when she exercises at home.    Goals/Expected Outcomes Short: Graduate [facility]. Long: continue to exercise at home.          Initial Exercise Prescription:  Initial Exercise Prescription - 01/30/24 1500       Date of Initial Exercise RX and Referring Provider   Date 01/30/24    Referring Provider Florencio Kava, MD      Oxygen   Maintain Oxygen Saturation 88% or higher      Recumbant Bike   Level 1    RPM 50    Watts 15    Minutes 15    METs 1.68      NuStep   Level 1    SPM 80    Minutes 15    METs 1.68      Biostep-RELP   Level 1    SPM 50    Minutes 15    METs 1.68      Track   Laps 20   Hallway   Minutes 15    METs 2.09      Prescription Details   Frequency (times per week) 2    Duration Progress to 30 minutes of  continuous aerobic without signs/symptoms of physical distress      Intensity   THRR 40-80% of Max Heartrate 104-128    Ratings of Perceived Exertion 11-13    Perceived Dyspnea 0-4      Progression   Progression Continue to progress workloads to maintain intensity without signs/symptoms of physical distress.      Resistance Training   Training Prescription Yes    Weight 3 lb    Reps 10-15          Perform Capillary Blood Glucose checks as needed.  Exercise Prescription Changes:   Exercise Prescription Changes     Row Name 01/30/24 1500 02/22/24 0800 03/05/24 1500 03/10/24 0800 03/20/24 0800     Response to Exercise   Blood Pressure (Admit) 128/60 122/62 104/62 -- 118/64   Blood Pressure (Exercise) 160/80 142/68 140/82 -- 136/70   Blood Pressure (Exit) 138/72 126/62 104/62 -- 122/64   Heart Rate (Admit) 81 bpm 80 bpm 80 bpm -- 81 bpm   Heart Rate (Exercise) 97 bpm 83 bpm 84 bpm -- 82 bpm   Heart Rate (Exit) 80 bpm 80 bpm 80 bpm -- 82 bpm   Oxygen Saturation (Admit) 96 % 97 % 96 % -- 96 %   Oxygen Saturation (Exercise) 98 % 96 % 95 % -- 95 %   Oxygen Saturation (Exit) 97 % 97 % 98 % -- 97 %   Rating of  Perceived Exertion (Exercise) 11 13 13  -- 14   Perceived Dyspnea (Exercise) 1 1 2  -- 1   Symptoms 6/10 back pain -- none -- none   Comments results First three days of exercise -- -- --   Duration -- Progress to 30 minutes of  aerobic without signs/symptoms of physical distress Progress to 30 minutes of  aerobic without signs/symptoms of physical distress Progress to 30 minutes of  aerobic without signs/symptoms of physical distress Progress to 30 minutes of  aerobic without signs/symptoms of physical distress   Intensity -- THRR unchanged THRR unchanged THRR unchanged THRR unchanged     Progression   Progression -- Continue to progress workloads to maintain intensity without signs/symptoms of physical distress. Continue to progress workloads to maintain intensity without signs/symptoms of physical distress. Continue to progress workloads to maintain intensity without signs/symptoms of physical distress. Continue to progress workloads to maintain intensity without signs/symptoms of physical distress.   Average METs 1.68 1.82 1.65 1.65 1.87     Resistance Training   Training Prescription -- Yes Yes Yes Yes   Weight -- 3 lb 3 lb 3 lb 3 lb   Reps -- 10-15 10-15 10-15 10-15     Interval Training   Interval Training -- No No No No     Recumbant Bike   Level -- 1 -- -- 2   Watts -- 15 -- -- 16   Minutes -- 15 -- -- 15   METs -- 2.76 -- -- 2.75     NuStep   Level -- 1 5 5 4    Minutes -- 15 30 30 30    METs -- 1.5 1.8 1.8 1.7     Biostep-RELP   Level -- 1 -- -- 1   Minutes -- 30 -- -- 15   METs -- 2 -- -- 2     Track   Laps -- 6 8 8 8    Minutes -- 15 15 15 15    METs -- 1.33 1.44 1.44 1.44     Home Exercise Plan  Plans to continue exercise at -- -- -- Home (comment)  walking Home (comment)  walking   Frequency -- -- -- Add 2 additional days to program exercise sessions. Add 2 additional days to program exercise sessions.   Initial Home Exercises Provided -- -- -- 03/10/24  03/10/24     Oxygen   Maintain Oxygen Saturation -- 88% or higher 88% or higher 88% or higher 88% or higher    Row Name 04/03/24 1700 04/17/24 1000 04/28/24 1100 05/12/24 1100 05/27/24 1300     Response to Exercise   Blood Pressure (Admit) 138/76 126/58 120/62 122/66 116/70   Blood Pressure (Exit) 108/62 106/60 106/68 110/66 110/64   Heart Rate (Admit) 81 bpm 86 bpm 81 bpm 80 bpm 85 bpm   Heart Rate (Exercise) 84 bpm 80 bpm 99 bpm 85 bpm 90 bpm   Heart Rate (Exit) 80 bpm 81 bpm 80 bpm 80 bpm 80 bpm   Oxygen Saturation (Admit) 97 % 95 % 98 % 97 % 96 %   Oxygen Saturation (Exercise) 96 % 96 % 95 % 95 % 95 %   Oxygen Saturation (Exit) 98 % 98 % 98 % 97 % 97 %   Rating of Perceived Exertion (Exercise) 13 15 14 14 15    Perceived Dyspnea (Exercise) 1 1 0 2 1   Symptoms none none none none none   Duration Progress to 30 minutes of  aerobic without signs/symptoms of physical distress Progress to 30 minutes of  aerobic without signs/symptoms of physical distress Progress to 30 minutes of  aerobic without signs/symptoms of physical distress Progress to 30 minutes of  aerobic without signs/symptoms of physical distress Continue with 30 min of aerobic exercise without signs/symptoms of physical distress.   Intensity THRR unchanged THRR unchanged THRR unchanged THRR unchanged THRR unchanged     Progression   Progression Continue to progress workloads to maintain intensity without signs/symptoms of physical distress. Continue to progress workloads to maintain intensity without signs/symptoms of physical distress. Continue to progress workloads to maintain intensity without signs/symptoms of physical distress. Continue to progress workloads to maintain intensity without signs/symptoms of physical distress. Continue to progress workloads to maintain intensity without signs/symptoms of physical distress.   Average METs 1.94 1.7 1.85 1.77 1.66     Resistance Training   Training Prescription Yes Yes Yes Yes  Yes   Weight 3 lb 3 lb 3 lb 3 lb 3 lb   Reps 10-15 10-15 10-15 10-15 10-15     Interval Training   Interval Training No No No No No     Recumbant Bike   Level 2 -- -- -- --   Watts 15 -- -- -- --   Minutes 15 -- -- -- --   METs 2.76 -- -- -- --     NuStep   Level 2 5 4 4 5    Minutes 30 15 15 15 15    METs 2 1.8 2 2 2      Biostep-RELP   Level 1 1 2 2 1    Minutes 30 15 15 15 15    METs 1 2 2 2 1      Track   Laps -- 8  Hall 22 17  Hall 18  hallway   Minutes -- 15 15 15 15    METs -- 1.55 2.2 1.92 1.78     Home Exercise Plan   Plans to continue exercise at Home (comment)  walking Home (comment)  walking Home (comment)  walking Home (comment)  walking Home (comment)  walking   Frequency Add 2 additional days to program exercise sessions. Add 2 additional days to program exercise sessions. Add 2 additional days to program exercise sessions. Add 2 additional days to program exercise sessions. Add 2 additional days to program exercise sessions.   Initial Home Exercises Provided 03/10/24 03/10/24 03/10/24 03/10/24 03/10/24     Oxygen   Maintain Oxygen Saturation 88% or higher 88% or higher 88% or higher 88% or higher 88% or higher    Row Name 06/10/24 1500             Response to Exercise   Blood Pressure (Admit) 118/78       Blood Pressure (Exit) 104/60       Heart Rate (Admit) 84 bpm       Heart Rate (Exercise) 93 bpm       Heart Rate (Exit) 82 bpm       Oxygen Saturation (Admit) 84 %       Oxygen Saturation (Exercise) 97 %       Oxygen Saturation (Exit) 95 %       Rating of Perceived Exertion (Exercise) 96       Perceived Dyspnea (Exercise) 1       Symptoms none       Duration Continue with 30 min of aerobic exercise without signs/symptoms of physical distress.       Intensity THRR unchanged         Progression   Progression Continue to progress workloads to maintain intensity without signs/symptoms of physical distress.       Average METs 1.79         Resistance  Training   Training Prescription Yes       Weight 3 lb       Reps 10-15         Interval Training   Interval Training No         NuStep   Level 4       Minutes 15       METs 1.5         Biostep-RELP   Level 1       Minutes 15       METs 1.5         Track   Laps 14       Minutes 15       METs 1.76         Home Exercise Plan   Plans to continue exercise at Home (comment)  walking       Frequency Add 2 additional days to program exercise sessions.       Initial Home Exercises Provided 03/10/24         Oxygen   Maintain Oxygen Saturation 88% or higher          Exercise Comments:   Exercise Comments     Row Name 02/06/24 0807 06/16/24 0818         Exercise Comments First full day of exercise!  Patient was oriented to gym and equipment including functions, settings, policies, and procedures.  Patient's individual exercise prescription and treatment plan were reviewed.  All starting workloads were established based on the results of the 6 minute walk test done at initial orientation visit.  The plan for exercise progression was also introduced and progression will be customized based on patient's performance and goals. Erin Middleton graduated today from  rehab with 36 sessions completed.  Details of the patient's exercise  prescription and what She needs to do in order to continue the prescription and progress were discussed with patient.  Patient was given a copy of prescription and goals.  Patient verbalized understanding. Erin Middleton plans to continue to exercise by walking at home.         Exercise Goals and Review:   Exercise Goals     Row Name 01/30/24 1532             Exercise Goals   Increase Physical Activity Yes       Intervention Provide advice, education, support and counseling about physical activity/exercise needs.;Develop an individualized exercise prescription for aerobic and resistive training based on initial evaluation findings, risk stratification,  comorbidities and participant's personal goals.       Expected Outcomes Short Term: Attend rehab on a regular basis to increase amount of physical activity.;Long Term: Add in home exercise to make exercise part of routine and to increase amount of physical activity.;Long Term: Exercising regularly at least 3-5 days a week.       Increase Strength and Stamina Yes       Intervention Provide advice, education, support and counseling about physical activity/exercise needs.;Develop an individualized exercise prescription for aerobic and resistive training based on initial evaluation findings, risk stratification, comorbidities and participant's personal goals.       Expected Outcomes Short Term: Increase workloads from initial exercise prescription for resistance, speed, and METs.;Short Term: Perform resistance training exercises routinely during rehab and add in resistance training at home;Long Term: Improve cardiorespiratory fitness, muscular endurance and strength as measured by increased METs and functional capacity ( )       Able to understand and use rate of perceived exertion (RPE) scale Yes       Intervention Provide education and explanation on how to use RPE scale       Expected Outcomes Short Term: Able to use RPE daily in rehab to express subjective intensity level;Long Term:  Able to use RPE to guide intensity level when exercising independently       Able to understand and use Dyspnea scale Yes       Intervention Provide education and explanation on how to use Dyspnea scale       Expected Outcomes Short Term: Able to use Dyspnea scale daily in rehab to express subjective sense of shortness of breath during exertion;Long Term: Able to use Dyspnea scale to guide intensity level when exercising independently       Knowledge and understanding of Target Heart Rate Range (THRR) Yes       Intervention Provide education and explanation of THRR including how the numbers were predicted and where they  are located for reference       Expected Outcomes Short Term: Able to state/look up THRR;Short Term: Able to use daily as guideline for intensity in rehab;Long Term: Able to use THRR to govern intensity when exercising independently       Able to check pulse independently Yes       Intervention Provide education and demonstration on how to check pulse in carotid and radial arteries.;Review the importance of being able to check your own pulse for safety during independent exercise       Expected Outcomes Long Term: Able to check pulse independently and accurately;Short Term: Able to explain why pulse checking is important during independent exercise       Understanding of Exercise Prescription Yes       Intervention Provide education, explanation, and written materials  on patient's individual exercise prescription       Expected Outcomes Short Term: Able to explain program exercise prescription;Long Term: Able to explain home exercise prescription to exercise independently          Exercise Goals Re-Evaluation :  Exercise Goals Re-Evaluation     Row Name 02/06/24 [phone] 02/22/24 0852 03/05/24 1546 03/10/24 0816 03/20/24 0856     Exercise Goal Re-Evaluation   Exercise Goals Review Increase Physical Activity;Able to understand and use rate of perceived exertion (RPE) scale;Knowledge and understanding of Target Heart Rate Range (THRR);Understanding of Exercise Prescription;Increase Strength and Stamina;Able to understand and use Dyspnea scale;Able to check pulse independently Increase Physical Activity;Increase Strength and Stamina;Understanding of Exercise Prescription Increase Physical Activity;Increase Strength and Stamina;Understanding of Exercise Prescription Increase Physical Activity;Able to understand and use rate of perceived exertion (RPE) scale;Knowledge and understanding of Target Heart Rate Range (THRR);Understanding of Exercise Prescription;Increase Strength and Stamina;Able to understand  and use Dyspnea scale;Able to check pulse independently Increase Physical Activity;Understanding of Exercise Prescription;Increase Strength and Stamina   Comments Reviewed RPE and dyspnea scale, THR and program prescription with pt today.  Pt voiced understanding and was given a copy of goals to take home. Erin Middleton is off to a good start in rehab. She did well at level 1 on the T4 nustep, biostep, and the recumbent bike. She also did well walking six laps on the track. We will continue to monitor her progress in the program. Erin Middleton is off to a good start in rehab. She increased to level 5 on the T4 nustep. She increased her laps on the track to 8 laps. We will continue to monitor her progress in the program. Reviewed home exercise with pt today from 7:50 am to 8:00am.  Pt plans to walk for exercise.  Reviewed THR, pulse, RPE, sign and symptoms, pulse oximetery and when to call 911 or MD.  Also discussed weather considerations and indoor options.  Pt voiced understanding. Erin Middleton is doing well in rehab. She increased to level 2 on the recumbent bike. She maintained 8 laps on the track and level 1 on the biostep. She decreased down slightly to level 4 on the T4 nustep for 30 minutes. We will continue to monitor her progress in the program.   Expected Outcomes Short: Use RPE daily to regulate intensity. Long: Follow program prescription in THR. -- Short: Continue to progressively increase T4 nustep workload and track laps. Long: Continue exercise to improve strength and stamina. Short: add 1-2 days a week of exercise on off days of rehab. Long: maintain independent exercise routine upon graduation from program. Short: Try for more laps on the track and increase back to level 5 on the T4 nustep. Long: Continue exercise to improve strength and stamina.    Row Name 04/03/24 1713 04/17/24 1041 04/28/24 1127 05/12/24 1135 05/12/24 1136     Exercise Goal Re-Evaluation   Exercise Goals Review Increase Physical  Activity;Understanding of Exercise Prescription;Increase Strength and Stamina Increase Physical Activity;Understanding of Exercise Prescription;Increase Strength and Stamina Increase Physical Activity;Understanding of Exercise Prescription;Increase Strength and Stamina Increase Physical Activity;Understanding of Exercise Prescription;Increase Strength and Stamina Increase Physical Activity;Understanding of Exercise Prescription;Increase Strength and Stamina   Comments Erin Middleton is doing well in rehab. She maintained level 2 on the recumbent bike and level 1 on the biostep. She decreased her level to 2 on the T4 nustep, which was previously level 4. We will continue to monitor her progress in the program. Erin Middleton continues to do well in  rehab. She was recently able to increase to level 5 on the T4 nustep. She was also able to maintain 8 laps in the hallway. We will continue to monitor her progress in the program. Erin Middleton is doing well in rehab. She was recently able to increase her track laps from 8 to 22, as well as increase from level 1 to 2 on the biostep. We will continue to monitor her progress in the program. Erin Middleton continues to do well in rehab. She has been able to maintain level 2 on the Erin Middleton continues to do well in rehab. She has been able to maintain level 2 on the biostep, and level 4 on the T4 nustep. She also walked 17 laps in the hallway. We will continue to moniotr her progress in the program.   Expected Outcomes Short: Increase back to level 4 on the T4 nustep. Long: Continue exercise to improve strength and stamina. Short: Continue to increase track laps, return to using the recumbent bike. Long: Continue exercise to improve strength and stamina. Short: Continue to increase track laps and level on the biostep. Long: Continue exercise to improve strength and stamina. -- Short: Continue to push for more track laps. Long: Continue exercise to improve strength and stamina.    Row Name 05/26/24 0803 05/27/24 1347  06/10/24 1513         Exercise Goal Re-Evaluation   Exercise Goals Review Increase Physical Activity;Increase Strength and Stamina;Understanding of Exercise Prescription Increase Physical Activity;Increase Strength and Stamina;Understanding of Exercise Prescription Increase Physical Activity;Increase Strength and Stamina;Understanding of Exercise Prescription     Comments Erin Middleton improved by 27% on her 6 MWT. She plans to walk, use hand weights, and stretch at home for her exercise upon graduation from pulmonary rehab. Erin Middleton continues to do well and is close to graduating from the program. She recently increased her laps on the track back up to 18 laps. She also improved back to level 5 on the T4 nustep. We will continue to monitor her progress in the program. Erin Middleton continues to do well in rehab and will graduate in the next few sessions. She walked 14 laps on the track. She worked at level 4 on the T4 nustep and level 1 on the biostep. We will continue to monitor her progress in the program until graduation.     Expected Outcomes Short: graduate from pulmonary rehab. Long: maintain independent exercise routine. Short: Graduate. Long: Continue to exercise independently Short: Graduate. Long: Continue to exercise independently        Discharge Exercise Prescription (Final Exercise Prescription Changes):  Exercise Prescription Changes - 06/10/24 1500       Response to Exercise   Blood Pressure (Admit) 118/78    Blood Pressure (Exit) 104/60    Heart Rate (Admit) 84 bpm    Heart Rate (Exercise) 93 bpm    Heart Rate (Exit) 82 bpm    Oxygen Saturation (Admit) 84 %    Oxygen Saturation (Exercise) 97 %    Oxygen Saturation (Exit) 95 %    Rating of Perceived Exertion (Exercise) 96    Perceived Dyspnea (Exercise) 1    Symptoms none    Duration Continue with 30 min of aerobic exercise without signs/symptoms of physical distress.    Intensity THRR unchanged      Progression   Progression Continue to  progress workloads to maintain intensity without signs/symptoms of physical distress.    Average METs 1.79      Resistance Training   Training Prescription  Yes    Weight 3 lb    Reps 10-15      Interval Training   Interval Training No      NuStep   Level 4    Minutes 15    METs 1.5      Biostep-RELP   Level 1    Minutes 15    METs 1.5      Track   Laps 14    Minutes 15    METs 1.76      Home Exercise Plan   Plans to continue exercise at Home (comment)   walking   Frequency Add 2 additional days to program exercise sessions.    Initial Home Exercises Provided 03/10/24      Oxygen   Maintain Oxygen Saturation 88% or higher          Nutrition:  Target Goals: Understanding of nutrition guidelines, daily intake of sodium 1500mg , cholesterol 200mg , calories 30% from fat and 7% or less from saturated fats, daily to have 5 or more servings of fruits and vegetables.  Education: Nutrition 1 -Group instruction provided by verbal, written material, interactive activities, discussions, models, and posters to present general guidelines for heart healthy nutrition including macronutrients, label reading, and promoting whole foods over processed counterparts. Education serves as pensions consultant of discussion of heart healthy eating for all. Written material provided at class time.     Education: Nutrition 2 -Group instruction provided by verbal, written material, interactive activities, discussions, models, and posters to present general guidelines for heart healthy nutrition including sodium, cholesterol, and saturated fat. Providing guidance of habit forming to improve blood pressure, cholesterol, and body weight. Written material provided at class time.     Biometrics:  Pre Biometrics - 01/30/24 1533       Pre Biometrics   Height 5' 1.3 (1.557 m)    Weight 138 lb (62.6 kg)    Waist Circumference 27.5 inches    Hip Circumference 42.8 inches    Waist to Hip Ratio 0.64 %     BMI (Calculated) 25.82    Single Leg Stand 4 seconds          Post Biometrics - 05/26/24 0804        Post  Biometrics   Height 5' 1.3 (1.557 m)    Weight 137 lb 9.6 oz (62.4 kg)    Waist Circumference 36.5 inches    Hip Circumference 41 inches    Waist to Hip Ratio 0.89 %    BMI (Calculated) 25.75    Single Leg Stand 4.1 seconds          Nutrition Therapy Plan and Nutrition Goals:  Nutrition Therapy & Goals - 01/30/24 1535       Nutrition Therapy   RD appointment deferred Yes      Personal Nutrition Goals   Nutrition Goal RD appointment deferred at this time      Intervention Plan   Intervention Prescribe, educate and counsel regarding individualized specific dietary modifications aiming towards targeted core components such as weight, hypertension, lipid management, diabetes, heart failure and other comorbidities.    Expected Outcomes Short Term Goal: Understand basic principles of dietary content, such as calories, fat, sodium, cholesterol and nutrients.          Nutrition Assessments:  MEDIFICTS Score Key: >=70 Need to make dietary changes  40-70 Heart Healthy Diet <= 40 Therapeutic Level Cholesterol Diet  Flowsheet Row Pulmonary Rehab from 06/09/2024 in [facility]  Picture  Your Plate Total Score on Admission 53  Picture Your Plate Total Score on Discharge 45   Picture Your Plate Scores: <59 Unhealthy dietary pattern with much room for improvement. 41-50 Dietary pattern unlikely to meet recommendations for good health and room for improvement. 51-60 More healthful dietary pattern, with some room for improvement.  >60 Healthy dietary pattern, although there may be some specific behaviors that could be improved.   Nutrition Goals Re-Evaluation:  Nutrition Goals Re-Evaluation     Row Name 03/26/24 [phone] 04/02/24 0750 05/12/24 0747 06/04/24 0745       Goals   Nutrition Goal RD appointment deferred at this time -- RD appointment  deferred at this time RD appointment deferred at this time    Comment -- Patient was informed on why it is important to maintain a balanced diet when dealing with Respiratory issues. Explained that it takes a lot of energy to breath and when they are short of breath often they will need to have a good diet to help keep up with the calories they are expending for breathing. -- --    Expected Outcome -- Short: Choose and plan snacks accordingly to patients caloric intake to improve breathing. Long: Maintain a diet independently that meets their caloric intake to aid in daily shortness of breath. -- --       Nutrition Goals Discharge (Final Nutrition Goals Re-Evaluation):  Nutrition Goals Re-Evaluation - 06/04/24 0745       Goals   Nutrition Goal RD appointment deferred at this time          Psychosocial: Target Goals: Acknowledge presence or absence of significant depression and/or stress, maximize coping skills, provide positive support system. Participant is able to verbalize types and ability to use techniques and skills needed for reducing stress and depression.   Education: Stress, Anxiety, and Depression - Group verbal and visual presentation to define topics covered.  Reviews how body is impacted by stress, anxiety, and depression.  Also discusses healthy ways to reduce stress and to treat/manage anxiety and depression.  Written material provided at class time.   Education: Sleep Hygiene -Provides group verbal and written instruction about how sleep can affect your health.  Define sleep hygiene, discuss sleep cycles and impact of sleep habits. Review good sleep hygiene tips.    Initial Review & Psychosocial Screening:  Initial Psych Review & Screening - 01/23/24 1046       Initial Review   Current issues with None Identified      Family Dynamics   Good Support System? Yes      Barriers   Psychosocial barriers to participate in program There are no identifiable barriers or  psychosocial needs.;The patient should benefit from training in stress management and relaxation.      Screening Interventions   Interventions Encouraged to exercise;To provide support and resources with identified psychosocial needs    Expected Outcomes Short Term goal: Utilizing psychosocial counselor, staff and physician to assist with identification of specific Stressors or current issues interfering with healing process. Setting desired goal for each stressor or current issue identified.;Long Term Goal: Stressors or current issues are controlled or eliminated.;Short Term goal: Identification and review with participant of any Quality of Life or Depression concerns found by scoring the questionnaire.;Long Term goal: The participant improves quality of Life and PHQ9 Scores as seen by post scores and/or verbalization of changes          Quality of Life Scores:  Scores of 19 and below  usually indicate a poorer quality of life in these areas.  A difference of  2-3 points is a clinically meaningful difference.  A difference of 2-3 points in the total score of the Quality of Life Index has been associated with significant improvement in overall quality of life, self-image, physical symptoms, and general health in studies assessing change in quality of life.  PHQ-9: Review Flowsheet  More data exists      06/09/2024 05/12/2024 03/26/2024 01/30/2024 10/27/2021  Depression screen PHQ 2/9  Decreased Interest 1 1 0 1 1  Down, Depressed, Hopeless 1 0 1 0 1  PHQ - 2 Score 2 1 1 1 2   Altered sleeping 2 0 0 1 1  Tired, decreased energy 2 1 1 1 1   Change in appetite 1 1 0 1 0  Feeling bad or failure about yourself  1 0 1 0 0  Trouble concentrating 1 0 1 1 1   Moving slowly or fidgety/restless 1 1 1 1  0  Suicidal thoughts 0 0 0 0 0  PHQ-9 Score 10 4 5  6  5    Difficult doing work/chores Somewhat difficult Somewhat difficult Not difficult at all Somewhat difficult Somewhat difficult    Details        Data saved with a previous flowsheet row definition        Interpretation of Total Score  Total Score Depression Severity:  1-4 = Minimal depression, 5-9 = Mild depression, 10-14 = Moderate depression, 15-19 = Moderately severe depression, 20-27 = Severe depression   Psychosocial Evaluation and Intervention:  Psychosocial Evaluation - 01/23/24 1058       Psychosocial Evaluation & Interventions   Comments Erin Middleton is coming to the program with systolic heart failure. She notes her shortness of breath has been her main issue, but can tell a difference with her medication now. She has issue with  her vision, so she no longer drives and relies on her daughter for transportation. When asked about stress concerns, she mentions currently she feels like she is doing okay. She is looking forward to attending the program to work on her stamina and strength    Expected Outcomes Short: attend the program for education and exercise Long: develop and maintain positive self care habits.    Continue Psychosocial Services  Follow up required by staff          Psychosocial Re-Evaluation:  Psychosocial Re-Evaluation     Row Name 03/26/24 0850 04/02/24 0751 05/12/24 0750 06/04/24 0745       Psychosocial Re-Evaluation   Current issues with None Identified Current Stress Concerns Current Stress Concerns --    Comments Erin Middleton denies any stress, depression, or anxiety at this time. She is sleeping good with an average of 6-7 hours a night, which is normal for her. She has a good support system. Her PHQ was reasses to a score of 5 today. Reviewed patient health questionnaire (PHQ-9) with patient for follow up. Previously, patients score indicated signs/symptoms of depression.  Reviewed to see if patient is improving symptom wise while in program.  Score declined and patient states that it is because she has a little more stamina. Reviewed patient health questionnaire (PHQ-9) with patient for follow up. Previously,  patients score indicated signs/symptoms of depression.  Reviewed to see if patient is improving symptom wise while in program.  Score improved and patient states that it is because she is able to do a little more. Erin Middleton is graduating the program soon, states no mental  instability and plans to keep working out at home.    Expected Outcomes Short: Attend rehab for exercise benefits on stress management. Long: Continue a positive outlook on life. Short: Continue to attend [facility] regularly for regular exercise and social engagement. Long: Continue to improve symptoms and manage a positive mental state. Short: Continue to attend [facility] regularly for regular exercise and social engagement. Long: Continue to improve symptoms and manage a positive mental state. --    Interventions Encouraged to attend Cardiac Rehabilitation for the exercise Encouraged to attend Pulmonary Rehabilitation for the exercise Encouraged to attend Pulmonary Rehabilitation for the exercise Encouraged to attend Cardiac Rehabilitation for the exercise    Continue Psychosocial Services  Follow up required by staff Follow up required by staff Follow up required by staff No Follow up required       Psychosocial Discharge (Final Psychosocial Re-Evaluation):  Psychosocial Re-Evaluation - 06/04/24 0745       Psychosocial Re-Evaluation   Comments Erin Middleton is graduating the program soon, states no mental instability and plans to keep working out at home.    Interventions Encouraged to attend Cardiac Rehabilitation for the exercise    Continue Psychosocial Services  No Follow up required          Education: Education Goals: Education classes will be provided on a weekly basis, covering required topics. Participant will state understanding/return demonstration of topics presented.  Learning Barriers/Preferences:  Learning Barriers/Preferences - 01/23/24 1046       Learning Barriers/Preferences   Learning Barriers Sight    Learning  Preferences None          General Pulmonary Education Topics:  Infection Prevention: - Provides verbal and written material to individual with discussion of infection control including proper hand washing and proper equipment cleaning during exercise session. Flowsheet Row Pulmonary Rehab from 01/30/2024 in [facility]  Date 01/30/24  Educator MB  Instruction Review Code 1- Verbalizes Understanding    Falls Prevention: - Provides verbal and written material to individual with discussion of falls prevention and safety. Flowsheet Row Pulmonary Rehab from 01/30/2024 in [facility]  Date 01/30/24  Educator MB  Instruction Review Code 1- Verbalizes Understanding    Chronic Lung Disease Review: - Group verbal instruction with posters, models, PowerPoint presentations and videos,  to review new updates, new respiratory medications, new advancements in procedures and treatments. Providing information on websites and 800 numbers for continued self-education. Includes information about supplement oxygen, available portable oxygen systems, continuous and intermittent flow rates, oxygen safety, concentrators, and Medicare reimbursement for oxygen. Explanation of Pulmonary Drugs, including class, frequency, complications, importance of spacers, rinsing mouth after steroid MDI's, and proper cleaning methods for nebulizers. Review of basic lung anatomy and physiology related to function, structure, and complications of lung disease. Review of risk factors. Discussion about methods for diagnosing sleep apnea and types of masks and machines for OSA. Includes a review of the use of types of environmental controls: home humidity, furnaces, filters, dust mite/pet prevention, HEPA vacuums. Discussion about weather changes, air quality and the benefits of nasal washing. Instruction on Warning signs, infection symptoms, calling MD promptly, preventive modes, and value of  vaccinations. Review of effective airway clearance, coughing and/or vibration techniques. Emphasizing that all should Create an Action Plan. Written material provided at class time.   AED/CPR: - Group verbal and written instruction with the use of models to demonstrate the basic use of the AED with the basic ABC's of resuscitation.  Tests and Procedures:  - Group verbal and visual presentation and models provide information about basic cardiac anatomy and function. Reviews the testing methods done to diagnose heart disease and the outcomes of the test results. Describes the treatment choices: Medical Management, Angioplasty, or Coronary Bypass Surgery for treating various heart conditions including Myocardial Infarction, Angina, Valve Disease, and Cardiac Arrhythmias.  Written material provided at class time.   Medication Safety: - Group verbal and visual instruction to review commonly prescribed medications for heart and lung disease. Reviews the medication, class of the drug, and side effects. Includes the steps to properly store meds and maintain the prescription regimen.  Written material given at graduation.   Other: -Provides group and verbal instruction on various topics (see comments)   Knowledge Questionnaire Score:  Knowledge Questionnaire Score - 06/09/24 0825       Knowledge Questionnaire Score   Pre Score 25/26    Post Score 15/18           Core Components/Risk Factors/Patient Goals at Admission:  Personal Goals and Risk Factors at Admission - 01/30/24 1536       Core Components/Risk Factors/Patient Goals on Admission    Weight Management Yes;Weight Maintenance    Intervention Weight Management: Develop a combined nutrition and exercise program designed to reach desired caloric intake, while maintaining appropriate intake of nutrient and fiber, sodium and fats, and appropriate energy expenditure required for the weight goal.;Weight Management: Provide education  and appropriate resources to help participant work on and attain dietary goals.;Weight Management/Obesity: Establish reasonable short term and long term weight goals.    Admit Weight 138 lb (62.6 kg)    Goal Weight: Short Term 138 lb (62.6 kg)    Goal Weight: Long Term 138 lb (62.6 kg)    Expected Outcomes Weight Maintenance: Understanding of the daily nutrition guidelines, which includes 25-35% calories from fat, 7% or less cal from saturated fats, less than 200mg  cholesterol, less than 1.5gm of sodium, & 5 or more servings of fruits and vegetables daily;Understanding recommendations for meals to include 15-35% energy as protein, 25-35% energy from fat, 35-60% energy from carbohydrates, less than 200mg  of dietary cholesterol, 20-35 gm of total fiber daily;Understanding of distribution of calorie intake throughout the day with the consumption of 4-5 meals/snacks;Short Term: Continue to assess and modify interventions until short term weight is achieved;Long Term: Adherence to nutrition and physical activity/exercise program aimed toward attainment of established weight goal    Diabetes Yes    Intervention Provide education about signs/symptoms and action to take for hypo/hyperglycemia.;Provide education about proper nutrition, including hydration, and aerobic/resistive exercise prescription along with prescribed medications to achieve blood glucose in normal ranges: Fasting glucose 65-99 mg/dL    Expected Outcomes Short Term: Participant verbalizes understanding of the signs/symptoms and immediate care of hyper/hypoglycemia, proper foot care and importance of medication, aerobic/resistive exercise and nutrition plan for blood glucose control.;Long Term: Attainment of HbA1C < 7%.    Heart Failure Yes    Intervention Provide a combined exercise and nutrition program that is supplemented with education, support and counseling about heart failure. Directed toward relieving symptoms such as shortness of breath,  decreased exercise tolerance, and extremity edema.    Expected Outcomes Improve functional capacity of life;Short term: Attendance in program 2-3 days a week with increased exercise capacity. Reported lower sodium intake. Reported increased fruit and vegetable intake. Reports medication compliance.;Short term: Daily weights obtained and reported for increase. Utilizing diuretic protocols set by physician.;Long term: Adoption of self-care  skills and reduction of barriers for early signs and symptoms recognition and intervention leading to self-care maintenance.    Hypertension Yes    Intervention Provide education on lifestyle modifcations including regular physical activity/exercise, weight management, moderate sodium restriction and increased consumption of fresh fruit, vegetables, and low fat dairy, alcohol moderation, and smoking cessation.;Monitor prescription use compliance.    Expected Outcomes Short Term: Continued assessment and intervention until BP is < 140/71mm HG in hypertensive participants. < 130/46mm HG in hypertensive participants with diabetes, heart failure or chronic kidney disease.;Long Term: Maintenance of blood pressure at goal levels.    Lipids Yes    Intervention Provide education and support for participant on nutrition & aerobic/resistive exercise along with prescribed medications to achieve LDL 70mg , HDL >40mg .    Expected Outcomes Short Term: Participant states understanding of desired cholesterol values and is compliant with medications prescribed. Participant is following exercise prescription and nutrition guidelines.;Long Term: Cholesterol controlled with medications as prescribed, with individualized exercise RX and with personalized nutrition plan. Value goals: LDL < 70mg , HDL > 40 mg.          Education:Diabetes - Individual verbal and written instruction to review signs/symptoms of diabetes, desired ranges of glucose level fasting, after meals and with exercise.  Acknowledge that pre and post exercise glucose checks will be done for 3 sessions at entry of program. Flowsheet Row Pulmonary Rehab from 01/30/2024 in [facility] Cardiac and Pulmonary Rehab  Date 01/30/24  Educator MB  Instruction Review Code 1- Verbalizes Understanding    Know Your Numbers and Heart Failure: - Group verbal and visual instruction to discuss disease risk factors for cardiac and pulmonary disease and treatment options.  Reviews associated critical values for Overweight/Obesity, Hypertension, Cholesterol, and Diabetes.  Discusses basics of heart failure: signs/symptoms and treatments.  Introduces Heart Failure Zone chart for action plan for heart failure. Written material provided at class time.   Core Components/Risk Factors/Patient Goals Review:   Goals and Risk Factor Review     Row Name 03/26/24 9147 04/02/24 0749 05/12/24 0745 06/04/24 0749       Core Components/Risk Factors/Patient Goals Review   Personal Goals Review Weight Management/Obesity;Hypertension;Diabetes;Heart Failure Improve shortness of breath with ADL's Develop more efficient breathing techniques such as purse lipped breathing and diaphragmatic breathing and practicing self-pacing with activity. --    Review Erin Middleton weighed 136.8 lbs today and she feels like she is doing good with maintaining her weight. She checks her weight at home and monitors due to her heart failure. She checks her blood pressure a couple times a week and it has been normal. She checks her blood sugar regularly with a monitor and it has been really good. Spoke to patient about their shortness of breath and what they can do to improve. Patient has been informed of breathing techniques when starting the program. Patient is informed to tell staff if they have had any med changes and that certain meds they are taking or not taking can be causing shortness of breath. Diaphragmatic and PLB breathing explained and performed with patient. Patient has a better  understanding of how to do these exercises to help with breathing performance and relaxation. Patient performed breathing techniques adequately and to practice further at home. Erin Middleton has been working on her breathing techniques at home and using PLB at needed. She is able to cook and clean without getting short of breath. She is graduating [facility] and plans to continue exercise at home.    Expected Outcomes Short:  Continue monitoring weight, blood pressure, and blood sugar levels. Long: Continue to manage cardiovascular risk factors Short: Attend [facility] regularly to improve shortness of breath with ADL's. Long: maintain independence with ADL's Short: practice PLB and diaphragmatic breathing at home. Long: Use PLB and diaphragmatic breathing independently post [facility]. Short: Graduate [facility]. Long: maintain exercise independently.       Core Components/Risk Factors/Patient Goals at Discharge (Final Review):   Goals and Risk Factor Review - 06/04/24 0749       Core Components/Risk Factors/Patient Goals Review   Review Erin Middleton has been working on her breathing techniques at home and using PLB at needed. She is able to cook and clean without getting short of breath. She is graduating [facility] and plans to continue exercise at home.    Expected Outcomes Short: Graduate [facility]. Long: maintain exercise independently.          ITP Comments:  ITP Comments     Row Name 01/23/24 1058 01/30/24 1524 02/06/24 0807 02/27/24 0957 03/26/24 1446   ITP Comments Initial phone call completed. Diagnosis can be found in [facility] 7/9. EP Orientation scheduled for Wednesday 7/30 at 1:30p. Completed and gym orientation for respiratory care services. Initial ITP created and sent for review to Dr. Faud Aleskerov, Medical Director. First full day of exercise!  Patient was oriented to gym and equipment including functions, settings, policies, and procedures.  Patient's individual exercise prescription and treatment  plan were reviewed.  All starting workloads were established based on the results of the 6 minute walk test done at initial orientation visit.  The plan for exercise progression was also introduced and progression will be customized based on patient's performance and goals. 30 Day review completed. Medical Director ITP review done; changes made as directed and signed approval by Medical Director.New to Program. 30 Day review completed. Medical Director ITP review done; changes made as directed and signed approval by Medical Director.    Row Name 04/23/24 1000 05/21/24 1306 06/16/24 0818       ITP Comments 30 Day review completed. Medical Director ITP review done; changes made as directed and signed approval by Medical Director. 30 Day review completed. Medical Director ITP review done, changes made as directed, and signed approval by Medical Director. Erin Middleton graduated today from  rehab with 36 sessions completed.  Details of the patient's exercise prescription and what She needs to do in order to continue the prescription and progress were discussed with patient.  Patient was given a copy of prescription and goals.  Patient verbalized understanding. Erin Middleton plans to continue to exercise by walking at home.        Comments: discharge ITP     [1]  Current Outpatient Medications:    ACCU-CHEK GUIDE test strip, CHECK BLOOD SUGAR EVERY DAY, Disp: 100 strip, Rfl: 2   Accu-Chek Softclix Lancets lancets, CHECK BLOOD SUGAR ONCE DAILY, Disp: 100 each, Rfl: 12   amLODipine  (NORVASC ) 5 MG tablet, TAKE 1 TABLET BY MOUTH EVERY DAY IN THE EVENING (Patient not taking: Reported on 02/19/2024), Disp: 90 tablet, Rfl: 2   blood glucose meter kit and supplies, Dispense based on patient and insurance preference. Check blood sugar once a day (FOR ICD-10 E10.9, E11.9)., Disp: 1 each, Rfl: 0   Cholecalciferol  10 MCG (400 UNIT) CAPS, , Disp: , Rfl:    furosemide (LASIX) 40 MG tablet, Take 40 mg by mouth. (Patient taking  differently: Take 20 mg by mouth daily.), Disp: , Rfl:    metFORMIN  (GLUCOPHAGE ) 500  MG tablet, Take 1 tablet (500 mg total) by mouth 2 (two) times daily with a meal., Disp: 180 tablet, Rfl: 1   Multiple Vitamins-Minerals (PRESERVISION/LUTEIN ) CAPS, Take 1 capsule by mouth at bedtime., Disp: , Rfl:    omeprazole  (PRILOSEC) 20 MG capsule, Take 20 mg by mouth., Disp: , Rfl:    rosuvastatin  (CRESTOR ) 40 MG tablet, Take 1 tablet by mouth at bedtime., Disp: , Rfl:    spironolactone (ALDACTONE) 25 MG tablet, Take 25 mg by mouth daily. (Patient not taking: Reported on 02/19/2024), Disp: , Rfl:    telmisartan (MICARDIS) 80 MG tablet, TAKE 1 TABLET BY MOUTH EVERY DAY (Patient not taking: Reported on 02/19/2024), Disp: 90 tablet, Rfl: 3   vitamin E  400 UNIT capsule, Take 400 Units by mouth daily. Reported on 09/15/2015, Disp: , Rfl:  [2]  Social History Tobacco Use  Smoking Status Never  Smokeless Tobacco Never

## 2024-06-16 NOTE — Progress Notes (Signed)
 Daily Session Note  Patient Details  Name: Erin Middleton MRN: [id number] Date of Birth: 1946/03/13 Referring Provider:   Conrad Ports Pulmonary Rehab from 01/30/2024 in [facility] Cardiac and Pulmonary Rehab  Referring Provider Florencio Kava, MD    Encounter Date: 06/16/2024  Check In:  Session Check In - 06/16/24 0816       Check-In   Supervising physician immediately available to respond to emergencies See telemetry face sheet for immediately available ER MD    Location [facility]-Cardiac & Pulmonary Rehab    Staff Present Burnard Davenport RN,BSN,MPA;[facility] Dyane BS, ACSM CEP, Exercise Physiologist;Jason Elnor RDN,LDN    Virtual Visit No    Medication changes reported     No    Fall or balance concerns reported    No    Warm-up and Cool-down Performed on first and last piece of equipment    Resistance Training Performed Yes    VAD Patient? No    PAD/SET Patient? No      Pain Assessment   Currently in Pain? No/denies             Tobacco Use History[1]  Goals Met:  Proper associated with RPD/PD & O2 Sat Independence with exercise equipment Using PLB without cueing & demonstrates good technique Exercise tolerated well No report of concerns or symptoms today Strength training completed today  Goals Unmet:  Not Applicable  Comments:  Jaren graduated today from  rehab with 36 sessions completed.  Details of the patient's exercise prescription and what She needs to do in order to continue the prescription and progress were discussed with patient.  Patient was given a copy of prescription and goals.  Patient verbalized understanding. Sophiarose plans to continue to exercise by walking at home.    Dr. Oneil Pinal is Medical Director for [facility] Cardiac Rehabilitation.  Dr. Fuad Aleskerov is Medical Director for [facility] Pulmonary Rehabilitation.    [1]  Social History Tobacco Use  Smoking Status Never  Smokeless Tobacco Never

## 2024-06-18 ENCOUNTER — Encounter

## 2025-02-16 ENCOUNTER — Encounter (INDEPENDENT_AMBULATORY_CARE_PROVIDER_SITE_OTHER): Admitting: Ophthalmology
